# Patient Record
Sex: Female | Born: 1965
Health system: Southern US, Community
[De-identification: ages and names within clinical notes are randomized; demographics above are authoritative.]

## PROBLEM LIST (undated history)

## (undated) DIAGNOSIS — D649 Anemia, unspecified: Secondary | ICD-10-CM

## (undated) DIAGNOSIS — Z8489 Family history of other specified conditions: Secondary | ICD-10-CM

## (undated) DIAGNOSIS — K219 Gastro-esophageal reflux disease without esophagitis: Secondary | ICD-10-CM

## (undated) DIAGNOSIS — R87619 Unspecified abnormal cytological findings in specimens from cervix uteri: Secondary | ICD-10-CM

## (undated) DIAGNOSIS — Z8669 Personal history of other diseases of the nervous system and sense organs: Secondary | ICD-10-CM

## (undated) DIAGNOSIS — I10 Essential (primary) hypertension: Secondary | ICD-10-CM

## (undated) DIAGNOSIS — I2510 Atherosclerotic heart disease of native coronary artery without angina pectoris: Secondary | ICD-10-CM

## (undated) DIAGNOSIS — I213 ST elevation (STEMI) myocardial infarction of unspecified site: Secondary | ICD-10-CM

## (undated) DIAGNOSIS — Z860100 Personal history of colon polyps, unspecified: Secondary | ICD-10-CM

## (undated) DIAGNOSIS — E785 Hyperlipidemia, unspecified: Secondary | ICD-10-CM

## (undated) DIAGNOSIS — Z8619 Personal history of other infectious and parasitic diseases: Secondary | ICD-10-CM

## (undated) HISTORY — PX: TOE SURGERY: SHX1073

## (undated) HISTORY — DX: Personal history of colon polyps, unspecified: Z86.0100

## (undated) HISTORY — PX: SKIN LESION EXCISION: SHX2412

## (undated) HISTORY — DX: Unspecified abnormal cytological findings in specimens from cervix uteri: R87.619

## (undated) HISTORY — DX: Personal history of other infectious and parasitic diseases: Z86.19

## (undated) HISTORY — DX: Essential (primary) hypertension: I10

## (undated) HISTORY — DX: Personal history of other diseases of the nervous system and sense organs: Z86.69

## (undated) HISTORY — DX: Personal history of colonic polyps: Z86.010

## (undated) HISTORY — PX: BREAST BIOPSY: SHX20

## (undated) HISTORY — DX: Hyperlipidemia, unspecified: E78.5

---

## 1987-12-27 DIAGNOSIS — Z8619 Personal history of other infectious and parasitic diseases: Secondary | ICD-10-CM

## 1987-12-27 HISTORY — DX: Personal history of other infectious and parasitic diseases: Z86.19

## 1999-08-31 ENCOUNTER — Other Ambulatory Visit: Admission: RE | Admit: 1999-08-31 | Discharge: 1999-08-31 | Payer: Self-pay | Admitting: Obstetrics and Gynecology

## 2000-09-18 ENCOUNTER — Other Ambulatory Visit: Admission: RE | Admit: 2000-09-18 | Discharge: 2000-09-18 | Payer: Self-pay | Admitting: Obstetrics and Gynecology

## 2000-12-15 ENCOUNTER — Other Ambulatory Visit: Admission: RE | Admit: 2000-12-15 | Discharge: 2000-12-15 | Payer: Self-pay | Admitting: Obstetrics and Gynecology

## 2001-01-11 ENCOUNTER — Encounter (INDEPENDENT_AMBULATORY_CARE_PROVIDER_SITE_OTHER): Payer: Self-pay | Admitting: Specialist

## 2001-01-11 ENCOUNTER — Other Ambulatory Visit: Admission: RE | Admit: 2001-01-11 | Discharge: 2001-01-11 | Payer: Self-pay | Admitting: Obstetrics and Gynecology

## 2001-09-18 ENCOUNTER — Other Ambulatory Visit: Admission: RE | Admit: 2001-09-18 | Discharge: 2001-09-18 | Payer: Self-pay | Admitting: Obstetrics and Gynecology

## 2002-06-04 ENCOUNTER — Other Ambulatory Visit: Admission: RE | Admit: 2002-06-04 | Discharge: 2002-06-04 | Payer: Self-pay | Admitting: Obstetrics and Gynecology

## 2002-09-23 ENCOUNTER — Other Ambulatory Visit: Admission: RE | Admit: 2002-09-23 | Discharge: 2002-09-23 | Payer: Self-pay | Admitting: Obstetrics and Gynecology

## 2003-04-18 ENCOUNTER — Other Ambulatory Visit: Admission: RE | Admit: 2003-04-18 | Discharge: 2003-04-18 | Payer: Self-pay | Admitting: Obstetrics and Gynecology

## 2003-09-26 ENCOUNTER — Other Ambulatory Visit: Admission: RE | Admit: 2003-09-26 | Discharge: 2003-09-26 | Payer: Self-pay | Admitting: Obstetrics and Gynecology

## 2004-10-04 ENCOUNTER — Other Ambulatory Visit: Admission: RE | Admit: 2004-10-04 | Discharge: 2004-10-04 | Payer: Self-pay | Admitting: Obstetrics and Gynecology

## 2005-12-16 ENCOUNTER — Other Ambulatory Visit: Admission: RE | Admit: 2005-12-16 | Discharge: 2005-12-16 | Payer: Self-pay | Admitting: Obstetrics and Gynecology

## 2007-03-23 ENCOUNTER — Other Ambulatory Visit: Admission: RE | Admit: 2007-03-23 | Discharge: 2007-03-23 | Payer: Self-pay | Admitting: Obstetrics and Gynecology

## 2008-02-18 ENCOUNTER — Other Ambulatory Visit: Admission: RE | Admit: 2008-02-18 | Discharge: 2008-02-18 | Payer: Self-pay | Admitting: Obstetrics and Gynecology

## 2008-02-29 ENCOUNTER — Ambulatory Visit (HOSPITAL_COMMUNITY): Admission: RE | Admit: 2008-02-29 | Discharge: 2008-02-29 | Payer: Self-pay | Admitting: Obstetrics and Gynecology

## 2008-08-13 ENCOUNTER — Ambulatory Visit (HOSPITAL_COMMUNITY): Admission: RE | Admit: 2008-08-13 | Discharge: 2008-08-13 | Payer: Self-pay | Admitting: Obstetrics and Gynecology

## 2009-02-26 ENCOUNTER — Other Ambulatory Visit: Admission: RE | Admit: 2009-02-26 | Discharge: 2009-02-26 | Payer: Self-pay | Admitting: Obstetrics and Gynecology

## 2009-08-14 ENCOUNTER — Ambulatory Visit (HOSPITAL_COMMUNITY): Admission: RE | Admit: 2009-08-14 | Discharge: 2009-08-14 | Payer: Self-pay | Admitting: Obstetrics and Gynecology

## 2009-11-10 ENCOUNTER — Ambulatory Visit (HOSPITAL_COMMUNITY): Admission: RE | Admit: 2009-11-10 | Discharge: 2009-11-10 | Payer: Self-pay | Admitting: Gastroenterology

## 2009-11-10 ENCOUNTER — Encounter (INDEPENDENT_AMBULATORY_CARE_PROVIDER_SITE_OTHER): Payer: Self-pay | Admitting: Gastroenterology

## 2010-03-05 LAB — HM PAP SMEAR: HM Pap smear: NEGATIVE

## 2010-09-03 ENCOUNTER — Ambulatory Visit (HOSPITAL_COMMUNITY): Admission: RE | Admit: 2010-09-03 | Discharge: 2010-09-03 | Payer: Self-pay | Admitting: Obstetrics and Gynecology

## 2011-10-04 ENCOUNTER — Other Ambulatory Visit: Payer: Self-pay | Admitting: Obstetrics and Gynecology

## 2011-10-04 DIAGNOSIS — Z1231 Encounter for screening mammogram for malignant neoplasm of breast: Secondary | ICD-10-CM

## 2011-10-21 ENCOUNTER — Ambulatory Visit (HOSPITAL_COMMUNITY)
Admission: RE | Admit: 2011-10-21 | Discharge: 2011-10-21 | Disposition: A | Discharge status: Home / Self Care | Payer: 59 | Source: Ambulatory Visit | Attending: Obstetrics and Gynecology | Admitting: Obstetrics and Gynecology

## 2011-10-21 DIAGNOSIS — Z1231 Encounter for screening mammogram for malignant neoplasm of breast: Secondary | ICD-10-CM | POA: Insufficient documentation

## 2012-01-27 HISTORY — PX: OTHER SURGICAL HISTORY: SHX169

## 2012-01-27 HISTORY — PX: HYSTEROSCOPY: SHX211

## 2012-02-01 ENCOUNTER — Encounter (HOSPITAL_COMMUNITY): Payer: Self-pay | Admitting: *Deleted

## 2012-02-06 ENCOUNTER — Encounter (HOSPITAL_COMMUNITY): Payer: Self-pay

## 2012-02-20 NOTE — H&P (Signed)
Carmen Edwards is an 46 y.o. female.   Chief Complaint: Heavy menses HPI: 46 yo [name] with menorrhagia, uncontrolled on POP (on POP rather that COC due to HTN) PUS/SHSG shows a 9 x 5 x 6 cm uterus with 3 fibroids, 2, 2, and 1 cm, but there is a 3 cm submucous fibroid shown on SHSG.  Pt was placed on Megace 20 mg po qd after the USG to try to control the bleeding until resection could be scheduled.  Past Medical History  Diagnosis Date  . Hypertension     Past Surgical History  Procedure Date  . Toe surgery     toe nail removed    No family history on file. Social History:  reports that she has been passively smoking.  She does not have any smokeless tobacco history on file. She reports that she does not drink alcohol or use illicit drugs.  Allergies:  Allergies  Allergen Reactions  . Amoxicillin Nausea And Vomiting    No current facility-administered medications on file as of .   No current outpatient prescriptions on file as of .    No results found for this or any previous visit (from the past 48 hour(s)). No results found.  Review of Systems  All other systems reviewed and are negative.    There were no vitals taken for this visit. Physical Exam  Constitutional: She is oriented to person, place, and time. She appears well-developed and well-nourished.  HENT:  Head: Normocephalic and atraumatic.  Eyes: Conjunctivae and EOM are normal.  Neck: Normal range of motion. Neck supple. No thyromegaly present.  Cardiovascular: Normal rate, regular rhythm and normal heart sounds.   Respiratory: Effort normal and breath sounds normal.  GI: Soft. Bowel sounds are normal.  Genitourinary: Vagina normal and uterus normal.       Uterus retroverted, NT, adnexa neg  Musculoskeletal: Normal range of motion.  Neurological: She is alert and oriented to person, place, and time.  Skin: Skin is warm and dry.  Psychiatric: She has a normal mood and affect.      Assessment/Plan Menorrhagia. Hysteroscopic resection of submucous fibroid, D & C   Carmen Edwards P 02/20/2012, 2:34 PM

## 2012-02-21 ENCOUNTER — Encounter (HOSPITAL_COMMUNITY): Payer: Self-pay | Admitting: Anesthesiology

## 2012-02-21 ENCOUNTER — Ambulatory Visit (HOSPITAL_COMMUNITY)
Admission: RE | Admit: 2012-02-21 | Discharge: 2012-02-21 | Disposition: A | Discharge status: Home / Self Care | Payer: 59 | Source: Ambulatory Visit | Attending: Obstetrics and Gynecology | Admitting: Obstetrics and Gynecology

## 2012-02-21 ENCOUNTER — Ambulatory Visit (HOSPITAL_COMMUNITY): Payer: 59 | Admitting: Anesthesiology

## 2012-02-21 ENCOUNTER — Encounter (HOSPITAL_COMMUNITY)
Admission: RE | Disposition: A | Discharge status: Home / Self Care | Payer: Self-pay | Source: Ambulatory Visit | Attending: Obstetrics and Gynecology

## 2012-02-21 ENCOUNTER — Encounter (HOSPITAL_COMMUNITY): Payer: Self-pay | Admitting: *Deleted

## 2012-02-21 DIAGNOSIS — N920 Excessive and frequent menstruation with regular cycle: Secondary | ICD-10-CM | POA: Insufficient documentation

## 2012-02-21 DIAGNOSIS — D250 Submucous leiomyoma of uterus: Secondary | ICD-10-CM | POA: Insufficient documentation

## 2012-02-21 LAB — BASIC METABOLIC PANEL
BUN: 8 mg/dL (ref 6–23)
Calcium: 9.5 mg/dL (ref 8.4–10.5)
Creatinine, Ser: 0.68 mg/dL (ref 0.50–1.10)
GFR calc non Af Amer: >90 mL/min (ref >90)
Glucose, Bld: 98 mg/dL (ref 70–99)

## 2012-02-21 LAB — CBC
HCT: 41.5 % (ref 36.0–46.0)
MCHC: 32.3 g/dL (ref 30.0–36.0)
MCV: 88.1 fL (ref 78.0–100.0)
RDW: 13.8 % (ref 11.5–15.5)

## 2012-02-21 SURGERY — DILATATION & CURETTAGE/HYSTEROSCOPY WITH VERSAPOINT RESECTION
Anesthesia: General | Site: Uterus | Wound class: Clean Contaminated

## 2012-02-21 MED ORDER — MORPHINE SULFATE 4 MG/ML IJ SOLN: 1.0000 mg | INTRAMUSCULAR | Status: DC | PRN

## 2012-02-21 MED ORDER — GLYCOPYRROLATE 0.2 MG/ML IJ SOLN
INTRAMUSCULAR | Status: AC
  Filled 2012-02-21: qty 1

## 2012-02-21 MED ORDER — ONDANSETRON HCL 4 MG/2ML IJ SOLN: 4.0000 mg | Freq: Four times a day (QID) | INTRAMUSCULAR | Status: DC | PRN

## 2012-02-21 MED ORDER — PROPOFOL 10 MG/ML IV EMUL
INTRAVENOUS | Status: DC | PRN
  Administered 2012-02-21: 200 mg via INTRAVENOUS

## 2012-02-21 MED ORDER — SODIUM CHLORIDE 0.9 % IJ SOLN: 3.0000 mL | Freq: Two times a day (BID) | INTRAMUSCULAR | Status: DC

## 2012-02-21 MED ORDER — SODIUM CHLORIDE 0.9 % IR SOLN
Status: DC | PRN
  Administered 2012-02-21: 12000 mL

## 2012-02-21 MED ORDER — FENTANYL CITRATE 0.05 MG/ML IJ SOLN
INTRAMUSCULAR | Status: AC
  Filled 2012-02-21: qty 2

## 2012-02-21 MED ORDER — LIDOCAINE HCL (CARDIAC) 20 MG/ML IV SOLN
INTRAVENOUS | Status: AC
  Filled 2012-02-21: qty 5

## 2012-02-21 MED ORDER — DEXAMETHASONE SODIUM PHOSPHATE 4 MG/ML IJ SOLN
INTRAMUSCULAR | Status: DC | PRN
  Administered 2012-02-21: 6 mg via INTRAVENOUS

## 2012-02-21 MED ORDER — ONDANSETRON HCL 4 MG/2ML IJ SOLN
INTRAMUSCULAR | Status: AC
  Filled 2012-02-21: qty 2

## 2012-02-21 MED ORDER — ACETAMINOPHEN 325 MG PO TABS: 650.0000 mg | ORAL_TABLET | ORAL | Status: DC | PRN

## 2012-02-21 MED ORDER — LIDOCAINE HCL (CARDIAC) 20 MG/ML IV SOLN
INTRAVENOUS | Status: DC | PRN
  Administered 2012-02-21: 80 mg via INTRAVENOUS

## 2012-02-21 MED ORDER — DEXAMETHASONE SODIUM PHOSPHATE 10 MG/ML IJ SOLN
INTRAMUSCULAR | Status: AC
  Filled 2012-02-21: qty 1

## 2012-02-21 MED ORDER — FENTANYL CITRATE 0.05 MG/ML IJ SOLN: 25.0000 ug | INTRAMUSCULAR | Status: DC | PRN

## 2012-02-21 MED ORDER — FENTANYL CITRATE 0.05 MG/ML IJ SOLN
INTRAMUSCULAR | Status: DC | PRN
  Administered 2012-02-21: 100 ug via INTRAVENOUS
  Administered 2012-02-21 (×4): 25 ug via INTRAVENOUS

## 2012-02-21 MED ORDER — MIDAZOLAM HCL 5 MG/5ML IJ SOLN
INTRAMUSCULAR | Status: DC | PRN
  Administered 2012-02-21: 2 mg via INTRAVENOUS

## 2012-02-21 MED ORDER — GLYCOPYRROLATE 0.2 MG/ML IJ SOLN
INTRAMUSCULAR | Status: DC | PRN
  Administered 2012-02-21: 0.2 mg via INTRAVENOUS

## 2012-02-21 MED ORDER — PROPOFOL 10 MG/ML IV EMUL
INTRAVENOUS | Status: AC
  Filled 2012-02-21: qty 20

## 2012-02-21 MED ORDER — LIDOCAINE HCL 1 % IJ SOLN
INTRAMUSCULAR | Status: DC | PRN
  Administered 2012-02-21: 20 mL

## 2012-02-21 MED ORDER — LACTATED RINGERS IV SOLN
INTRAVENOUS | Status: DC
  Administered 2012-02-21 (×3): via INTRAVENOUS

## 2012-02-21 MED ORDER — ACETAMINOPHEN 650 MG RE SUPP: 650.0000 mg | RECTAL | Status: DC | PRN

## 2012-02-21 MED ORDER — ONDANSETRON HCL 4 MG/2ML IJ SOLN
INTRAMUSCULAR | Status: DC | PRN
  Administered 2012-02-21: 4 mg via INTRAVENOUS

## 2012-02-21 MED ORDER — PROMETHAZINE HCL 25 MG/ML IJ SOLN: 12.5000 mg | Freq: Four times a day (QID) | INTRAMUSCULAR | Status: DC | PRN

## 2012-02-21 MED ORDER — KETOROLAC TROMETHAMINE 30 MG/ML IJ SOLN
INTRAMUSCULAR | Status: DC | PRN
  Administered 2012-02-21: 30 mg via INTRAVENOUS

## 2012-02-21 MED ORDER — MIDAZOLAM HCL 2 MG/2ML IJ SOLN
INTRAMUSCULAR | Status: AC
  Filled 2012-02-21: qty 2

## 2012-02-21 MED ORDER — OXYCODONE HCL 5 MG PO TABS: 5.0000 mg | ORAL_TABLET | ORAL | Status: DC | PRN

## 2012-02-21 MED ORDER — KETOROLAC TROMETHAMINE 30 MG/ML IJ SOLN
INTRAMUSCULAR | Status: AC
  Filled 2012-02-21: qty 1

## 2012-02-21 SURGICAL SUPPLY — 21 items
CANISTER SUCTION 2500CC (MISCELLANEOUS) ×6 IMPLANT
CATH ROBINSON RED A/P 16FR (CATHETERS) ×2 IMPLANT
CONTAINER PREFILL 10% NBF 60ML (FORM) ×4 IMPLANT
CORD ACTIVE DISPOSABLE (ELECTRODE)
CORD ELECTRO ACTIVE DISP (ELECTRODE) IMPLANT
ELECT LOOP GYNE PRO 24FR (CUTTING LOOP)
ELECT REM PT RETURN 9FT ADLT (ELECTROSURGICAL) ×2
ELECT VAPORTRODE GRVD BAR (ELECTRODE) IMPLANT
ELECTRODE LOOP GYNE PRO 24FR (CUTTING LOOP) IMPLANT
ELECTRODE REM PT RTRN 9FT ADLT (ELECTROSURGICAL) ×1 IMPLANT
ELECTRODE ROLLER VERSAPOINT (ELECTRODE) ×1 IMPLANT
ELECTRODE RT ANGLE VERSAPOINT (CUTTING LOOP) IMPLANT
GLOVE BIOGEL PI IND STRL 7.0 (GLOVE) ×2 IMPLANT
GLOVE BIOGEL PI INDICATOR 7.0 (GLOVE) ×2
GLOVE ECLIPSE 6.5 STRL STRAW (GLOVE) ×2 IMPLANT
GOWN PREVENTION PLUS LG XLONG (DISPOSABLE) ×2 IMPLANT
GOWN STRL REIN XL XLG (GOWN DISPOSABLE) ×2 IMPLANT
PACK HYSTEROSCOPY LF (CUSTOM PROCEDURE TRAY) ×2 IMPLANT
SUT CHROMIC 2 0 SH (SUTURE) ×1 IMPLANT
TOWEL OR 17X24 6PK STRL BLUE (TOWEL DISPOSABLE) ×4 IMPLANT
WATER STERILE IRR 1000ML POUR (IV SOLUTION) ×2 IMPLANT

## 2012-02-21 NOTE — Transfer of Care (Signed)
Immediate Anesthesia Transfer of Care Note  Patient: Carmen Edwards  Procedure(s) Performed: Procedure(s) (LRB): DILATATION & CURETTAGE/HYSTEROSCOPY WITH VERSAPOINT RESECTION (N/A)  Patient Location: PACU  Anesthesia Type: MAC and General  Level of Consciousness: alert  and oriented  Airway & Oxygen Therapy: Patient Spontanous Breathing and Patient connected to nasal cannula oxygen  Post-op Assessment: Report given to PACU RN and Post -op Vital signs reviewed and stable  Post vital signs: Reviewed  Complications: No apparent anesthesia complications

## 2012-02-21 NOTE — Anesthesia Procedure Notes (Signed)
Procedure Name: LMA Insertion Date/Time: 02/21/2012 10:33 AM Performed by: Carlyle Lipa Pre-anesthesia Checklist: Patient identified, Patient being monitored, Suction available and Emergency Drugs available Patient Re-evaluated:Patient Re-evaluated prior to inductionOxygen Delivery Method: Circle system utilized and Simple face mask Preoxygenation: Pre-oxygenation with 100% oxygen Intubation Type: IV induction Ventilation: Mask ventilation without difficulty Number of attempts: 1 Placement Confirmation: positive ETCO2 and breath sounds checked- equal and bilateral Tube secured with: Tape Dental Injury: Teeth and Oropharynx as per pre-operative assessment

## 2012-02-21 NOTE — Anesthesia Postprocedure Evaluation (Signed)
  Anesthesia Post-op Note  Patient: Carmen Edwards  Procedure(s) Performed: Procedure(s) (LRB): DILATATION & CURETTAGE/HYSTEROSCOPY WITH VERSAPOINT RESECTION (N/A)  Patient is awake and responsive. Pain and nausea are reasonably well controlled. Vital signs are stable and clinically acceptable. Oxygen saturation is clinically acceptable. There are no apparent anesthetic complications at this time. Patient is ready for discharge.

## 2012-02-21 NOTE — Anesthesia Preprocedure Evaluation (Addendum)
Anesthesia Evaluation    Airway       Dental   Pulmonary          Cardiovascular hypertension (well controlled, no CAD sx), On Home Beta Blockers     Neuro/Psych    GI/Hepatic   Endo/Other  Morbid obesity  Renal/GU      Musculoskeletal   Abdominal   Peds  Hematology   Anesthesia Other Findings   Reproductive/Obstetrics                          Anesthesia Physical Anesthesia Plan  ASA: III  Anesthesia Plan: General   Post-op Pain Management:    Induction: Intravenous  Airway Management Planned: LMA  Additional Equipment:   Intra-op Plan:   Post-operative Plan:   Informed Consent: I have reviewed the patients History and Physical, chart, labs and discussed the procedure including the risks, benefits and alternatives for the proposed anesthesia with the patient or authorized representative who has indicated his/her understanding and acceptance.   Dental Advisory Given  Plan Discussed with: CRNA and Surgeon  Anesthesia Plan Comments: (  Discussed  general anesthesia, including possible nausea, instrumentation of airway, sore throat,pulmonary aspiration, etc. I asked if the were any outstanding questions, or  concerns before we proceeded. )        Anesthesia Quick Evaluation

## 2012-02-21 NOTE — Interval H&P Note (Signed)
History and Physical Interval Note:  02/21/2012 10:11 AM  Carmen Edwards  has presented today for surgery, with the diagnosis of menorrhagia and fibroid  The various methods of treatment have been discussed with the patient and family. After consideration of risks, benefits and other options for treatment, the patient has consented to  Procedure(s) (LRB): DILATATION & CURETTAGE/HYSTEROSCOPY WITH VERSAPOINT RESECTION (N/A) as a surgical intervention .  The patients' history has been reviewed, patient examined, no change in status, stable for surgery.  I have reviewed the patients' chart and labs.  Questions were answered to the patient's satisfaction.     Lark Runk P

## 2012-02-21 NOTE — Op Note (Signed)
Preoperative diagnosis: Menorrhagia, submucous fibroid Postoperative diagnosis: Same, path pending Procedure: Hysteroscopic resection of submucous myoma, D&C Surgeon: Dr. Aram Beecham Romine Anesthesia: Gen. by LMA Estimated blood loss: 50 cc Estimated saline deficit: 650 cc Complications: None Procedure: The patient was taken to the operating room and after induction of adequate general anesthesia was placed in the dorsolithotomy position and prepped and draped in usual fashion.  The bladder was drained with a red rubber catheter.  A posterior weighted and anterior sims retractor were placed, and the cervix was grasped on its anterior lip with a single-tooth tenaculum.  Paracervical block was instituted by injecting 10 cc of 1% Xylocaine at each of 3 and 9:00.  The uterus sounded to 8 cm.  The cervix was dilated to #31 Shawnie Pons.  The VersaPoint hysteroscope with a desiccating attachment was introduced into the cavity.  There was a large submucous myoma practically filling the cavity.  It had measured approximately 3 cm on sonohysterogram.  The VersaPoint was used to desiccate approximately half of the fibroid.  At this point it was felt it would be preferable to treat remove the remainder of the fibroid with a regular cutting loop.  The hysteroscope was removed and a cauterizing and cutting loop was placed in the VersaPoint hysteroscope was reintroduced into the cavity.  A single loop cautery was used to remove the remainder of this large fibroid.  Photographic documentation was taken of the cavity after resection.  The scope was then withdrawn, and a gentle sharp curettage was done.  The specimen was sent separately to pathology.  The instruments were removed from the vagina.  There had been a 10 on the posterior lip of the cervix where I had applied a tenaculum, and this was sutured for hemostasis with a 2-0 chromic suture.  Hemostasis was obtained, and the instruments were all removed and the vagina.  The  procedure was terminated.  Sponge needle and instrument counts were correct x3.  The patient was taken to the recovery room in satisfactory condition.

## 2012-08-21 ENCOUNTER — Encounter (HOSPITAL_COMMUNITY): Payer: Self-pay

## 2012-08-21 ENCOUNTER — Emergency Department (HOSPITAL_COMMUNITY)
Admission: EM | Admit: 2012-08-21 | Discharge: 2012-08-21 | Disposition: A | Discharge status: Home / Self Care | Payer: 59 | Attending: Emergency Medicine | Admitting: Emergency Medicine

## 2012-08-21 ENCOUNTER — Emergency Department (HOSPITAL_COMMUNITY): Payer: 59

## 2012-08-21 DIAGNOSIS — E7800 Pure hypercholesterolemia, unspecified: Secondary | ICD-10-CM | POA: Insufficient documentation

## 2012-08-21 DIAGNOSIS — R079 Chest pain, unspecified: Secondary | ICD-10-CM | POA: Insufficient documentation

## 2012-08-21 DIAGNOSIS — F17200 Nicotine dependence, unspecified, uncomplicated: Secondary | ICD-10-CM | POA: Insufficient documentation

## 2012-08-21 DIAGNOSIS — I10 Essential (primary) hypertension: Secondary | ICD-10-CM | POA: Insufficient documentation

## 2012-08-21 LAB — COMPREHENSIVE METABOLIC PANEL
AST: 24 U/L (ref 0–37)
Albumin: 3.9 g/dL (ref 3.5–5.2)
Alkaline Phosphatase: 89 U/L (ref 39–117)
BUN: 8 mg/dL (ref 6–23)
CO2: 25 mEq/L (ref 19–32)
Sodium: 138 mEq/L (ref 135–145)
Total Bilirubin: 0.6 mg/dL (ref 0.3–1.2)
Total Protein: 7.2 g/dL (ref 6.0–8.3)

## 2012-08-21 LAB — CBC WITH DIFFERENTIAL/PLATELET
Eosinophils Absolute: 0.1 10*3/uL (ref 0.0–0.7)
Eosinophils Relative: 1 % (ref 0–5)
HCT: 43.1 % (ref 36.0–46.0)
Lymphocytes Relative: 18 % (ref 12–46)
Lymphs Abs: 2.1 10*3/uL (ref 0.7–4.0)
MCH: 31.1 pg (ref 26.0–34.0)
MCV: 90 fL (ref 78.0–100.0)
Monocytes Absolute: 1 10*3/uL (ref 0.1–1.0)
RBC: 4.79 MIL/uL (ref 3.87–5.11)
RDW: 13.5 % (ref 11.5–15.5)
WBC: 12.1 10*3/uL — ABNORMAL HIGH (ref 4.0–10.5)

## 2012-08-21 MED ORDER — KETOROLAC TROMETHAMINE 60 MG/2ML IM SOLN
60.0000 mg | Freq: Once | INTRAMUSCULAR | Status: AC
  Administered 2012-08-21: 60 mg via INTRAMUSCULAR
  Filled 2012-08-21: qty 2

## 2012-08-21 MED ORDER — GI COCKTAIL ~~LOC~~
30.0000 mL | Freq: Once | ORAL | Status: AC
  Administered 2012-08-21: 30 mL via ORAL
  Filled 2012-08-21: qty 30

## 2012-08-21 MED ORDER — TRAMADOL HCL 50 MG PO TABS: 50.0000 mg | ORAL_TABLET | Freq: Four times a day (QID) | ORAL | Status: AC | PRN

## 2012-08-21 NOTE — ED Notes (Signed)
Pt presents to department for evaluation of midsternal non radiating chest pain. Onset last night @ 8:00pm at home. States 3/10 dull constant pain at the time. Becomes worse when lying down. Also states cold sweats and difficulty sleeping. Denies SOB. Respirations unlabored. Lung sounds clear and equal bilaterally. She is conscious alert and oriented x4.

## 2012-08-21 NOTE — ED Notes (Signed)
Pt discharged home. Encouraged to follow up with PCP if symptoms persist. Had no further questions.

## 2012-08-21 NOTE — ED Provider Notes (Addendum)
History     CSN: [id number]  Arrival date & time 08/21/12  0801   First MD Initiated Contact with Patient 08/21/12 [id number]      Chief Complaint  Patient presents with  . Chest Pain    (Consider location/radiation/quality/duration/timing/severity/associated sxs/prior treatment) Patient is a 46 y.o. female presenting with chest pain. The history is provided by the patient.  Chest Pain Pertinent negatives for primary symptoms include no fever, no shortness of breath, no cough, no palpitations, no nausea and no vomiting.  Pertinent negatives for associated symptoms include no diaphoresis.    46 year old, female, with a history of hypertension, and hypercholesterolemia, presents to emergency department complaining of aching substernal chest pain.  Since yesterday.  Her symptoms began while she was sitting down.  Her symptoms have been constant since yesterday.  The pain does not radiate.  It is not associated with shortness of breath, nausea, vomiting, or sweating.  She denies cough, fevers, chills.  She denies leg pain or swelling.  She states when she walks.  She gets winded.  However, she denies chest pain with ambulation.  She denies change in the pain.  If she eats food.  She does feel as if the pain increases when she lies down and improves when she sits up. She does not smoke cigarettes.  Her father has had a bypass, and several heart attacks, when he was in his 35s.  Past Medical History  Diagnosis Date  . Hypertension   . High cholesterol     Past Surgical History  Procedure Date  . Toe surgery     toe nail removed    No family history on file.  History  Substance Use Topics  . Smoking status: Passive Smoker  . Smokeless tobacco: Not on file  . Alcohol Use: No    OB History    Grav Para Term Preterm Abortions TAB SAB Ect Mult Living                  Review of Systems  Constitutional: Negative for fever, chills and diaphoresis.  HENT: Negative for neck pain.     Respiratory: Negative for cough, chest tightness and shortness of breath.   Cardiovascular: Positive for chest pain. Negative for palpitations and leg swelling.  Gastrointestinal: Negative for nausea and vomiting.  Neurological: Negative for headaches.  Psychiatric/Behavioral: Negative for confusion.  All other systems reviewed and are negative.    Allergies  Amoxicillin  Home Medications   Current Outpatient Rx  Name Route Sig Dispense Refill  . ATENOLOL 50 MG PO TABS Oral Take 50 mg by mouth daily.    . ATORVASTATIN CALCIUM 10 MG PO TABS Oral Take 5 mg by mouth daily.     . ADULT MULTIVITAMIN W/MINERALS CH Oral Take 1 tablet by mouth daily.    . NORETHINDRONE 0.35 MG PO TABS Oral Take 1 tablet by mouth daily.      BP 118/74  Pulse 62  Temp 98.1 F (36.7 C) (Oral)  Resp 18  SpO2 97%  LMP 08/04/2012  Physical Exam  Nursing note and vitals reviewed. Constitutional: She is oriented to person, place, and time. She appears well-developed and well-nourished. No distress.  HENT:  Head: Normocephalic and atraumatic.  Eyes: Conjunctivae are normal.  Neck: Normal range of motion. Neck supple.       No bruits  Cardiovascular: Normal rate and regular rhythm.   No murmur heard. Pulmonary/Chest: Effort normal and breath sounds normal. She has no rales.  Abdominal: Soft. Bowel sounds are normal. There is no tenderness. There is no guarding.  Musculoskeletal: Normal range of motion. She exhibits no edema.  Neurological: She is alert and oriented to person, place, and time.  Skin: Skin is warm and dry.  Psychiatric: She has a normal mood and affect. Thought content normal.    ED Course  Procedures (including critical care time) 46 year old, female, with hypertension, and hyper cholesterolemia, presents with an aching left-sided chest pain, which has been constant since yesterday.  She has no other symptoms associated with the pain does increase when she lies down and improves when  she sits up.  Her physical examination is unremarkable.  We will perform a chest x-ray, and laboratory testing, and EKG, for evaluation.  Labs Reviewed  CBC WITH DIFFERENTIAL - Abnormal; Notable for the following:    WBC 12.1 (*)     Platelets 421 (*)     Neutro Abs 8.8 (*)     All other components within normal limits  COMPREHENSIVE METABOLIC PANEL  POCT I-STAT TROPONIN I   Dg Chest 2 View  08/21/2012  *RADIOLOGY REPORT*  Clinical Data: Shortness of breath, chest pain  CHEST - 2 VIEW  Comparison: None.  Findings: Cardiomediastinal silhouette is within normal limits. The lungs are clear. No pleural effusion.  No pneumothorax.  No acute osseous abnormality.  IMPRESSION: Normal chest.   Original Report Authenticated By: Harrel Lemon, M.D.      No diagnosis found.   Date: 08/21/2012  Rate: 77  Rhythm: normal sinus rhythm  QRS Axis: normal  Intervals: normal  ST/T Wave abnormalities: normal  Conduction Disutrbances: none  Narrative Interpretation: unremarkable  10:50 AM Feels better. Cp resolved.  She says it was dissipating even before she got the meds.   MDM  Chest pain        Cheri Guppy, MD 08/21/12 1610  Cheri Guppy, MD 08/21/12 1051

## 2012-08-21 NOTE — ED Notes (Signed)
Pt here with chest pain, onset last pm at 2000, sts dull and constant, sub sternal, no releiving or worsening factors. Pt denies n/v, sob or weakness, sts did have cold sweats. Family has heart hx.

## 2012-11-29 ENCOUNTER — Other Ambulatory Visit: Payer: Self-pay | Admitting: Obstetrics and Gynecology

## 2012-11-29 DIAGNOSIS — Z1231 Encounter for screening mammogram for malignant neoplasm of breast: Secondary | ICD-10-CM

## 2012-12-20 ENCOUNTER — Ambulatory Visit (HOSPITAL_COMMUNITY)
Admission: RE | Admit: 2012-12-20 | Discharge: 2012-12-20 | Disposition: A | Discharge status: Home / Self Care | Payer: 59 | Source: Ambulatory Visit | Attending: Obstetrics and Gynecology | Admitting: Obstetrics and Gynecology

## 2012-12-20 DIAGNOSIS — Z1231 Encounter for screening mammogram for malignant neoplasm of breast: Secondary | ICD-10-CM | POA: Insufficient documentation

## 2012-12-26 LAB — HM COLONOSCOPY

## 2013-04-01 ENCOUNTER — Ambulatory Visit: Payer: Self-pay | Admitting: Obstetrics and Gynecology

## 2013-04-01 ENCOUNTER — Encounter: Payer: Self-pay | Admitting: Obstetrics and Gynecology

## 2013-04-01 ENCOUNTER — Ambulatory Visit (INDEPENDENT_AMBULATORY_CARE_PROVIDER_SITE_OTHER): Payer: 59 | Admitting: Obstetrics and Gynecology

## 2013-04-01 VITALS — BP 112/60 | Ht 64.75 in | Wt 216.0 lb

## 2013-04-01 DIAGNOSIS — Z0000 Encounter for general adult medical examination without abnormal findings: Secondary | ICD-10-CM

## 2013-04-01 DIAGNOSIS — Z01419 Encounter for gynecological examination (general) (routine) without abnormal findings: Secondary | ICD-10-CM

## 2013-04-01 LAB — POCT URINALYSIS DIPSTICK
Bilirubin, UA: NEGATIVE
Glucose, UA: NEGATIVE
Leukocytes, UA: NEGATIVE
Nitrite, UA: NEGATIVE
Urobilinogen, UA: NEGATIVE

## 2013-04-01 MED ORDER — NORETHINDRONE 0.35 MG PO TABS: 1.0000 | ORAL_TABLET | Freq: Every day | ORAL | Status: DC

## 2013-04-01 NOTE — Patient Instructions (Signed)

## 2013-04-01 NOTE — Progress Notes (Signed)
47 y.o.  Married  Caucasian female   G2P1011 here for annual exam.   Menses light to none.  Still on POP's.  No cramping or heavy bleeding. No headaches.    Patient's last menstrual period was 03/15/2013.          Sexually active: yes  The current method of family planning is OCP (estrogen/progesterone).    Exercising: no Last mammogram:  12/20/12 normal Last pap smear: 03/09/10 neg History of abnormal pap: Yes CIN 2 in 1988 Smoking:yes light smoker, maybe 1/2 pack a year Alcohol:no Last colonoscopy:12/26/2008 removed polyps, recheck in 5years Last Bone Density:  none Last tetanus shot: less than 10years Last cholesterol check: 2013 elevated taking lipitor 5mg  daily  Hgb:    pcp            Urine :neg    Health Maintenance  Topic Date Due  . Pap Smear  04/24/1986  . Tetanus/tdap  04/25/1987  . Influenza Vaccine  08/26/2013    Family History  Problem Relation Age of Onset  . Cancer - Colon Father   . Hypertension Father   . Hyperlipidemia Father   . Heart disease Father   . Hyperlipidemia Mother   . Cancer - Colon Maternal Grandmother   . Heart disease Paternal Grandfather     There is no problem list on file for this patient.   Past Medical History  Diagnosis Date  . Hypertension   . High cholesterol   . Migraine   . Hx of chlamydia infection 1989    Past Surgical History  Procedure Laterality Date  . Toe surgery      toe nail removed  . Hysteroscopy  01/2012    Dr. Tresa Res  . Fibroid resection  01/2012    Dr. Tresa Res    Allergies: Amoxicillin  Current Outpatient Prescriptions  Medication Sig Dispense Refill  . atenolol (TENORMIN) 50 MG tablet Take 50 mg by mouth daily.      Marland Kitchen atorvastatin (LIPITOR) 10 MG tablet Take 5 mg by mouth daily.       . Multiple Vitamin (MULTIVITAMIN WITH MINERALS) TABS Take 1 tablet by mouth once a week.       . norethindrone (JOLIVETTE) 0.35 MG tablet Take 1 tablet by mouth daily.      . Iron TABS Take 1 capsule by mouth daily.        No current facility-administered medications for this visit.    ROS: Pertinent items are noted in HPI.  Exam:    BP 112/60  Ht 5' 4.75" (1.645 m)  Wt 216 lb (97.977 kg)  BMI 36.21 kg/m2  LMP 03/15/2013  Up 11 pounds from last year  Wt Readings from Last 3 Encounters:  04/01/13 216 lb (97.977 kg)  02/21/12 205 lb (92.987 kg)  02/21/12 205 lb (92.987 kg)     Ht Readings from Last 3 Encounters:  04/01/13 5' 4.75" (1.645 m)  02/21/12 5\' 5"  (1.651 m)  02/21/12 5\' 5"  (1.651 m)    General appearance: alert, cooperative and appears stated age Head: Normocephalic, without obvious abnormality, atraumatic Neck: no adenopathy, supple, symmetrical, trachea midline and thyroid not enlarged, symmetric, no tenderness/mass/nodules Lungs: clear to auscultation bilaterally Breasts: Inspection negative, No nipple retraction or dimpling, No nipple discharge or bleeding, No axillary or supraclavicular adenopathy, Normal to palpation without dominant masses Heart: regular rate and rhythm Abdomen: soft, non-tender; bowel sounds normal; no masses,  no organomegaly Extremities: extremities normal, atraumatic, no cyanosis or edema Skin: Skin color,  texture, turgor normal. No rashes or lesions Lymph nodes: Cervical, supraclavicular, and axillary nodes normal. No abnormal inguinal nodes palpated Neurologic: Grossly normal   Pelvic: External genitalia:  no lesions              Urethra:  normal appearing urethra with no masses, tenderness or lesions              Bartholins and Skenes: normal                 Vagina: normal appearing vagina with normal color and discharge, no lesions              Cervix: normal appearance              Pap taken: yes        Bimanual Exam:  Uterus:  uterus is normal size, shape, consistency and nontender                                      Adnexa: normal adnexa in size, nontender and no masses                                      Rectovaginal: Confirms                                       Anus:  normal sphincter tone, no lesions  A: normal gyn exam, POP's d/t HTN     S/p resection submucous myoma 3 cm March 2013     Migraines     H/O CIN 2 in 1988     H/O chlamydia 1989     Thyroid USG in 2009 done d/t clinically enlarged, but USG wnl     P: mammogram pap smear counseled on breast self exam, mammography screening, use and side effects of OCP's, adequate intake of calcium and vitamin D, diet and exercise return annually or prn     An After Visit Summary was printed and given to the patient.

## 2013-07-23 ENCOUNTER — Other Ambulatory Visit (HOSPITAL_COMMUNITY): Payer: Self-pay | Admitting: Gastroenterology

## 2013-07-23 DIAGNOSIS — R1310 Dysphagia, unspecified: Secondary | ICD-10-CM

## 2013-07-25 ENCOUNTER — Other Ambulatory Visit (HOSPITAL_COMMUNITY): Payer: 59

## 2013-07-26 ENCOUNTER — Ambulatory Visit (HOSPITAL_COMMUNITY)
Admission: RE | Admit: 2013-07-26 | Discharge: 2013-07-26 | Disposition: A | Discharge status: Home / Self Care | Payer: 59 | Source: Ambulatory Visit | Attending: Gastroenterology | Admitting: Gastroenterology

## 2013-07-26 DIAGNOSIS — R1310 Dysphagia, unspecified: Secondary | ICD-10-CM

## 2013-07-26 DIAGNOSIS — K219 Gastro-esophageal reflux disease without esophagitis: Secondary | ICD-10-CM | POA: Insufficient documentation

## 2013-07-26 DIAGNOSIS — K449 Diaphragmatic hernia without obstruction or gangrene: Secondary | ICD-10-CM | POA: Insufficient documentation

## 2014-03-18 ENCOUNTER — Other Ambulatory Visit: Payer: Self-pay | Admitting: Obstetrics and Gynecology

## 2014-03-18 NOTE — Telephone Encounter (Signed)
Last AEX 04/01/2013 Last refill 04/01/2013 #90/3 refills Next appt 04/14/2014  Will refill once until appt 04/14/2014.

## 2014-04-14 ENCOUNTER — Ambulatory Visit: Payer: 59 | Admitting: Obstetrics and Gynecology

## 2014-04-18 ENCOUNTER — Telehealth: Payer: Self-pay | Admitting: Obstetrics and Gynecology

## 2014-04-18 NOTE — Telephone Encounter (Signed)
Confirming patients appt

## 2014-04-21 ENCOUNTER — Telehealth: Payer: Self-pay | Admitting: Obstetrics and Gynecology

## 2014-04-21 ENCOUNTER — Other Ambulatory Visit: Payer: Self-pay | Admitting: Obstetrics and Gynecology

## 2014-04-21 DIAGNOSIS — Z1231 Encounter for screening mammogram for malignant neoplasm of breast: Secondary | ICD-10-CM

## 2014-04-21 MED ORDER — NORETHINDRONE 0.35 MG PO TABS: 1.0000 | ORAL_TABLET | Freq: Every day | ORAL | Status: DC

## 2014-04-21 NOTE — Telephone Encounter (Addendum)
Last AEX 04/01/2013 Last refill 02/2014 #28/0 refills  -Rx sent for 3 months. - LM for pt re: Rx sent to pharmacy. Will need to come to her appt for any refills.

## 2014-04-21 NOTE — Telephone Encounter (Signed)
Patient has aex 04/25/14 at 3:00. She is worried that she will not be able to get her rx before they close at 5:30. She will run out on Saturday. Wants to know if we will go ahead an refill the rx for 90 days because it cost her more when getting it 30 days at a time. [facility] and they are not open on weekends.  HEATHER 0.35 MG tablet  TAKE 1 TABLET BY MOUTH DAILY., Normal, Last Dose: Not Recorded  Refills: 0 ordered [facility]: [facility] [place].

## 2014-04-25 ENCOUNTER — Encounter: Payer: Self-pay | Admitting: Obstetrics and Gynecology

## 2014-04-25 ENCOUNTER — Ambulatory Visit (INDEPENDENT_AMBULATORY_CARE_PROVIDER_SITE_OTHER): Payer: 59 | Admitting: Obstetrics and Gynecology

## 2014-04-25 VITALS — BP 118/64 | HR 66 | Ht 65.0 in | Wt 223.2 lb

## 2014-04-25 DIAGNOSIS — Z01419 Encounter for gynecological examination (general) (routine) without abnormal findings: Secondary | ICD-10-CM

## 2014-04-25 DIAGNOSIS — Z0000 Encounter for general adult medical examination without abnormal findings: Secondary | ICD-10-CM

## 2014-04-25 LAB — POCT URINALYSIS DIPSTICK
BILIRUBIN UA: NEGATIVE
Glucose, UA: NEGATIVE
Ketones, UA: NEGATIVE
LEUKOCYTES UA: NEGATIVE
NITRITE UA: NEGATIVE
PH UA: 5
Protein, UA: NEGATIVE
Urobilinogen, UA: NEGATIVE

## 2014-04-25 MED ORDER — NORETHINDRONE 0.35 MG PO TABS: 1.0000 | ORAL_TABLET | Freq: Every day | ORAL | Status: DC

## 2014-04-25 NOTE — Patient Instructions (Signed)

## 2014-04-25 NOTE — Progress Notes (Signed)
Patient ID: Carmen Edwards, female   DOB: 11-16-1966, 48 y.o.   MRN: [id number] GYNECOLOGY VISIT  PCP:  Orpah Cobb, MD Carmen Edwards at [facility]  Referring provider:   HPI: 48 y.o.   Married  Caucasian  female   G2P1011 with Patient's last menstrual period was 04/24/2014.   here for   Annual exam.   Asking for pap .   Would like to stop birth control pills. Declines childbearing.  No problems with periods.   Weight watchers too tiresome.   Gaining weight.   Hgb:  PCP Urine: 2+ blood ( see LMP)   GYNECOLOGIC HISTORY: Patient's last menstrual period was 04/24/2014. Sexually active:  yes Partner preference: female Contraception: OCP's--Jolvette  Menopausal hormone therapy: n/a DES exposure:  no  Blood transfusions:   no Sexually transmitted diseases:  no  GYN procedures and prior surgeries:  Hysteroscopy w/fibroid resection 2013-Dr. Romine. Last mammogram:   11/2012 wnl:[facility].  Scheduled for May 06, 2014.      Last pap and high risk HPV testing:   04-01-13 ascus:neg HR HPV. History of abnormal pap smear:  Hx of CIN II 1988:patient unsure if had cryotherapy to cervix or conization?  LGSIL in 2003 on colposcopic biopsy.    OB History   Grav Para Term Preterm Abortions TAB SAB Ect Mult Living   2 1 1  1 1    1        LIFESTYLE: Exercise:   no            Tobacco:   Smokes 1/2 pack per year Alcohol:    no Drug use:  no  OTHER HEALTH MAINTENANCE: Tetanus/TDap:    Up to date with [facility] employee heatlth Gardisil:                n/a Influenza:               09/2013 Zostavax:                n/a  Bone density:          n/a Colonoscopy:          10/2009 revealed tubular adenomatous polyps.  Dr. Herbie Baltimore Buccini with [facility] recommends next colonoscopy in 5 years(10/2014).  Cholesterol check:  Borderline with PCP  Family History  Problem Relation Age of Onset  . Cancer - Colon Father   . Hypertension Father   . Hyperlipidemia Father   . Heart disease Father    . Hyperlipidemia Mother   . Cancer - Colon Maternal Grandmother   . Heart disease Paternal Grandfather   . Hypertension Brother   . Heart disease Brother 33    heart attack  . Cancer - Colon Maternal Aunt   . Cancer - Colon Maternal Uncle     There are no active problems to display for this patient.  Past Medical History  Diagnosis Date  . Hypertension   . High cholesterol   . Migraine   . Hx of chlamydia infection 1989  . Reflux     Past Surgical History  Procedure Laterality Date  . Toe surgery      toe nail removed  . Hysteroscopy  01/2012    Dr. Joan Flores  . Fibroid resection  01/2012    Dr. Joan Flores  . Skin lesion excision      --actually frozen by dermatology--benign    ALLERGIES: Amoxicillin  Current Outpatient Prescriptions  Medication Sig Dispense Refill  . atenolol (TENORMIN) 50 MG tablet  Take 50 mg by mouth daily.      Marland Kitchen atorvastatin (LIPITOR) 10 MG tablet Take 5 mg by mouth daily.       . norethindrone (HEATHER) 0.35 MG tablet Take 1 tablet (0.35 mg total) by mouth daily.  3 Package  0  . omeprazole (PRILOSEC) 20 MG capsule Take 20 mg by mouth daily.       No current facility-administered medications for this visit.     ROS:  Pertinent items are noted in HPI.  SOCIAL HISTORY:  Used to work for [employer]. Marketing rep now.  PHYSICAL EXAMINATION:    BP 118/64  Pulse 66  Ht 5\' 5"  (1.651 m)  Wt 223 lb 3.2 oz (101.243 kg)  BMI 37.14 kg/m2  LMP 04/24/2014   Wt Readings from Last 3 Encounters:  04/25/14 223 lb 3.2 oz (101.243 kg)  04/01/13 216 lb (97.977 kg)  02/21/12 205 lb (92.987 kg)     Ht Readings from Last 3 Encounters:  04/25/14 5\' 5"  (1.651 m)  04/01/13 5' 4.75" (1.645 m)  02/21/12 5\' 5"  (1.651 m)    General appearance: alert, cooperative and appears stated age Head: Normocephalic, without obvious abnormality, atraumatic Neck: no adenopathy, supple, symmetrical, trachea midline and thyroid not enlarged, symmetric, no  tenderness/mass/nodules Lungs: clear to auscultation bilaterally Breasts: Inspection negative, No nipple retraction or dimpling, No nipple discharge or bleeding, No axillary or supraclavicular adenopathy, Normal to palpation without dominant masses Heart: regular rate and rhythm Abdomen: soft, non-tender; no masses,  no organomegaly Extremities: extremities normal, atraumatic, no cyanosis or edema Skin: Skin color, texture, turgor normal. No rashes or lesions Lymph nodes: Cervical, supraclavicular, and axillary nodes normal. No abnormal inguinal nodes palpated Neurologic: Grossly normal  Pelvic: External genitalia:  no lesions              Urethra:  normal appearing urethra with no masses, tenderness or lesions              Bartholins and Skenes: normal                 Vagina: normal appearing vagina with normal color and discharge, no lesions              Cervix: normal appearance              Pap and high risk HPV testing done: yes.            Bimanual Exam:  Uterus:  uterus is normal size, shape, consistency and nontender                                      Adnexa: normal adnexa in size, nontender and no masses                                      Rectovaginal: Confirms                                      Anus:  normal sphincter tone, no lesions  ASSESSMENT  Normal gynecologic exam. Remote history of CIN II. History of ASCUS, negative HR HPV.  Periodic smoker.   PLAN  Mammogram recommended yearly.  Pap smear and high risk HPV testing performed.  Counseled on self  breast exam, Calcium and vitamin D intake, weight loss through diet and exercise. Refill on progesterone only OCP for one year.  Information to patient about Mirena IUD.  Return annually or prn   An After Visit Summary was printed and given to the patient.

## 2014-05-01 LAB — IPS PAP TEST WITH HPV

## 2014-05-05 ENCOUNTER — Telehealth: Payer: Self-pay | Admitting: Obstetrics and Gynecology

## 2014-05-05 DIAGNOSIS — IMO0002 Reserved for concepts with insufficient information to code with codable children: Secondary | ICD-10-CM

## 2014-05-05 NOTE — Telephone Encounter (Signed)
Left message for patient to call back. Need to go over benefits for colpo.

## 2014-05-05 NOTE — Telephone Encounter (Signed)
Message copied by Laqueta Due on Mon May 05, 2014  3:37 PM ------      Message from: Lowella Fairy      Created: Mon May 05, 2014 10:44 AM       Patient notified of abnormal pap and needs colposcopy.  Advised will have Sabrina precert procedure and she will call back with benefits and make appointment for colposcopy.  Note routed to [place]. ------

## 2014-05-06 ENCOUNTER — Ambulatory Visit (HOSPITAL_COMMUNITY)
Admission: RE | Admit: 2014-05-06 | Discharge: 2014-05-06 | Disposition: A | Discharge status: Home / Self Care | Payer: 59 | Source: Ambulatory Visit | Attending: Obstetrics and Gynecology | Admitting: Obstetrics and Gynecology

## 2014-05-06 DIAGNOSIS — Z1231 Encounter for screening mammogram for malignant neoplasm of breast: Secondary | ICD-10-CM | POA: Insufficient documentation

## 2014-05-06 NOTE — Telephone Encounter (Signed)
Patient returning Sabrina's call. °

## 2014-05-06 NOTE — Telephone Encounter (Signed)
Spoke with patient. Advised of $40 copay for colpo. Scheduled colpo. Mailed the In-Office procedure form that includes appointment date and time, patient copay, and cancellation policy.

## 2014-06-06 ENCOUNTER — Ambulatory Visit (INDEPENDENT_AMBULATORY_CARE_PROVIDER_SITE_OTHER): Payer: 59 | Admitting: Obstetrics and Gynecology

## 2014-06-06 ENCOUNTER — Encounter: Payer: Self-pay | Admitting: Obstetrics and Gynecology

## 2014-06-06 VITALS — BP 120/82 | HR 80 | Ht 65.0 in | Wt 235.2 lb

## 2014-06-06 DIAGNOSIS — R6889 Other general symptoms and signs: Secondary | ICD-10-CM

## 2014-06-06 DIAGNOSIS — IMO0002 Reserved for concepts with insufficient information to code with codable children: Secondary | ICD-10-CM

## 2014-06-06 DIAGNOSIS — R87610 Atypical squamous cells of undetermined significance on cytologic smear of cervix (ASC-US): Secondary | ICD-10-CM | POA: Insufficient documentation

## 2014-06-06 DIAGNOSIS — R87810 Cervical high risk human papillomavirus (HPV) DNA test positive: Secondary | ICD-10-CM

## 2014-06-06 NOTE — Patient Instructions (Signed)
Colposcopy Care After Colposcopy is a procedure in which a special tool is used to magnify the surface of the cervix. A tissue sample (biopsy) may also be taken. This sample will be looked at for cervical cancer or other problems. After the test:  You may have some cramping.  Lie down for a few minutes if you feel lightheaded.   You may have some bleeding which should stop in a few days. HOME CARE  Do not have sex or use tampons for 2 to 3 days or as told.  Only take medicine as told by your doctor.  Continue to take your birth control pills as usual. Finding out the results of your test Ask when your test results will be ready. Make sure you get your test results. GET HELP RIGHT AWAY IF:  You are bleeding a lot or are passing blood clots.  You develop a fever of 102 F (38.9 C) or higher.  You have abnormal vaginal discharge.  You have cramps that do not go away with medicine.  You feel lightheaded, dizzy, or pass out (faint). MAKE SURE YOU:   Understand these instructions.  Will watch your condition.  Will get help right away if you are not doing well or get worse. Document Released: 05/30/2008 Document Revised: 03/05/2012 Document Reviewed: 07/11/2013 ExitCare Patient Information 2014 [employer].  

## 2014-06-06 NOTE — Progress Notes (Signed)
Subjective:     Patient ID: Carmen Edwards, female   DOB: 04-11-1966, 48 y.o.   MRN: [id number]  HPI LMP 05/14/14 Contraception Jolivette.(progesterone only OCP).  Patient here for colposcopy. Pap on 04/25/14 showed ASCUS, endometrial cells, and positive HR HPV.  Bloody smear noted.  Patient's LMP was 04/24/14.  Prior pap and high risk HPV testing: 04-01-13 ascus:neg HR HPV.  History of abnormal pap smear: Hx of CIN II 1988:patient unsure if had cryotherapy to cervix or conization? LGSIL in 2003 on colposcopic biopsy.    Review of Systems     Objective:   Physical Exam  Genitourinary:           Colposcopy of the cervix, vagina, and vulva. Consent for procedure.  Speculum placed. Acetic acid to vagina and cervix.  Satisfactory colposcopy.  Small area of subtle punctations versus squamous metaplasia seen at 6 o'clock. ECC and biopsy of the cervix at 6 o'clock sent to pathology separately.  Monsel's applied to cervix.  No complications and minimal EBL.   Assessment:     ASCUS and positive HR HPV.    Plan:     Instructions/precautions given.  Contact patient with biopsy results. Anticipate pap and HPV testing in one year.     After visit summary to patient.

## 2014-06-10 LAB — IPS OTHER TISSUE BIOPSY

## 2014-10-20 ENCOUNTER — Telehealth: Payer: Self-pay | Admitting: Obstetrics and Gynecology

## 2014-10-20 NOTE — Telephone Encounter (Signed)
Dr cx/rs pt appt lmtcb to rs

## 2014-10-27 ENCOUNTER — Encounter: Payer: Self-pay | Admitting: Obstetrics and Gynecology

## 2014-11-13 ENCOUNTER — Encounter (HOSPITAL_COMMUNITY): Payer: Self-pay | Admitting: *Deleted

## 2014-11-28 ENCOUNTER — Encounter (HOSPITAL_COMMUNITY): Payer: Self-pay

## 2014-11-28 ENCOUNTER — Encounter (HOSPITAL_COMMUNITY)
Admission: RE | Disposition: A | Discharge status: Home / Self Care | Payer: 59 | Source: Ambulatory Visit | Attending: Gastroenterology

## 2014-11-28 ENCOUNTER — Ambulatory Visit (HOSPITAL_COMMUNITY): Payer: 59 | Admitting: Anesthesiology

## 2014-11-28 ENCOUNTER — Ambulatory Visit (HOSPITAL_COMMUNITY)
Admission: RE | Admit: 2014-11-28 | Discharge: 2014-11-28 | Disposition: A | Discharge status: Home / Self Care | Payer: 59 | Source: Ambulatory Visit | Attending: Gastroenterology | Admitting: Gastroenterology

## 2014-11-28 DIAGNOSIS — G43909 Migraine, unspecified, not intractable, without status migrainosus: Secondary | ICD-10-CM | POA: Insufficient documentation

## 2014-11-28 DIAGNOSIS — Z85038 Personal history of other malignant neoplasm of large intestine: Secondary | ICD-10-CM | POA: Diagnosis not present

## 2014-11-28 DIAGNOSIS — I10 Essential (primary) hypertension: Secondary | ICD-10-CM | POA: Insufficient documentation

## 2014-11-28 DIAGNOSIS — K635 Polyp of colon: Secondary | ICD-10-CM | POA: Diagnosis not present

## 2014-11-28 DIAGNOSIS — Z800 Family history of malignant neoplasm of digestive organs: Secondary | ICD-10-CM | POA: Diagnosis not present

## 2014-11-28 DIAGNOSIS — K219 Gastro-esophageal reflux disease without esophagitis: Secondary | ICD-10-CM | POA: Diagnosis not present

## 2014-11-28 DIAGNOSIS — Z1211 Encounter for screening for malignant neoplasm of colon: Secondary | ICD-10-CM | POA: Insufficient documentation

## 2014-11-28 DIAGNOSIS — F17210 Nicotine dependence, cigarettes, uncomplicated: Secondary | ICD-10-CM | POA: Insufficient documentation

## 2014-11-28 DIAGNOSIS — E780 Pure hypercholesterolemia: Secondary | ICD-10-CM | POA: Diagnosis not present

## 2014-11-28 HISTORY — DX: Family history of other specified conditions: Z84.89

## 2014-11-28 HISTORY — DX: Gastro-esophageal reflux disease without esophagitis: K21.9

## 2014-11-28 HISTORY — PX: COLONOSCOPY WITH PROPOFOL: SHX5780

## 2014-11-28 SURGERY — COLONOSCOPY WITH PROPOFOL
Anesthesia: Monitor Anesthesia Care

## 2014-11-28 MED ORDER — LIDOCAINE HCL (CARDIAC) 20 MG/ML IV SOLN
INTRAVENOUS | Status: AC
  Filled 2014-11-28: qty 5

## 2014-11-28 MED ORDER — LACTATED RINGERS IV SOLN: INTRAVENOUS | Status: DC

## 2014-11-28 MED ORDER — PROPOFOL 10 MG/ML IV BOLUS
INTRAVENOUS | Status: AC
  Filled 2014-11-28: qty 20

## 2014-11-28 SURGICAL SUPPLY — 22 items

## 2014-11-28 NOTE — Anesthesia Preprocedure Evaluation (Addendum)
Anesthesia Evaluation  Patient identified by MRN, date of birth, ID band Patient awake    Reviewed: Allergy & Precautions, H&P , NPO status , Patient's Chart, lab work & pertinent test results, reviewed documented beta blocker date and time   Airway Mallampati: II  TM Distance: >3 FB Neck ROM: full    Dental no notable dental hx.    Pulmonary neg pulmonary ROS, Current Smoker,  breath sounds clear to auscultation  Pulmonary exam normal       Cardiovascular Exercise Tolerance: Good hypertension, On Home Beta Blockers and Pt. on home beta blockers Rhythm:regular Rate:Normal     Neuro/Psych negative neurological ROS  negative psych ROS   GI/Hepatic negative GI ROS, Neg liver ROS, GERD-  Medicated and Controlled,  Endo/Other  negative endocrine ROSMorbid obesity  Renal/GU negative Renal ROS  negative genitourinary   Musculoskeletal   Abdominal (+) + obese,   Peds  Hematology negative hematology ROS (+)   Anesthesia Other Findings   Reproductive/Obstetrics negative OB ROS                            Anesthesia Physical Anesthesia Plan  ASA: III  Anesthesia Plan: MAC   Post-op Pain Management:    Induction:   Airway Management Planned:   Additional Equipment:   Intra-op Plan:   Post-operative Plan:   Informed Consent: I have reviewed the patients History and Physical, chart, labs and discussed the procedure including the risks, benefits and alternatives for the proposed anesthesia with the patient or authorized representative who has indicated his/her understanding and acceptance.   Dental Advisory Given  Plan Discussed with: CRNA and Surgeon  Anesthesia Plan Comments:        Anesthesia Quick Evaluation

## 2014-11-28 NOTE — Op Note (Signed)
[place]  COLONOSCOPY PROCEDURE NOTE ([place] not working at present)  Date:  November 28, 2014  Procedure: Colonoscopy with biopsy  Physician:  Cleotis Nipper, M.D.  Indication:  Strong family history of colon cancer (maternal grandmother, aunt and uncle; father); history of adenomatous polyp November 2010  Procedure:  Outpatient, PEG prep  Medication:  MAC  Scope:  Pentax adult colonoscope  Quality of prep:  Excellent  Extent reached:   Terminal ileum  Difficulty of exam:  Easy  Maneuvers required:  None  Findings:     Solitary diminutive polyp in ascending colon, completely excised by cold biopsy. Otherwise normal exam:  No masses, colitis, AVM's or diverticulosis. Retroflexion normal.  Patient tolerated well, no apparent complications.  IMPRESSION:    1. Solitary diminutive polyp 2. Strong family history of colon cancer.  PLAN:  1.  Await pathology results 2.  Genetic counselling has been recommended to the patient (for either her or her mother); she will give this some thought. 3.  Repeat colonoscopy in 5 years, or sooner if indicated by genetic counselling.  Cleotis Nipper, M.D. [phone]  Cc:  London Pepper, MD

## 2014-11-28 NOTE — H&P (Signed)
Carmen Edwards is an 48 y.o. female.   Chief Complaint: history of colon polyp, fam hx colon cancer  HPI: No complaints.  Colonoscopy 5 yrs ago showed a solitary diminutive adenomatous polyp  Past Medical History  Diagnosis Date  . Hypertension   . High cholesterol   . Hx of chlamydia infection 1989  . GERD (gastroesophageal reflux disease)   . Migraine     occasional menustral cycle related  . Family history of adverse reaction to anesthesia     mother nausea    Past Surgical History  Procedure Laterality Date  . Toe surgery      toe nail removed  . Hysteroscopy  01/2012    Dr. Joan Flores  . Fibroid resection  01/2012    Dr. Joan Flores  . Skin lesion excision      --actually frozen by dermatology--benign    Family History  Problem Relation Age of Onset  . Cancer - Colon Father   . Hypertension Father   . Hyperlipidemia Father   . Heart disease Father   . Hyperlipidemia Mother   . Cancer - Colon Maternal Grandmother   . Heart disease Paternal Grandfather   . Hypertension Brother   . Heart disease Brother 78    heart attack  . Cancer - Colon Maternal Aunt   . Cancer - Colon Maternal Uncle    Social History:  reports that she has been smoking Cigarettes.  She has been smoking about 0.00 packs per day. She has never used smokeless tobacco. She reports that she drinks alcohol. She reports that she does not use illicit drugs.  Allergies:  Allergies  Allergen Reactions  . Amoxicillin Nausea And Vomiting    (high doses)    Medications Prior to Admission  Medication Sig Dispense Refill  . atenolol (TENORMIN) 50 MG tablet Take 50 mg by mouth daily with lunch.     Marland Kitchen atorvastatin (LIPITOR) 10 MG tablet Take 5 mg by mouth at bedtime.     Marland Kitchen ibuprofen (ADVIL,MOTRIN) 200 MG tablet Take 400 mg by mouth every 6 (six) hours as needed for headache or moderate pain.    Marland Kitchen norethindrone (HEATHER) 0.35 MG tablet Take 1 tablet (0.35 mg total) by mouth daily. (Patient taking differently:  Take 1 tablet by mouth every evening. ) 3 Package 3  . omeprazole (PRILOSEC) 20 MG capsule Take 20 mg by mouth daily with lunch.       No results found for this or any previous visit (from the past 48 hour(s)). No results found.  ROS No lower GI sx  Blood pressure 148/83, pulse 62, temperature 98.1 F (36.7 C), temperature source Oral, resp. rate 13, height 5\' 6"  (1.676 m), weight 102.059 kg (225 lb), last menstrual period 11/24/2014, SpO2 99 %. Physical Exam NAD Chest clear    Heart nl  Abd slt adipose, no guarding, Mass, tenderness.  Neuro: nl MS and no overt focality  Assessment/Plan Stable, appropriate for screening colonoscopy.  Will use propofol sedn as before.  Izabella Marcantel V 11/28/2014, 9:16 AM

## 2014-11-28 NOTE — Addendum Note (Signed)
Addendum  created 11/28/14 1232 by Lollie Sails, CRNA   Modules edited: Anesthesia Events, Anesthesia Flowsheet, Anesthesia Responsible Staff

## 2014-11-28 NOTE — Discharge Instructions (Signed)

## 2014-11-29 ENCOUNTER — Encounter (HOSPITAL_COMMUNITY): Payer: Self-pay | Admitting: Gastroenterology

## 2014-12-08 MED FILL — Propofol IV Emul 10 MG/ML: INTRAVENOUS | Qty: 40 | Status: AC

## 2014-12-10 MED FILL — Lidocaine HCl IV Inj 20 MG/ML: INTRAVENOUS | Qty: 5 | Status: AC

## 2015-04-30 ENCOUNTER — Ambulatory Visit: Payer: 59 | Admitting: Obstetrics and Gynecology

## 2015-05-01 ENCOUNTER — Encounter: Payer: Self-pay | Admitting: Obstetrics and Gynecology

## 2015-05-01 ENCOUNTER — Ambulatory Visit (INDEPENDENT_AMBULATORY_CARE_PROVIDER_SITE_OTHER): Payer: 59 | Admitting: Obstetrics and Gynecology

## 2015-05-01 VITALS — BP 114/70 | HR 70 | Resp 16 | Ht 65.0 in | Wt 228.6 lb

## 2015-05-01 DIAGNOSIS — Z0000 Encounter for general adult medical examination without abnormal findings: Secondary | ICD-10-CM

## 2015-05-01 DIAGNOSIS — Z01419 Encounter for gynecological examination (general) (routine) without abnormal findings: Secondary | ICD-10-CM | POA: Diagnosis not present

## 2015-05-01 LAB — POCT URINALYSIS DIPSTICK
Bilirubin, UA: NEGATIVE
Blood, UA: NEGATIVE
Glucose, UA: NEGATIVE
KETONES UA: NEGATIVE
Leukocytes, UA: NEGATIVE
Nitrite, UA: NEGATIVE
Protein, UA: NEGATIVE
UROBILINOGEN UA: NEGATIVE
pH, UA: 5

## 2015-05-01 MED ORDER — NORETHINDRONE 0.35 MG PO TABS: 1.0000 | ORAL_TABLET | Freq: Every day | ORAL | Status: DC

## 2015-05-01 NOTE — Patient Instructions (Signed)

## 2015-05-01 NOTE — Progress Notes (Signed)
Patient ID: Carmen Edwards, female   DOB: 1966-05-08, 49 y.o.   MRN: [id number] 49 y.o. G68P1011 Married Caucasian female here for annual exam.    No much of a cycle on her OCPs.  Cycle is light and lasts a couple of days.  No pain. No missed or late pills.   Urinary incontinence.  Physical therapy helped in the past, but patient stopped doing exercises.  No bowel function problems.   New job for the last year.   Has heel pain.  Will need to see her PCP.   PCP:   London Pepper, MD  Patient's last menstrual period was 04/17/2015 (approximate).          Sexually active: Yes.   female partner The current method of family planning is oral progesterone-only contraceptive. - Micronor   Exercising: No.  none. Smoker:  Yes, smokes approximately 10 cigarettes/year.  Health Maintenance: Pap:  04-25-14 Ascus:Pos HR HPV History of abnormal Pap:  Yes, 06-06-14 colposcopy--LSIL; Hx of possible cryotherapy or conization of cervix 1998. MMG:  05-07-14 fibroglandular density/nl:[facility] Colonoscopy:  11/2014  adenomatous polyp with Dr. Cristina Gong.  Next due 12 2020 BMD:   n/a  Result  n/a TDaP:  Unsure.  She will check with [facility] as her prior vaccine administrator.  Screening Labs:  Hb today: PCP, Urine today: Neg   reports that she has been smoking Cigarettes.  She has been smoking about 0.00 packs per day. She has never used smokeless tobacco. She reports that she does not drink alcohol or use illicit drugs.  Past Medical History  Diagnosis Date  . Hypertension   . High cholesterol   . Hx of chlamydia infection 1989  . GERD (gastroesophageal reflux disease)   . Migraine     occasional menustral cycle related  . Family history of adverse reaction to anesthesia     mother nausea    Past Surgical History  Procedure Laterality Date  . Toe surgery      toe nail removed  . Hysteroscopy  01/2012    Dr. Joan Flores  . Fibroid resection  01/2012    Dr. Joan Flores  . Skin lesion excision     --actually frozen by dermatology--benign  . Colonoscopy with propofol N/A 11/28/2014    Procedure: COLONOSCOPY WITH PROPOFOL;  Surgeon: Cleotis Nipper, MD;  Location: [facility] ENDOSCOPY;  Service: Endoscopy;  Laterality: N/A;    Current Outpatient Prescriptions  Medication Sig Dispense Refill  . atenolol (TENORMIN) 50 MG tablet Take 50 mg by mouth daily with lunch.     Marland Kitchen atorvastatin (LIPITOR) 10 MG tablet Take 5 mg by mouth at bedtime.     Marland Kitchen ibuprofen (ADVIL,MOTRIN) 200 MG tablet Take 400 mg by mouth every 6 (six) hours as needed for headache or moderate pain.    Marland Kitchen norethindrone (HEATHER) 0.35 MG tablet Take 1 tablet (0.35 mg total) by mouth daily. (Patient taking differently: Take 1 tablet by mouth every evening. ) 3 Package 3  . omeprazole (PRILOSEC) 20 MG capsule Take 20 mg by mouth daily with lunch.      No current facility-administered medications for this visit.    Family History  Problem Relation Age of Onset  . Cancer - Colon Father   . Hypertension Father   . Hyperlipidemia Father   . Heart disease Father   . Hyperlipidemia Mother   . Cancer - Colon Maternal Grandmother   . Heart disease Paternal Grandfather   . Hypertension Brother   .  Heart disease Brother 49    heart attack  . Cancer - Colon Maternal Aunt   . Cancer - Colon Maternal Uncle     ROS:  Pertinent items are noted in HPI.  Otherwise, a comprehensive ROS was negative.  Exam:   BP 114/70 mmHg  Pulse 70  Resp 16  Ht 5\' 5"  (1.651 m)  Wt 228 lb 9.6 oz (103.692 kg)  BMI 38.04 kg/m2  LMP 04/17/2015 (Approximate)    General appearance: alert, cooperative and appears stated age Head: Normocephalic, without obvious abnormality, atraumatic Neck: no adenopathy, supple, symmetrical, trachea midline and thyroid normal to inspection and palpation Lungs: clear to auscultation bilaterally Breasts: normal appearance, no masses or tenderness, Inspection negative, No nipple retraction or dimpling, No nipple discharge or  bleeding, No axillary or supraclavicular adenopathy Heart: regular rate and rhythm Abdomen: soft, non-tender; bowel sounds normal; no masses,  no organomegaly Extremities: extremities normal, atraumatic, no cyanosis or edema Skin: Skin color, texture, turgor normal. No rashes or lesions Lymph nodes: Cervical, supraclavicular, and axillary nodes normal. No abnormal inguinal nodes palpated Neurologic: Grossly normal  Pelvic: External genitalia:  no lesions              Urethra:  normal appearing urethra with no masses, tenderness or lesions              Bartholins and Skenes: normal                 Vagina: normal appearing vagina with normal color and discharge, no lesions              Cervix: no lesions              Pap taken: Yes.   Bimanual Exam:  Uterus:  normal size, contour, position, consistency, mobility, non-tender              Adnexa: normal adnexa and no mass, fullness, tenderness              Rectovaginal: Yes.  .  Confirms.              Anus:  normal sphincter tone, no lesions  Chaperone was present for exam.  Assessment:   Well woman visit with normal exam. LGSIL of cervix.  Smoker over age 29.  FH colon cancer.   Plan: Yearly mammogram recommended after age 15.  Recommended self breast exam.  Pap and HR HPV as above. Discussed Calcium, Vitamin D, regular exercise program including cardiovascular and weight bearing exercise. Labs performed.  No. Refills given on medications.  Yes.  .  See orders.  Micronor for one year.  Discussed smoking cessation.  Follow up annually and prn.     After visit summary provided.

## 2015-05-06 LAB — IPS PAP TEST WITH HPV

## 2015-05-12 ENCOUNTER — Telehealth: Payer: Self-pay | Admitting: Emergency Medicine

## 2015-05-12 DIAGNOSIS — IMO0002 Reserved for concepts with insufficient information to code with codable children: Secondary | ICD-10-CM

## 2015-05-12 NOTE — Telephone Encounter (Signed)
Patient notified of message from Dr. Quincy Simmonds.  She is agreeable to scheduling colposcopy.    Colposcopy pre-procedure instructions given. Discussed menses and need to not have any bleeding on day of appointment, advised to call to reschedule if starts cycle.  Make sure to eat a meal and hydrate before appointment.  Advised 800 mg of Motrin with food one hour prior to appointment. Motrin/Advil or Ibuprofen. Take 800 mg (Can purchase over the counter, you will need four 200 mg pills).  Patient verbalized understanding of preprocedure instructions and cancellation policy and will call to reschedule if will be on menses or has any concerns regarding pregnancy.  Patient is advised she will be contacted with insurance coverage information.           [place] for insurance pre certification.    LMP 05/08/15, currently on progesterone only pills.

## 2015-05-12 NOTE — Telephone Encounter (Signed)
-----   Message from Nunzio Cobbs, MD sent at 05/07/2015 10:14 PM EDT ----- Please inform patient of pap showing ASCUS and positive HR HPV.  Time for colposcopy again.  Please let patient know and send to precert.   Thank you.  Cc - Marisa Sprinkles

## 2015-05-12 NOTE — Telephone Encounter (Signed)
Pre-cert complete

## 2015-05-22 ENCOUNTER — Encounter: Payer: Self-pay | Admitting: Obstetrics and Gynecology

## 2015-05-22 ENCOUNTER — Ambulatory Visit (INDEPENDENT_AMBULATORY_CARE_PROVIDER_SITE_OTHER): Payer: 59 | Admitting: Obstetrics and Gynecology

## 2015-05-22 DIAGNOSIS — R896 Abnormal cytological findings in specimens from other organs, systems and tissues: Secondary | ICD-10-CM | POA: Diagnosis not present

## 2015-05-22 DIAGNOSIS — IMO0002 Reserved for concepts with insufficient information to code with codable children: Secondary | ICD-10-CM

## 2015-05-22 NOTE — Progress Notes (Signed)
Subjective:     Patient ID: Carmen Edwards, female   DOB: 25-Apr-1966, 49 y.o.   MRN: [id number]  HPI  Patient is here today for colposcopy for pap showing ASCUS and positive HR HPV on 05/01/15.   Prior pap May 2015 - ASCUS and postitive HR HPV.  Colposcopy June 2015 - LGSIL. Hx of possible cryotherapy or conization in 1998.   LMP 05/15/15.  Contraception - Micronor.  Review of Systems    See HPI. Objective:   Physical Exam  Genitourinary:        Colposcopy Consent for procedure.  Speculum placed in vagina.  3% acetic acid placed.  Colposcopy satisfactory.  Biopsy of acetowhite change at 8:00.  Very small area involved. Tissue to pathology.  ECC performed.  Tissue to pathology.  Monsel's placed.  Minimal EBL.  No complications.    Assessment:     ASCUS and positive HR HPV. Hx of LGSIL on colpo biopsy.     Plan:     Colposcopy instructions/precautions given.  Discussion of HPV, cervical dysplasia, and treatment for cervical dysplasia with LEEP procedure when moderate - high grade dysplasia or persistent LGSIL is present.  Questions answered.  Follow up biopsies.  Will have at least cotesting in 12 months.   After visit summary to patient.

## 2015-05-22 NOTE — Patient Instructions (Signed)

## 2015-05-27 LAB — IPS OTHER TISSUE BIOPSY

## 2015-06-16 ENCOUNTER — Other Ambulatory Visit: Payer: Self-pay | Admitting: Obstetrics and Gynecology

## 2015-06-16 DIAGNOSIS — Z1231 Encounter for screening mammogram for malignant neoplasm of breast: Secondary | ICD-10-CM

## 2015-06-18 ENCOUNTER — Other Ambulatory Visit: Payer: Self-pay | Admitting: Obstetrics and Gynecology

## 2015-06-18 ENCOUNTER — Ambulatory Visit (HOSPITAL_COMMUNITY)
Admission: RE | Admit: 2015-06-18 | Discharge: 2015-06-18 | Disposition: A | Discharge status: Home / Self Care | Payer: 59 | Source: Ambulatory Visit | Attending: Obstetrics and Gynecology | Admitting: Obstetrics and Gynecology

## 2015-06-18 DIAGNOSIS — Z1231 Encounter for screening mammogram for malignant neoplasm of breast: Secondary | ICD-10-CM | POA: Diagnosis present

## 2015-06-23 ENCOUNTER — Other Ambulatory Visit: Payer: Self-pay | Admitting: Obstetrics and Gynecology

## 2015-06-23 DIAGNOSIS — R928 Other abnormal and inconclusive findings on diagnostic imaging of breast: Secondary | ICD-10-CM

## 2015-06-25 ENCOUNTER — Ambulatory Visit
Admission: RE | Admit: 2015-06-25 | Discharge: 2015-06-25 | Disposition: A | Discharge status: Home / Self Care | Payer: 59 | Source: Ambulatory Visit | Attending: Obstetrics and Gynecology | Admitting: Obstetrics and Gynecology

## 2015-06-25 DIAGNOSIS — R928 Other abnormal and inconclusive findings on diagnostic imaging of breast: Secondary | ICD-10-CM

## 2015-07-09 ENCOUNTER — Other Ambulatory Visit (HOSPITAL_COMMUNITY): Payer: Self-pay | Admitting: Family Medicine

## 2015-07-09 DIAGNOSIS — E049 Nontoxic goiter, unspecified: Secondary | ICD-10-CM

## 2015-07-10 ENCOUNTER — Other Ambulatory Visit (HOSPITAL_COMMUNITY): Payer: Self-pay | Admitting: Family Medicine

## 2015-07-10 DIAGNOSIS — E049 Nontoxic goiter, unspecified: Secondary | ICD-10-CM

## 2015-07-17 ENCOUNTER — Ambulatory Visit (HOSPITAL_COMMUNITY)
Admission: RE | Admit: 2015-07-17 | Discharge: 2015-07-17 | Disposition: A | Discharge status: Home / Self Care | Payer: 59 | Source: Ambulatory Visit | Attending: Family Medicine | Admitting: Family Medicine

## 2015-07-17 DIAGNOSIS — E049 Nontoxic goiter, unspecified: Secondary | ICD-10-CM | POA: Insufficient documentation

## 2016-01-19 ENCOUNTER — Telehealth: Payer: Self-pay | Admitting: Obstetrics and Gynecology

## 2016-01-19 NOTE — Telephone Encounter (Signed)
Medication refill request: Micronor Last AEX:  05/01/2015 Dr. Quincy Simmonds Next AEX: 05/06/2016 Dr. Quincy Simmonds Last MMG (if hormonal medication request): 06/25/2015 [facility] Category 3 Probably benign Refill authorized: 05/01/2015 #3 package 3 Refills  Refill to [facility] in [place] instead of [facility].  Today: #3 packs 1 Refill ? Please advise

## 2016-01-19 NOTE — Telephone Encounter (Signed)
Please let patient know that she is overdue for her mammogram follow up.  She is in mammogram recall for December 2016.

## 2016-01-19 NOTE — Telephone Encounter (Signed)
Patient calling about refill. I discussed with her that she is overdue for her Mammogram.  She said she had the paper to get it scheduled but hasn't scheduled anything yet.  She notes that she has 2 weeks left of Micronor.  She understands the instruction and will call back when she gets scheduled.

## 2016-01-20 ENCOUNTER — Other Ambulatory Visit: Payer: Self-pay | Admitting: Obstetrics and Gynecology

## 2016-01-20 ENCOUNTER — Other Ambulatory Visit: Payer: Self-pay

## 2016-01-20 DIAGNOSIS — N6489 Other specified disorders of breast: Secondary | ICD-10-CM

## 2016-01-20 MED ORDER — NORETHINDRONE 0.35 MG PO TABS: 1.0000 | ORAL_TABLET | Freq: Every day | ORAL | Status: DC

## 2016-01-20 NOTE — Telephone Encounter (Signed)
Patient is returning a call to [place].

## 2016-01-20 NOTE — Telephone Encounter (Signed)
OK for one month refill of Micronor to pharmacy of choice. Please send in.

## 2016-01-20 NOTE — Telephone Encounter (Signed)
Returned patients calland left a message on VM confirming she has a MMG appt scheduled.  I also stated I would send a message to Dr. Quincy Simmonds informing her that the patient is scheduled.  She may call back if she has any questions.  MMG Scheduled for  01/29/2016 Next AEX: 05/06/2016  Can we Fill #1 pack 0 Refills for Micronor until after her MMG?  Please advise?

## 2016-01-27 HISTORY — PX: BREAST SURGERY: SHX581

## 2016-01-29 ENCOUNTER — Ambulatory Visit
Admission: RE | Admit: 2016-01-29 | Discharge: 2016-01-29 | Disposition: A | Discharge status: Home / Self Care | Payer: Managed Care, Other (non HMO) | Source: Ambulatory Visit | Attending: Obstetrics and Gynecology | Admitting: Obstetrics and Gynecology

## 2016-01-29 ENCOUNTER — Other Ambulatory Visit: Payer: Self-pay | Admitting: Obstetrics and Gynecology

## 2016-01-29 DIAGNOSIS — N6489 Other specified disorders of breast: Secondary | ICD-10-CM

## 2016-02-03 ENCOUNTER — Ambulatory Visit
Admission: RE | Admit: 2016-02-03 | Discharge: 2016-02-03 | Disposition: A | Discharge status: Home / Self Care | Payer: Managed Care, Other (non HMO) | Source: Ambulatory Visit | Attending: Obstetrics and Gynecology | Admitting: Obstetrics and Gynecology

## 2016-02-03 DIAGNOSIS — N6489 Other specified disorders of breast: Secondary | ICD-10-CM

## 2016-02-16 ENCOUNTER — Other Ambulatory Visit: Payer: Self-pay | Admitting: Obstetrics and Gynecology

## 2016-02-16 NOTE — Telephone Encounter (Signed)
Patient has had her mammogram done and states the results should of been sent to Dr. Quincy Simmonds. She wants to discuss the results from the biopsy as well.

## 2016-02-16 NOTE — Telephone Encounter (Signed)
Medication refill request: OCP Last AEX:  05-01-15  Next AEX: 05-06-16 Last MMG (if hormonal medication request): 02-03-16 RT breast BX -WNL  return for diagnostic MMG in 6 mos Refill authorized: please advise

## 2016-02-16 NOTE — Telephone Encounter (Signed)
Spoke with patient. She is aware of biopsy results of Carmen Edwards in breast. She is out of mammogram hold and recall for 6 months.  She denies complaints.  Advised that refill of Micronor was placed to last her until next annual exam with Carmen Edwards in May.  Patient advised to continue self breast exams monthly and call back with any concerns.  Routing to provider for final review. Patient agreeable to disposition. Will close encounter.

## 2016-05-06 ENCOUNTER — Ambulatory Visit (INDEPENDENT_AMBULATORY_CARE_PROVIDER_SITE_OTHER): Payer: Managed Care, Other (non HMO) | Admitting: Obstetrics and Gynecology

## 2016-05-06 ENCOUNTER — Encounter: Payer: Self-pay | Admitting: Obstetrics and Gynecology

## 2016-05-06 VITALS — BP 130/84 | HR 70 | Resp 14 | Ht 64.5 in | Wt 226.4 lb

## 2016-05-06 DIAGNOSIS — Z01419 Encounter for gynecological examination (general) (routine) without abnormal findings: Secondary | ICD-10-CM | POA: Diagnosis not present

## 2016-05-06 MED ORDER — NORETHINDRONE 0.35 MG PO TABS: 1.0000 | ORAL_TABLET | Freq: Every day | ORAL | Status: DC

## 2016-05-06 MED ORDER — NYSTATIN-TRIAMCINOLONE 100000-0.1 UNIT/GM-% EX CREA: 1.0000 "application " | TOPICAL_CREAM | Freq: Two times a day (BID) | CUTANEOUS | Status: DC

## 2016-05-06 MED ORDER — FLUCONAZOLE 150 MG PO TABS: 150.0000 mg | ORAL_TABLET | Freq: Once | ORAL | Status: DC

## 2016-05-06 NOTE — Patient Instructions (Signed)
Health Maintenance, Female Adopting a healthy lifestyle and getting preventive care can go a long way to promote health and wellness. Talk with your health care provider about what schedule of regular examinations is right for you. This is a good chance for you to check in with your provider about disease prevention and staying healthy. In between checkups, there are plenty of things you can do on your own. Experts have done a lot of research about which lifestyle changes and preventive measures are most likely to keep you healthy. Ask your health care provider for more information. WEIGHT AND DIET  Eat a healthy diet  Be sure to include plenty of vegetables, fruits, low-fat dairy products, and lean protein.  Do not eat a lot of foods high in solid fats, added sugars, or salt.  Get regular exercise. This is one of the most important things you can do for your health.  Most adults should exercise for at least 150 minutes each week. The exercise should increase your heart rate and make you sweat (moderate-intensity exercise).  Most adults should also do strengthening exercises at least twice a week. This is in addition to the moderate-intensity exercise.  Maintain a healthy weight  Body mass index (BMI) is a measurement that can be used to identify possible weight problems. It estimates body fat based on height and weight. Your health care provider can help determine your BMI and help you achieve or maintain a healthy weight.  For females 20 years of age and older:   A BMI below 18.5 is considered underweight.  A BMI of 18.5 to 24.9 is normal.  A BMI of 25 to 29.9 is considered overweight.  A BMI of 30 and above is considered obese.  Watch levels of cholesterol and blood lipids  You should start having your blood tested for lipids and cholesterol at 48 years of age, then have this test every 5 years.  You may need to have your cholesterol levels checked more often if:  Your lipid  or cholesterol levels are high.  You are older than 48 years of age.  You are at high risk for heart disease.  CANCER SCREENING   Lung Cancer  Lung cancer screening is recommended for adults 55-80 years old who are at high risk for lung cancer because of a history of smoking.  A yearly low-dose CT scan of the lungs is recommended for people who:  Currently smoke.  Have quit within the past 15 years.  Have at least a 30-pack-year history of smoking. A pack year is smoking an average of one pack of cigarettes a day for 1 year.  Yearly screening should continue until it has been 15 years since you quit.  Yearly screening should stop if you develop a health problem that would prevent you from having lung cancer treatment.  Breast Cancer  Practice breast self-awareness. This means understanding how your breasts normally appear and feel.  It also means doing regular breast self-exams. Let your health care provider know about any changes, no matter how small.  If you are in your 20s or 30s, you should have a clinical breast exam (CBE) by a health care provider every 1-3 years as part of a regular health exam.  If you are 40 or older, have a CBE every year. Also consider having a breast X-ray (mammogram) every year.  If you have a family history of breast cancer, talk to your health care provider about genetic screening.  If you   are at high risk for breast cancer, talk to your health care provider about having an MRI and a mammogram every year.  Breast cancer gene (BRCA) assessment is recommended for women who have family members with BRCA-related cancers. BRCA-related cancers include:  Breast.  Ovarian.  Tubal.  Peritoneal cancers.  Results of the assessment will determine the need for genetic counseling and BRCA1 and BRCA2 testing. Cervical Cancer Your health care provider may recommend that you be screened regularly for cancer of the pelvic organs (ovaries, uterus, and  vagina). This screening involves a pelvic examination, including checking for microscopic changes to the surface of your cervix (Pap test). You may be encouraged to have this screening done every 3 years, beginning at age 21.  For women ages 30-65, health care providers may recommend pelvic exams and Pap testing every 3 years, or they may recommend the Pap and pelvic exam, combined with testing for human papilloma virus (HPV), every 5 years. Some types of HPV increase your risk of cervical cancer. Testing for HPV may also be done on women of any age with unclear Pap test results.  Other health care providers may not recommend any screening for nonpregnant women who are considered low risk for pelvic cancer and who do not have symptoms. Ask your health care provider if a screening pelvic exam is right for you.  If you have had past treatment for cervical cancer or a condition that could lead to cancer, you need Pap tests and screening for cancer for at least 20 years after your treatment. If Pap tests have been discontinued, your risk factors (such as having a new sexual partner) need to be reassessed to determine if screening should resume. Some women have medical problems that increase the chance of getting cervical cancer. In these cases, your health care provider may recommend more frequent screening and Pap tests. Colorectal Cancer  This type of cancer can be detected and often prevented.  Routine colorectal cancer screening usually begins at 48 years of age and continues through 48 years of age.  Your health care provider may recommend screening at an earlier age if you have risk factors for colon cancer.  Your health care provider may also recommend using home test kits to check for hidden blood in the stool.  A small camera at the end of a tube can be used to examine your colon directly (sigmoidoscopy or colonoscopy). This is done to check for the earliest forms of colorectal  cancer.  Routine screening usually begins at age 50.  Direct examination of the colon should be repeated every 5-10 years through 48 years of age. However, you may need to be screened more often if early forms of precancerous polyps or small growths are found. Skin Cancer  Check your skin from head to toe regularly.  Tell your health care provider about any new moles or changes in moles, especially if there is a change in a mole's shape or color.  Also tell your health care provider if you have a mole that is larger than the size of a pencil eraser.  Always use sunscreen. Apply sunscreen liberally and repeatedly throughout the day.  Protect yourself by wearing long sleeves, pants, a wide-brimmed hat, and sunglasses whenever you are outside. HEART DISEASE, DIABETES, AND HIGH BLOOD PRESSURE   High blood pressure causes heart disease and increases the risk of stroke. High blood pressure is more likely to develop in:  People who have blood pressure in the high end   of the normal range (130-139/85-89 mm Hg).  People who are overweight or obese.  People who are African American.  If you are 38-23 years of age, have your blood pressure checked every 3-5 years. If you are 61 years of age or older, have your blood pressure checked every year. You should have your blood pressure measured twice--once when you are at a hospital or clinic, and once when you are not at a hospital or clinic. Record the average of the two measurements. To check your blood pressure when you are not at a hospital or clinic, you can use:  An automated blood pressure machine at a pharmacy.  A home blood pressure monitor.  If you are between 45 years and 39 years old, ask your health care provider if you should take aspirin to prevent strokes.  Have regular diabetes screenings. This involves taking a blood sample to check your fasting blood sugar level.  If you are at a normal weight and have a low risk for diabetes,  have this test once every three years after 48 years of age.  If you are overweight and have a high risk for diabetes, consider being tested at a younger age or more often. PREVENTING INFECTION  Hepatitis B  If you have a higher risk for hepatitis B, you should be screened for this virus. You are considered at high risk for hepatitis B if:  You were born in a country where hepatitis B is common. Ask your health care provider which countries are considered high risk.  Your parents were born in a high-risk country, and you have not been immunized against hepatitis B (hepatitis B vaccine).  You have HIV or AIDS.  You use needles to inject street drugs.  You live with someone who has hepatitis B.  You have had sex with someone who has hepatitis B.  You get hemodialysis treatment.  You take certain medicines for conditions, including cancer, organ transplantation, and autoimmune conditions. Hepatitis C  Blood testing is recommended for:  Everyone born from [birth year] through 1963.  Anyone with known risk factors for hepatitis C. Sexually transmitted infections (STIs)  You should be screened for sexually transmitted infections (STIs) including gonorrhea and chlamydia if:  You are sexually active and are younger than 48 years of age.  You are older than 48 years of age and your health care provider tells you that you are at risk for this type of infection.  Your sexual activity has changed since you were last screened and you are at an increased risk for chlamydia or gonorrhea. Ask your health care provider if you are at risk.  If you do not have HIV, but are at risk, it may be recommended that you take a prescription medicine daily to prevent HIV infection. This is called pre-exposure prophylaxis (PrEP). You are considered at risk if:  You are sexually active and do not regularly use condoms or know the HIV status of your partner(s).  You take drugs by injection.  You are sexually  active with a partner who has HIV. Talk with your health care provider about whether you are at high risk of being infected with HIV. If you choose to begin PrEP, you should first be tested for HIV. You should then be tested every 3 months for as long as you are taking PrEP.  PREGNANCY   If you are premenopausal and you may become pregnant, ask your health care provider about preconception counseling.  If you may  become pregnant, take 400 to 800 micrograms (mcg) of folic acid every day.  If you want to prevent pregnancy, talk to your health care provider about birth control (contraception). OSTEOPOROSIS AND MENOPAUSE   Osteoporosis is a disease in which the bones lose minerals and strength with aging. This can result in serious bone fractures. Your risk for osteoporosis can be identified using a bone density scan.  If you are 2 years of age or older, or if you are at risk for osteoporosis and fractures, ask your health care provider if you should be screened.  Ask your health care provider whether you should take a calcium or vitamin D supplement to lower your risk for osteoporosis.  Menopause may have certain physical symptoms and risks.  Hormone replacement therapy may reduce some of these symptoms and risks. Talk to your health care provider about whether hormone replacement therapy is right for you.  HOME CARE INSTRUCTIONS   Schedule regular health, dental, and eye exams.  Stay current with your immunizations.   Do not use any tobacco products including cigarettes, chewing tobacco, or electronic cigarettes.  If you are pregnant, do not drink alcohol.  If you are breastfeeding, limit how much and how often you drink alcohol.  Limit alcohol intake to no more than 1 drink per day for nonpregnant women. One drink equals 12 ounces of beer, 5 ounces of wine, or 1 ounces of hard liquor.  Do not use street drugs.  Do not share needles.  Ask your health care provider for help if  you need support or information about quitting drugs.  Tell your health care provider if you often feel depressed.  Tell your health care provider if you have ever been abused or do not feel safe at home.   This information is not intended to replace advice given to you by your health care provider. Make sure you discuss any questions you have with your health care provider.   Document Released: 06/27/2011 Document Revised: 01/02/2015 Document Reviewed: 11/13/2013 Elsevier Interactive Patient Education 2016 [employer].  Exercising to [employer] Exercising can help you to lose weight. In order to lose weight through exercise, you need to do vigorous-intensity exercise. You can tell that you are exercising with vigorous intensity if you are breathing very hard and fast and cannot hold a conversation while exercising. Moderate-intensity exercise helps to maintain your current weight. You can tell that you are exercising at a moderate level if you have a higher heart rate and faster breathing, but you are still able to hold a conversation. HOW OFTEN SHOULD I EXERCISE? Choose an activity that you enjoy and set realistic goals. Your health care provider can help you to make an activity plan that works for you. Exercise regularly as directed by your health care provider. This may include:  Doing resistance training twice each week, such as:  Push-ups.  Sit-ups.  Lifting weights.  Using resistance bands.  Doing a given intensity of exercise for a given amount of time. Choose from these options:  150 minutes of moderate-intensity exercise every week.  75 minutes of vigorous-intensity exercise every week.  A mix of moderate-intensity and vigorous-intensity exercise every week. Children, pregnant women, people who are out of shape, people who are overweight, and older adults may need to consult a health care provider for individual recommendations. If you have any sort of medical  condition, be sure to consult your health care provider before starting a new exercise program. WHAT ARE SOME  ACTIVITIES THAT CAN HELP ME TO LOSE WEIGHT?   Walking at a rate of at least 4.5 miles an hour.  Jogging or running at a rate of 5 miles per hour.  Biking at a rate of at least 10 miles per hour.  Lap swimming.  Roller-skating or in-line skating.  Cross-country skiing.  Vigorous competitive sports, such as football, basketball, and soccer.  Jumping rope.  Aerobic dancing. HOW CAN I BE MORE ACTIVE IN MY DAY-TO-DAY ACTIVITIES?  Use the stairs instead of the elevator.  Take a walk during your lunch break.  If you drive, park your car farther away from work or school.  If you take public transportation, get off one stop early and walk the rest of the way.  Make all of your phone calls while standing up and walking around.  Get up, stretch, and walk around every 30 minutes throughout the day. WHAT GUIDELINES SHOULD I FOLLOW WHILE EXERCISING?  Do not exercise so much that you hurt yourself, feel dizzy, or get very short of breath.  Consult your health care provider prior to starting a new exercise program.  Wear comfortable clothes and shoes with good support.  Drink plenty of water while you exercise to prevent dehydration or heat stroke. Body water is lost during exercise and must be replaced.  Work out until you breathe faster and your heart beats faster.   This information is not intended to replace advice given to you by your health care provider. Make sure you discuss any questions you have with your health care provider.   Document Released: 01/14/2011 Document Revised: 01/02/2015 Document Reviewed: 05/15/2014 Elsevier Interactive Patient Education 2016 [employer].  Calorie Counting for [employer] Calories are energy you get from the things you eat and drink. Your body uses this energy to keep you going throughout the day. The number of calories you eat  affects your weight. When you eat more calories than your body needs, your body stores the extra calories as fat. When you eat fewer calories than your body needs, your body burns fat to get the energy it needs. Calorie counting means keeping track of how many calories you eat and drink each day. If you make sure to eat fewer calories than your body needs, you should lose weight. In order for calorie counting to work, you will need to eat the number of calories that are right for you in a day to lose a healthy amount of weight per week. A healthy amount of weight to lose per week is usually 1-2 lb (0.5-0.9 kg). A dietitian can determine how many calories you need in a day and give you suggestions on how to reach your calorie goal.  WHAT IS MY MY PLAN? My goal is to have __________ calories per day.  If I have this many calories per day, I should lose around __________ pounds per week. WHAT DO I NEED TO KNOW ABOUT CALORIE COUNTING? In order to meet your daily calorie goal, you will need to:  Find out how many calories are in each food you would like to eat. Try to do this before you eat.  Decide how much of the food you can eat.  Write down what you ate and how many calories it had. Doing this is called keeping a food log. WHERE DO I FIND CALORIE INFORMATION? The number of calories in a food can be found on a Nutrition Facts label. Note that all the information on a label is  based on a specific serving of the food. If a food does not have a Nutrition Facts label, try to look up the calories online or ask your dietitian for help. HOW DO I DECIDE HOW MUCH TO EAT? To decide how much of the food you can eat, you will need to consider both the number of calories in one serving and the size of one serving. This information can be found on the Nutrition Facts label. If a food does not have a Nutrition Facts label, look up the information online or ask your dietitian for help. Remember that calories are  listed per serving. If you choose to have more than one serving of a food, you will have to multiply the calories per serving by the amount of servings you plan to eat. For example, the label on a package of bread might say that a serving size is 1 slice and that there are 90 calories in a serving. If you eat 1 slice, you will have eaten 90 calories. If you eat 2 slices, you will have eaten 180 calories. HOW DO I KEEP A FOOD LOG? After each meal, record the following information in your food log:  What you ate.  How much of it you ate.  How many calories it had.  Then, add up your calories. Keep your food log near you, such as in a small notebook in your pocket. Another option is to use a mobile app or website. Some programs will calculate calories for you and show you how many calories you have left each time you add an item to the log. WHAT ARE SOME CALORIE COUNTING TIPS?  Use your calories on foods and drinks that will fill you up and not leave you hungry. Some examples of this include foods like nuts and nut butters, vegetables, lean proteins, and high-fiber foods (more than 5 g fiber per serving).  Eat nutritious foods and avoid empty calories. Empty calories are calories you get from foods or beverages that do not have many nutrients, such as candy and soda. It is better to have a nutritious high-calorie food (such as an avocado) than a food with few nutrients (such as a bag of chips).  Know how many calories are in the foods you eat most often. This way, you do not have to look up how many calories they have each time you eat them.  Look out for foods that may seem like low-calorie foods but are really high-calorie foods, such as baked goods, soda, and fat-free candy.  Pay attention to calories in drinks. Drinks such as sodas, specialty coffee drinks, alcohol, and juices have a lot of calories yet do not fill you up. Choose low-calorie drinks like water and diet drinks.  Focus your  calorie counting efforts on higher calorie items. Logging the calories in a garden salad that contains only vegetables is less important than calculating the calories in a milk shake.  Find a way of tracking calories that works for you. Get creative. Most people who are successful find ways to keep track of how much they eat in a day, even if they do not count every calorie. WHAT ARE SOME PORTION CONTROL TIPS?  Know how many calories are in a serving. This will help you know how many servings of a certain food you can have.  Use a measuring cup to measure serving sizes. This is helpful when you start out. With time, you will be able to estimate serving sizes for  some foods.  Take some time to put servings of different foods on your favorite plates, bowls, and cups so you know what a serving looks like.  Try not to eat straight from a bag or box. Doing this can lead to overeating. Put the amount you would like to eat in a cup or on a plate to make sure you are eating the right portion.  Use smaller plates, glasses, and bowls to prevent overeating. This is a quick and easy way to practice portion control. If your plate is smaller, less food can fit on it.  Try not to multitask while eating, such as watching TV or using your computer. If it is time to eat, sit down at a table and enjoy your food. Doing this will help you to start recognizing when you are full. It will also make you more aware of what and how much you are eating. HOW CAN I CALORIE COUNT WHEN EATING OUT?  Ask for smaller portion sizes or child-sized portions.  Consider sharing an entree and sides instead of getting your own entree.  If you get your own entree, eat only half. Ask for a box at the beginning of your meal and put the rest of your entree in it so you are not tempted to eat it.  Look for the calories on the menu. If calories are listed, choose the lower calorie options.  Choose dishes that include vegetables, fruits,  whole grains, low-fat dairy products, and lean protein. Focusing on smart food choices from each of the 5 food groups can help you stay on track at restaurants.  Choose items that are boiled, broiled, grilled, or steamed.  Choose water, milk, unsweetened iced tea, or other drinks without added sugars. If you want an alcoholic beverage, choose a lower calorie option. For example, a regular margarita can have up to 700 calories and a glass of wine has around 150.  Stay away from items that are buttered, battered, fried, or served with cream sauce. Items labeled "crispy" are usually fried, unless stated otherwise.  Ask for dressings, sauces, and syrups on the side. These are usually very high in calories, so do not eat much of them.  Watch out for salads. Many people think salads are a healthy option, but this is often not the case. Many salads come with bacon, fried chicken, lots of cheese, fried chips, and dressing. All of these items have a lot of calories. If you want a salad, choose a garden salad and ask for grilled meats or steak. Ask for the dressing on the side, or ask for olive oil and vinegar or lemon to use as dressing.  Estimate how many servings of a food you are given. For example, a serving of cooked rice is  cup or about the size of half a tennis ball or one cupcake wrapper. Knowing serving sizes will help you be aware of how much food you are eating at restaurants. The list below tells you how big or small some common portion sizes are based on everyday objects.  1 oz--4 stacked dice.  3 oz--1 deck of cards.  1 tsp--1 dice.  1 Tbsp-- a Ping-Pong ball.  2 Tbsp--1 Ping-Pong ball.   cup--1 tennis ball or 1 cupcake wrapper.  1 cup--1 baseball.   This information is not intended to replace advice given to you by your health care provider. Make sure you discuss any questions you have with your health care provider.   Document Released:  12/12/2005 Document Revised: 01/02/2015  Document Reviewed: 10/17/2013 Elsevier Interactive Patient Education [employer].

## 2016-05-06 NOTE — Progress Notes (Signed)
Patient ID: Carmen Edwards, female   DOB: [birth year], 50 y.o.   MRN: [id number] 50 y.o. G85P1011 Married Caucasian female here for annual exam.    On Micronor.  Menses are controlled on OCPs.  Headaches are also well controlled.  Status post right breast bx for PASH.   Wants to loose weight.   States blood sugar levels are normal.  Struggling with stress incontinence.  Using a new pad.  Did PT at [facility].  PCP:   London Pepper, MD.    Patient's last menstrual period was 05/01/2016 (exact date).     Period Cycle (Days): 30 Period Duration (Days): 1-2 days Period Pattern: Regular Menstrual Flow: Light Menstrual Control: Thin pad Dysmenorrhea: None (occ. some cramping)     Sexually active: Yes.  female  The current method of family planning is OCP (Progesterone only)--Micronor.    Exercising: No.   Smoker:  Yes, smokes 10 cigarettes/year  Health Maintenance: Pap:  05-01-15 ASCUS:Pos HR HPV History of abnormal Pap:  Yes,05-01-15 ASCUS:Pos HR HPV; colpo 05-22-15 LGSIL--stable;no txment--rpt pap HPV 85yr. 06-06-14 colposcopy--LSIL; Hx of possible cryotherapy or conization of cervix 1998. MMG:  06-22-15 Density B/poss.asmmetry Rt.br;Diag.Bil.and [place] No sonographic or mammographic evidence of malignancy in the right breast.Probable right breast upper outer quadrant hamartoma. 01-29-16 Rt.br.Bx  PSEUDOANGIOMATOUS STROMAL HYPERPLASIA. Repeat Rt.Diag.6 months:[facility] Colonoscopy:  11/2014 adenomatous polyp with Dr. Cristina Gong. Next due 12 2020 BMD:   n/a  Result  n/a TDaP:  With [place] Gardasil:   N/A   Screening Labs:  Hb today: PCP, Urine today: unable   reports that she has been smoking Cigarettes.  She has been smoking about 0.00 packs per day. She has never used smokeless tobacco. She reports that she does not drink alcohol or use illicit drugs.  Past Medical History  Diagnosis Date  . Hypertension   . High cholesterol   . Hx of chlamydia infection 1989  . GERD  (gastroesophageal reflux disease)   . Migraine     occasional menustral cycle related  . Family history of adverse reaction to anesthesia     mother nausea  . Abnormal Pap smear of cervix     Past Surgical History  Procedure Laterality Date  . Toe surgery      toe nail removed  . Hysteroscopy  01/2012    Dr. Joan Flores  . Fibroid resection  01/2012    Dr. Joan Flores  . Skin lesion excision      --actually frozen by dermatology--benign  . Colonoscopy with propofol N/A 11/28/2014    Procedure: COLONOSCOPY WITH PROPOFOL;  Surgeon: Cleotis Nipper, MD;  Location: [facility];  Service: Endoscopy;  Laterality: N/A;  . Breast surgery  01/2016    Rt.breast bx--?[place]    Current Outpatient Prescriptions  Medication Sig Dispense Refill  . atenolol (TENORMIN) 50 MG tablet Take 50 mg by mouth daily with lunch.     Marland Kitchen atorvastatin (LIPITOR) 10 MG tablet Take 5 mg by mouth at bedtime.     . cholecalciferol (VITAMIN D) 1000 units tablet Take 1,000 Units by mouth daily.    . Cyanocobalamin (VITAMIN B-12) 2500 MCG SUBL Place 1 tablet under the tongue daily.    Marland Kitchen ibuprofen (ADVIL,MOTRIN) 200 MG tablet Take 400 mg by mouth every 6 (six) hours as needed for headache or moderate pain.    . Multiple Vitamin (MULTIVITAMIN) capsule Take 1 capsule by mouth daily.    . norethindrone (MICRONOR,CAMILA,ERRIN) 0.35 MG tablet TAKE ONE TABLET BY  MOUTH ONCE DAILY 28 tablet 2  . omeprazole (PRILOSEC) 20 MG capsule Take 20 mg by mouth daily with lunch.      No current facility-administered medications for this visit.    Family History  Problem Relation Age of Onset  . Cancer - Colon Father   . Hypertension Father   . Hyperlipidemia Father   . Heart disease Father   . Hyperlipidemia Mother   . Cancer - Colon Maternal Grandmother   . Heart disease Paternal Grandfather   . Hypertension Brother   . Heart disease Brother 73    heart attack  . Cancer - Colon Maternal Aunt   . Cancer - Colon Maternal Uncle     ROS:   Pertinent items are noted in HPI.  Otherwise, a comprehensive ROS was negative.  Exam:   BP 130/84 mmHg  Pulse 70  Resp 14  Ht 5' 4.5" (1.638 m)  Wt 226 lb 6.4 oz (102.694 kg)  BMI 38.28 kg/m2  LMP 05/01/2016 (Exact Date)    General appearance: alert, cooperative and appears stated age Head: Normocephalic, without obvious abnormality, atraumatic Neck: no adenopathy, supple, symmetrical, trachea midline and thyroid normal to inspection and palpation Lungs: clear to auscultation bilaterally Breasts: normal appearance, no masses or tenderness, Inspection negative, No nipple retraction or dimpling, No nipple discharge or bleeding, No axillary or supraclavicular adenopathy Heart: regular rate and rhythm Abdomen: incisions:  No.    , soft, non-tender; no masses, no organomegaly Extremities: extremities normal, atraumatic, no cyanosis or edema Skin: Skin color, texture, turgor normal. No rashes or lesions Lymph nodes: Cervical, supraclavicular, and axillary nodes normal. No abnormal inguinal nodes palpated Neurologic: Grossly normal  Pelvic: External genitalia:   Vulvar erythema from mons down the labia bilaterally. Erythema in intertriginous areas as well.               Urethra:  normal appearing urethra with no masses, tenderness or lesions              Bartholins and Skenes: normal                 Vagina: normal appearing vagina with normal color and discharge, no lesions              Cervix: no lesions.  Very mild menstrual flow.               Pap taken: Yes.   Bimanual Exam:  Uterus:  normal size, contour, position, consistency, mobility, non-tender              Adnexa: normal adnexa and no mass, fullness, tenderness              Rectal exam: Yes.  .  Confirms.              Anus:  normal sphincter tone, no lesions  Chaperone was present for exam.  Assessment:   Well woman visit with normal exam. LGSIL.  Smoker. FH of colon cancer.  Personal hx of polyps.  Vulvitis.  Pad use. I  suspect a component of yeast as well.  PASH of right breast.  Genuine stress incontinence.  Plan: Yearly mammogram recommended after age 89.  Due to have dx mammogram in August 2017.  Discussed PASH.  Recommended self breast exam.  Pap and HR HPV as above. Recommendations for Calcium, Vitamin D, regular exercise program including cardiovascular, weight bearing exercise. Labs performed.  No..   Prescription medication(s) given.  Yes.  .  See  orders.  Micronor 3 packs and 3 refills.  Diflucan 150 mg po x 1.  #2, RF none.  Mycolog II cream. Discussed smoking cessation.  Discussed switching to a new pad for incontinence.  Weight loss, PT, and potential surgery discussed for incontinence.   Information on calorie counting and exercise for weight loss.  Follow up annually and prn.       After visit summary provided.

## 2016-05-10 LAB — IPS PAP TEST WITH HPV

## 2016-05-12 ENCOUNTER — Telehealth: Payer: Self-pay | Admitting: *Deleted

## 2016-05-12 NOTE — Telephone Encounter (Signed)
-----   Message from Nunzio Cobbs, MD sent at 05/10/2016  5:05 PM EDT ----- Please inform patient of the good news that her pap is normal and her HR HPV status is negative.  She will be due for a pap and HPV testing in one year due to her hx LGSIL in 2016.  Please enter recall - 08.  Cc- Marisa Sprinkles

## 2016-05-12 NOTE — Telephone Encounter (Signed)
Call to patient, left message to call back, ask for triage nurse.

## 2016-05-13 NOTE — Telephone Encounter (Signed)
Spoke with patient. Advised of results and message as seen below from [place]. She is agreeable and verbalizes understanding. 08 recall placed. Next aex scheduled for 05/19/2017.  Routing to provider for final review. Patient agreeable to disposition. Will close encounter.

## 2016-05-13 NOTE — Telephone Encounter (Signed)
Patient returning call.

## 2016-07-13 ENCOUNTER — Other Ambulatory Visit: Payer: Self-pay | Admitting: Obstetrics and Gynecology

## 2016-07-13 DIAGNOSIS — R928 Other abnormal and inconclusive findings on diagnostic imaging of breast: Secondary | ICD-10-CM

## 2016-07-21 ENCOUNTER — Ambulatory Visit
Admission: RE | Admit: 2016-07-21 | Discharge: 2016-07-21 | Disposition: A | Discharge status: Home / Self Care | Payer: Managed Care, Other (non HMO) | Source: Ambulatory Visit | Attending: Obstetrics and Gynecology | Admitting: Obstetrics and Gynecology

## 2016-07-21 DIAGNOSIS — R928 Other abnormal and inconclusive findings on diagnostic imaging of breast: Secondary | ICD-10-CM

## 2016-07-26 ENCOUNTER — Encounter: Payer: Self-pay | Admitting: Cardiology

## 2016-09-09 ENCOUNTER — Ambulatory Visit: Payer: Managed Care, Other (non HMO) | Admitting: Cardiology

## 2016-09-23 ENCOUNTER — Encounter: Payer: Self-pay | Admitting: Cardiology

## 2016-09-23 ENCOUNTER — Ambulatory Visit (INDEPENDENT_AMBULATORY_CARE_PROVIDER_SITE_OTHER): Payer: Managed Care, Other (non HMO) | Admitting: Cardiology

## 2016-09-23 VITALS — BP 140/82 | HR 79 | Ht 64.0 in | Wt 221.0 lb

## 2016-09-23 DIAGNOSIS — Z23 Encounter for immunization: Secondary | ICD-10-CM

## 2016-09-23 DIAGNOSIS — R072 Precordial pain: Secondary | ICD-10-CM | POA: Diagnosis not present

## 2016-09-23 DIAGNOSIS — I10 Essential (primary) hypertension: Secondary | ICD-10-CM

## 2016-09-23 DIAGNOSIS — R0609 Other forms of dyspnea: Secondary | ICD-10-CM | POA: Diagnosis not present

## 2016-09-23 DIAGNOSIS — Z720 Tobacco use: Secondary | ICD-10-CM

## 2016-09-23 NOTE — Progress Notes (Signed)
Cardiology Office Note  Date: 09/23/2016   ID: TYNISHIA AVARA, DOB 1966/07/29, MRN [id number]  PCP: London Pepper, MD  Consulting Cardiologist: Rozann Lesches, MD   Chief Complaint  Patient presents with  . Dyspnea on exertion    History of Present Illness: Carmen Edwards is a 50 y.o. female referred for cardiology consultation by Dr. Orland Mustard. I reviewed her history. She states that she was experiencing dyspnea on exertion and precordial discomfort approximately 2 months ago. This was occurring when she would walk outdoors to her mailbox, particularly if going uphill. More recently she has been trying to lose weight and walk with her husband for exercise, has felt better without as much shortness of breath. She does have hypertension and hyperlipidemia, also family history of premature CAD. Standard ECGs over time have been reassuring, she has not undergone any further objective ischemic testing.  I reviewed her medications which are outlined below. She is on atenolol and Lipitor, reports no medication intolerances.  Past Medical History:  Diagnosis Date  . Abnormal Pap smear of cervix   . Essential hypertension   . GERD (gastroesophageal reflux disease)   . History of colonic polyps   . History of migraine    Occasional, menustral cycle related  . Hx of chlamydia infection 1989  . Hyperlipidemia     Past Surgical History:  Procedure Laterality Date  . BREAST SURGERY  01/2016   Rt.breast bx--?[place]  . COLONOSCOPY WITH PROPOFOL N/A 11/28/2014   Procedure: COLONOSCOPY WITH PROPOFOL;  Surgeon: Cleotis Nipper, MD;  Location: [facility];  Service: Endoscopy;  Laterality: N/A;  . Fibroid resection  01/2012   Dr. Joan Flores  . HYSTEROSCOPY  01/2012   Dr. Joan Flores  . SKIN LESION EXCISION     --actually frozen by dermatology--benign  . TOE SURGERY     Toe nail removed    Current Outpatient Prescriptions  Medication Sig Dispense Refill  . atenolol (TENORMIN) 50 MG tablet  Take 50 mg by mouth daily with lunch.     Marland Kitchen atorvastatin (LIPITOR) 10 MG tablet Take 5 mg by mouth at bedtime.     . cholecalciferol (VITAMIN D) 1000 units tablet Take 2,000 Units by mouth daily.     . Cyanocobalamin (VITAMIN B-12) 2500 MCG SUBL Place 1 tablet under the tongue daily.    Marland Kitchen ibuprofen (ADVIL,MOTRIN) 200 MG tablet Take 400 mg by mouth every 6 (six) hours as needed for headache or moderate pain.    . Multiple Vitamin (MULTIVITAMIN) capsule Take 1 capsule by mouth daily.    . norethindrone (MICRONOR,CAMILA,ERRIN) 0.35 MG tablet Take 1 tablet (0.35 mg total) by mouth daily. 3 Package 3  . omeprazole (PRILOSEC) 20 MG capsule Take 20 mg by mouth daily with lunch.     . fluconazole (DIFLUCAN) 150 MG tablet Take 1 tablet (150 mg total) by mouth once. Take one tablet.  Repeat in 72 hours if symptoms are not completely resolved. 2 tablet 0   No current facility-administered medications for this visit.    Allergies:  Amoxicillin   Social History: The patient  reports that she has been smoking Cigarettes.  She has been smoking about 0.00 packs per day. She has never used smokeless tobacco. She reports that she does not drink alcohol or use drugs.   Family History: The patient's family history includes Cancer - Colon in her father, maternal aunt, maternal grandmother, and maternal uncle; Heart disease in her father and paternal grandfather; Heart disease (age of  onset: 34) in her brother; Hyperlipidemia in her father and mother; Hypertension in her brother and father.   ROS:  Please see the history of present illness. Otherwise, complete review of systems is positive for none.  All other systems are reviewed and negative.   Physical Exam: VS:  BP 140/82   Pulse 79   Ht 5\' 4"  (1.626 m)   Wt 221 lb (100.2 kg)   SpO2 99%   BMI 37.93 kg/m , BMI Body mass index is 37.93 kg/m.  Wt Readings from Last 3 Encounters:  09/23/16 221 lb (100.2 kg)  05/06/16 226 lb 6.4 oz (102.7 kg)  05/22/15 229  lb (103.9 kg)    General: Overweight woman, appears comfortable at rest. HEENT: Conjunctiva and lids normal, oropharynx clear. Neck: Supple, no elevated JVP or carotid bruits, no thyromegaly. Lungs: Clear to auscultation, nonlabored breathing at rest. Cardiac: Regular rate and rhythm, no S3 or significant systolic murmur, no pericardial rub. Abdomen: Soft, nontender, bowel sounds present, no guarding or rebound. Extremities: No pitting edema, distal pulses 2+. Skin: Warm and dry. Musculoskeletal: No kyphosis. Neuropsychiatric: Alert and oriented x3, affect grossly appropriate.  ECG: I personally reviewed the tracing from 08/21/2012 which showed normal sinus rhythm.  Recent Labwork:  July 2017: Hemoglobin 13.9, platelets 416, BUN 7, creatinine 0.7, potassium 5.0, AST 19, ALT 21, TSH 2.27, cholesterol 164, triglycerides 106, LDL 100  Assessment and Plan:  1. Dyspnea on exertion and precordial discomfort in a 50 year old woman with hypertension, hyperlipidemia, and family history of premature CAD. She also has history of intermittent tobacco use. She has not undergone any objective ischemic evaluations, and we will proceed with an exercise Myoview for further assessment (hold atenolol day of the procedure).  2. Essential hypertension, on atenolol.  3. Hyperlipidemia, on Lipitor. LDL 100 and July.  4. History of intermittent tobacco use.  Current medicines were reviewed with the patient today.   Orders Placed This Encounter  Procedures  . NM Myocar Multi W/Spect W/Wall Motion / EF  . Flu Vaccine QUAD 36+ mos IM    Disposition: Call with test results.  Signed, Satira Sark, MD, [facility] 09/23/2016 11:39 AM    [facility]. [address], [place],  [address] Phone: [phone]; Fax: [phone]

## 2016-09-23 NOTE — Patient Instructions (Signed)
Medication Instructions:  Your physician recommends that you continue on your current medications as directed. Please refer to the Current Medication list given to you today.   Labwork: none  Testing/Procedures: Your physician has requested that you have en exercise stress myoview. For further information please visit [email or web address]. Please follow instruction sheet, as given.    Follow-Up: Your physician recommends that you schedule a follow-up appointment in: to be determined based on test    Any Other Special Instructions Will Be Listed Below (If Applicable).  You have been given your flu shot today    If you need a refill on your cardiac medications before your next appointment, please call your pharmacy.

## 2016-09-30 ENCOUNTER — Encounter (HOSPITAL_COMMUNITY)
Admission: RE | Admit: 2016-09-30 | Discharge: 2016-09-30 | Disposition: A | Discharge status: Home / Self Care | Payer: Managed Care, Other (non HMO) | Source: Ambulatory Visit | Attending: Cardiology | Admitting: Cardiology

## 2016-09-30 ENCOUNTER — Encounter (HOSPITAL_COMMUNITY): Payer: Self-pay

## 2016-09-30 ENCOUNTER — Inpatient Hospital Stay (HOSPITAL_COMMUNITY): Admission: RE | Admit: 2016-09-30 | Payer: Managed Care, Other (non HMO) | Source: Ambulatory Visit

## 2016-09-30 DIAGNOSIS — R072 Precordial pain: Secondary | ICD-10-CM | POA: Diagnosis present

## 2016-09-30 LAB — NM MYOCAR MULTI W/SPECT W/WALL MOTION / EF
CHL CUP NUCLEAR SDS: 11
CHL CUP NUCLEAR SRS: 7
CHL CUP NUCLEAR SSS: 18
CHL CUP RESTING HR STRESS: 64 {beats}/min
CHL RATE OF PERCEIVED EXERTION: 14
CSEPED: 7 min
CSEPEDS: 28 s
CSEPEW: 10.4 METS
CSEPPHR: 157 {beats}/min
LV dias vol: 79 mL (ref 46–106)
LVSYSVOL: 32 mL
MPHR: 172 {beats}/min
NUC STRESS TID: 1.07
Percent HR: 91 %
RATE: 0.39

## 2016-09-30 MED ORDER — TECHNETIUM TC 99M TETROFOSMIN IV KIT
30.0000 | PACK | Freq: Once | INTRAVENOUS | Status: AC | PRN
  Administered 2016-09-30: 30.2 via INTRAVENOUS

## 2016-09-30 MED ORDER — REGADENOSON 0.4 MG/5ML IV SOLN
INTRAVENOUS | Status: AC
  Filled 2016-09-30: qty 5

## 2016-09-30 MED ORDER — SODIUM CHLORIDE 0.9% FLUSH
INTRAVENOUS | Status: AC
  Administered 2016-09-30: 10 mL via INTRAVENOUS
  Filled 2016-09-30: qty 10

## 2016-09-30 MED ORDER — TECHNETIUM TC 99M TETROFOSMIN IV KIT
10.0000 | PACK | Freq: Once | INTRAVENOUS | Status: AC | PRN
  Administered 2016-09-30: 10.8 via INTRAVENOUS

## 2017-05-19 ENCOUNTER — Encounter: Payer: Self-pay | Admitting: Obstetrics and Gynecology

## 2017-05-19 ENCOUNTER — Other Ambulatory Visit (HOSPITAL_COMMUNITY)
Admission: RE | Admit: 2017-05-19 | Discharge: 2017-05-19 | Disposition: A | Discharge status: Home / Self Care | Payer: Managed Care, Other (non HMO) | Source: Ambulatory Visit | Attending: Obstetrics and Gynecology | Admitting: Obstetrics and Gynecology

## 2017-05-19 ENCOUNTER — Ambulatory Visit (INDEPENDENT_AMBULATORY_CARE_PROVIDER_SITE_OTHER): Payer: Managed Care, Other (non HMO) | Admitting: Obstetrics and Gynecology

## 2017-05-19 VITALS — BP 114/60 | HR 88 | Resp 16 | Ht 64.75 in | Wt 225.0 lb

## 2017-05-19 DIAGNOSIS — Z1211 Encounter for screening for malignant neoplasm of colon: Secondary | ICD-10-CM | POA: Diagnosis not present

## 2017-05-19 DIAGNOSIS — Z124 Encounter for screening for malignant neoplasm of cervix: Secondary | ICD-10-CM

## 2017-05-19 DIAGNOSIS — Z01419 Encounter for gynecological examination (general) (routine) without abnormal findings: Secondary | ICD-10-CM | POA: Diagnosis not present

## 2017-05-19 MED ORDER — NORETHINDRONE 0.35 MG PO TABS: 1.0000 | ORAL_TABLET | Freq: Every day | ORAL | 3 refills | Status: DC

## 2017-05-19 NOTE — Patient Instructions (Signed)

## 2017-05-19 NOTE — Progress Notes (Signed)
51 y.o. G71P1011 Married Caucasian female here for annual exam.    On Camilla.  Wants to continue. HA and nausea with cycles.  Light menstruation. Some cramping treated with Ibuprofen.  Had sone PT at [facility] for stress incontinence.  This helped.  Wearing a light pad due to light leakage. Not interested in surgery.  Labs with PCP.  PCP: Dr. London Pepper - [name] at [facility].    Patient's last menstrual period was 05/04/2017.           Sexually active: Yes.    The current method of family planning is OCP - Micronor (estrogen/progesterone).    Exercising: No.  The patient does not participate in regular exercise at present.  Works on her farm. Smoker:  Yes -- ocassionally   Health Maintenance: Pap:  05/06/16 Pap and HR HPV negative  05-01-15 ASCUS:Pos HR HPV History of abnormal Pap:  Yes,05-01-15 ASCUS:Pos HR HPV; colpo 05-22-15 LGSIL--stable;no txment--rpt pap HPV 64yr. 06-06-14 colposcopy--LSIL; Hx of possible cryotherapy or conization of cervix 1998. MMG:  07/21/16 BIRADS 2 Benign/density b.  Hx right breast biopsy with PASH.  [facility] Colonoscopy:  11/2014 adenomatous polyp with Dr. Cristina Gong. Next due 12 2020 BMD:   n/a  Result  n/a TDaP:  UTD with [facility] Gardasil:   N/a HIV: years ago per patient Screening Labs:  Hb today: PCP takes care of labs   reports that she has been smoking Cigarettes.  She has been smoking about 0.00 packs per day. She has never used smokeless tobacco. She reports that she does not drink alcohol or use drugs.  Past Medical History:  Diagnosis Date  . Abnormal Pap smear of cervix   . Essential hypertension   . GERD (gastroesophageal reflux disease)   . History of colonic polyps   . History of migraine    Occasional, menustral cycle related  . Hx of chlamydia infection 1989  . Hyperlipidemia     Past Surgical History:  Procedure Laterality Date  . BREAST SURGERY  01/2016   Rt.breast bx--?[place]  . COLONOSCOPY WITH  PROPOFOL N/A 11/28/2014   Procedure: COLONOSCOPY WITH PROPOFOL;  Surgeon: Cleotis Nipper, MD;  Location: [facility];  Service: Endoscopy;  Laterality: N/A;  . Fibroid resection  01/2012   Dr. Joan Flores  . HYSTEROSCOPY  01/2012   Dr. Joan Flores  . SKIN LESION EXCISION     --actually frozen by dermatology--benign  . TOE SURGERY     Toe nail removed    Current Outpatient Prescriptions  Medication Sig Dispense Refill  . atenolol (TENORMIN) 50 MG tablet Take 50 mg by mouth daily with lunch.     Marland Kitchen atorvastatin (LIPITOR) 10 MG tablet Take 5 mg by mouth at bedtime.     . cholecalciferol (VITAMIN D) 1000 units tablet Take 2,000 Units by mouth daily.     . Cyanocobalamin (VITAMIN B-12) 2500 MCG SUBL Take 1 tablet by mouth daily.     Marland Kitchen ibuprofen (ADVIL,MOTRIN) 200 MG tablet Take 400 mg by mouth every 6 (six) hours as needed for headache or moderate pain.    . Multiple Vitamin (MULTIVITAMIN) capsule Take 1 capsule by mouth daily.    . norethindrone (MICRONOR,CAMILA,ERRIN) 0.35 MG tablet Take 1 tablet (0.35 mg total) by mouth daily. 3 Package 3  . omeprazole (PRILOSEC) 20 MG capsule Take 20 mg by mouth daily with lunch.      No current facility-administered medications for this visit.     Family History  Problem Relation  Age of Onset  . Cancer - Colon Father   . Hypertension Father   . Hyperlipidemia Father   . Heart disease Father   . Hyperlipidemia Mother   . Hypertension Brother   . Heart disease Brother 72       Heart attack  . Cancer - Colon Maternal Grandmother   . Heart disease Paternal Grandfather   . Cancer - Colon Maternal Aunt   . Cancer - Colon Maternal Uncle     ROS:  Pertinent items are noted in HPI.  Otherwise, a comprehensive ROS was negative.  Exam:   BP 114/60 (BP Location: Right Arm, Patient Position: Sitting, Cuff Size: Large)   Pulse 88   Resp 16   Ht 5' 4.75" (1.645 m)   Wt 225 lb (102.1 kg)   LMP 05/04/2017   BMI 37.73 kg/m     General appearance: alert,  cooperative and appears stated age Head: Normocephalic, without obvious abnormality, atraumatic Neck: no adenopathy, supple, symmetrical, trachea midline and thyroid normal to inspection and palpation Lungs: clear to auscultation bilaterally Breasts: normal appearance, no masses or tenderness, No nipple retraction or dimpling, No nipple discharge or bleeding, No axillary or supraclavicular adenopathy Heart: regular rate and rhythm Abdomen: soft, non-tender; no masses, no organomegaly Extremities: extremities normal, atraumatic, no cyanosis or edema Skin: Skin color, texture, turgor normal. No rashes or lesions Lymph nodes: Cervical, supraclavicular, and axillary nodes normal. No abnormal inguinal nodes palpated Neurologic: Grossly normal  Pelvic: External genitalia:  no lesions              Urethra:  normal appearing urethra with no masses, tenderness or lesions              Bartholins and Skenes: normal                 Vagina: normal appearing vagina with normal color and discharge, no lesions              Cervix: no lesions              Pap taken: Yes.   Bimanual Exam:  Uterus:  normal size, contour, position, consistency, mobility, non-tender              Adnexa: no mass, fullness, tenderness              Rectal exam: Yes.  .  Confirms.              Anus:  normal sphincter tone, no lesions  Chaperone was present for exam.  Assessment:   Well woman visit with normal exam. LGSIL.  Smoker. FH of colon cancer.  Personal hx of polyps.  PASH of right breast.  Genuine stress incontinence.  Plan: Mammogram screening discussed. Recommended self breast awareness. Pap and HR HPV as above. Guidelines for Calcium, Vitamin D, regular exercise program including cardiovascular and weight bearing exercise. I discussed weight loss with her. Refill of Micronor for one year. IFOB. I discussed genetic testing due to her hx of colon cancer.  She declines. Follow up annually and prn.        After visit summary provided.

## 2017-05-24 LAB — CYTOLOGY - PAP
Diagnosis: NEGATIVE
HPV: NOT DETECTED

## 2017-05-25 ENCOUNTER — Telehealth: Payer: Self-pay | Admitting: Obstetrics and Gynecology

## 2017-05-25 NOTE — Telephone Encounter (Signed)
Patient returning your call.

## 2017-05-25 NOTE — Telephone Encounter (Signed)
Left message to call Kaitlyn at [phone]. 

## 2017-05-25 NOTE — Telephone Encounter (Signed)
Return call to Kaitlyn. °

## 2017-05-25 NOTE — Telephone Encounter (Signed)
Please contact patient in follow up to her IFOB given to her at her recent annual exam. I do not have a result in EPIC yet, and it came up in my reminder box. Did she complete this yet? I told her that she may receive a reminder phone call from [place].

## 2017-05-25 NOTE — Telephone Encounter (Signed)
Spoke with patient. Patient states that she will complete IFOB testing this weekend and return testing to the office.  Routing to provider for final review. Patient agreeable to disposition. Will close encounter.

## 2017-06-12 ENCOUNTER — Telehealth: Payer: Self-pay | Admitting: Obstetrics and Gynecology

## 2017-06-12 LAB — FECAL OCCULT BLOOD, IMMUNOCHEMICAL: IMMUNOLOGICAL FECAL OCCULT BLOOD TEST: NEGATIVE

## 2017-06-12 NOTE — Telephone Encounter (Signed)
IFOB received, not resulted.   Terence Lux, can you result IFOB?

## 2017-06-12 NOTE — Telephone Encounter (Signed)
Negative IFOB result sent to triage as a result note. Triage will report to patient.

## 2017-06-12 NOTE — Telephone Encounter (Signed)
Please contact patient to have her complete her IFOB if this was not already done.  She came up in my reminder box in EPIC that this was still not completed.

## 2017-06-12 NOTE — Telephone Encounter (Signed)
Ifob has been resulted and results are in EPIC.  Thanks.

## 2017-07-13 ENCOUNTER — Other Ambulatory Visit: Payer: Self-pay | Admitting: Obstetrics and Gynecology

## 2017-07-13 DIAGNOSIS — Z1231 Encounter for screening mammogram for malignant neoplasm of breast: Secondary | ICD-10-CM

## 2017-07-31 ENCOUNTER — Ambulatory Visit
Admission: RE | Admit: 2017-07-31 | Discharge: 2017-07-31 | Disposition: A | Discharge status: Home / Self Care | Payer: Managed Care, Other (non HMO) | Source: Ambulatory Visit | Attending: Obstetrics and Gynecology | Admitting: Obstetrics and Gynecology

## 2017-07-31 DIAGNOSIS — Z1231 Encounter for screening mammogram for malignant neoplasm of breast: Secondary | ICD-10-CM

## 2018-05-25 ENCOUNTER — Ambulatory Visit: Payer: Managed Care, Other (non HMO) | Admitting: Obstetrics and Gynecology

## 2018-06-05 ENCOUNTER — Ambulatory Visit (INDEPENDENT_AMBULATORY_CARE_PROVIDER_SITE_OTHER): Payer: 59 | Admitting: Obstetrics and Gynecology

## 2018-06-05 ENCOUNTER — Other Ambulatory Visit: Payer: Self-pay

## 2018-06-05 ENCOUNTER — Encounter: Payer: Self-pay | Admitting: Obstetrics and Gynecology

## 2018-06-05 ENCOUNTER — Other Ambulatory Visit (HOSPITAL_COMMUNITY)
Admission: RE | Admit: 2018-06-05 | Discharge: 2018-06-05 | Disposition: A | Discharge status: Home / Self Care | Payer: 59 | Source: Ambulatory Visit | Attending: Obstetrics and Gynecology | Admitting: Obstetrics and Gynecology

## 2018-06-05 VITALS — BP 132/78 | HR 80 | Resp 16 | Ht 65.25 in | Wt 232.0 lb

## 2018-06-05 DIAGNOSIS — Z01419 Encounter for gynecological examination (general) (routine) without abnormal findings: Secondary | ICD-10-CM | POA: Diagnosis not present

## 2018-06-05 DIAGNOSIS — Z01411 Encounter for gynecological examination (general) (routine) with abnormal findings: Secondary | ICD-10-CM | POA: Diagnosis not present

## 2018-06-05 DIAGNOSIS — N760 Acute vaginitis: Secondary | ICD-10-CM

## 2018-06-05 DIAGNOSIS — Z1151 Encounter for screening for human papillomavirus (HPV): Secondary | ICD-10-CM | POA: Diagnosis not present

## 2018-06-05 MED ORDER — NORETHINDRONE 0.35 MG PO TABS: 1.0000 | ORAL_TABLET | Freq: Every day | ORAL | 3 refills | Status: DC

## 2018-06-05 MED ORDER — NYSTATIN-TRIAMCINOLONE 100000-0.1 UNIT/GM-% EX OINT: 1.0000 "application " | TOPICAL_OINTMENT | Freq: Two times a day (BID) | CUTANEOUS | 0 refills | Status: DC

## 2018-06-05 NOTE — Patient Instructions (Signed)

## 2018-06-05 NOTE — Progress Notes (Signed)
52 y.o. G34P1011 Married Caucasian female here for annual exam.    Wants to talk about menopause.  Menses almost nonexistent these days per patient.  Thinks the bleeding is one or two days, light pink to light brown.  Some dryness and discomfort with sex.  Occasional itching that comes and goes.  (Also had abx recently but her symptoms has resolved.) Hot flashes are infrequent and not unbearable.  They are not daily or nightly.   Still some leakage of urine with cough.  Uses a panty.  This is under control per patient.  Kegel's help.  Labs with PCP.   PCP: Dr. London Pepper    Patient's last menstrual period was 05/21/2018 (within days).           Sexually active: Yes.    The current method of family planning is Micronor.    Exercising: No.  The patient does not participate in regular exercise at present. Smoker:  Yes, occasionally   Health Maintenance: Pap: 05/19/17 Pap and HR HPV negative          05/06/16 Pap and HR HPV negative          05-01-15 ASCUS:Pos HR HPV History of abnormal Pap:  Yes,05-01-15 ASCUS:Pos HR HPV; colpo 05-22-15 LGSIL--stable;no txment--rpt pap HPV 61yr. 06-06-14 colposcopy--LSIL; Hx of possible cryotherapy or conization of cervix 1998. MMG:  07/31/17 BIRADS 1 negative/density b Colonoscopy:  11/2014 adenomatous polyp with Dr. Cristina Gong. Next due 12 2020 BMD:   n/a  Result  n/a TDaP: unsure.  Will do with PCP.  Gardasil:   n/a HIV: negative years ago Hep C: never Screening Labs: PCP   reports that she has been smoking cigarettes.  She has been smoking about 0.00 packs per day. She has never used smokeless tobacco. She reports that she does not drink alcohol or use drugs.  Past Medical History:  Diagnosis Date  . Abnormal Pap smear of cervix   . Essential hypertension   . GERD (gastroesophageal reflux disease)   . History of colonic polyps   . History of migraine    Occasional, menustral cycle related  . Hx of chlamydia infection 1989  . Hyperlipidemia      Past Surgical History:  Procedure Laterality Date  . BREAST BIOPSY Right   . BREAST SURGERY  01/2016   Rt.breast bx--?[place]  . COLONOSCOPY WITH PROPOFOL N/A 11/28/2014   Procedure: COLONOSCOPY WITH PROPOFOL;  Surgeon: Cleotis Nipper, MD;  Location: [facility];  Service: Endoscopy;  Laterality: N/A;  . Fibroid resection  01/2012   Dr. Joan Flores  . HYSTEROSCOPY  01/2012   Dr. Joan Flores  . SKIN LESION EXCISION     --actually frozen by dermatology--benign  . TOE SURGERY     Toe nail removed    Current Outpatient Medications  Medication Sig Dispense Refill  . atenolol (TENORMIN) 50 MG tablet Take 50 mg by mouth daily with lunch.     Marland Kitchen atorvastatin (LIPITOR) 10 MG tablet Take 5 mg by mouth at bedtime.     . cholecalciferol (VITAMIN D) 1000 units tablet Take 2,000 Units by mouth daily.     . Cyanocobalamin (VITAMIN B-12) 2500 MCG SUBL Take 1 tablet by mouth daily.     Marland Kitchen ibuprofen (ADVIL,MOTRIN) 200 MG tablet Take 400 mg by mouth every 6 (six) hours as needed for headache or moderate pain.    . Multiple Vitamin (MULTIVITAMIN) capsule Take 1 capsule by mouth daily.    . norethindrone (MICRONOR,CAMILA,ERRIN) 0.35 MG tablet Take  1 tablet (0.35 mg total) by mouth daily. 3 Package 3  . omeprazole (PRILOSEC) 20 MG capsule Take 20 mg by mouth daily with lunch.     Marland Kitchen LORazepam (ATIVAN) 1 MG tablet   0  . TRANSDERM-SCOP, 1.5 MG, 1 MG/3DAYS   0   No current facility-administered medications for this visit.     Family History  Problem Relation Age of Onset  . Cancer - Colon Father   . Hypertension Father   . Hyperlipidemia Father   . Heart disease Father   . Hyperlipidemia Mother   . Hypertension Brother   . Heart disease Brother 66       Heart attack  . Cancer - Colon Maternal Grandmother   . Heart disease Paternal Grandfather   . Cancer - Colon Maternal Aunt   . Cancer - Colon Maternal Uncle     Review of Systems  Constitutional: Negative.   HENT: Negative.   Eyes: Negative.    Respiratory: Negative.   Cardiovascular: Negative.   Gastrointestinal: Negative.   Endocrine: Negative.   Genitourinary: Negative.   Musculoskeletal: Negative.   Skin: Negative.   Allergic/Immunologic: Negative.   Neurological: Negative.   Hematological: Negative.   Psychiatric/Behavioral: Negative.     Exam:   BP 132/78 (BP Location: Right Arm, Patient Position: Sitting, Cuff Size: Large)   Pulse 80   Resp 16   Ht 5' 5.25" (1.657 m)   Wt 232 lb (105.2 kg)   LMP 05/21/2018 (Within Days)   BMI 38.31 kg/m     General appearance: alert, cooperative and appears stated age Head: Normocephalic, without obvious abnormality, atraumatic Neck: no adenopathy, supple, symmetrical, trachea midline and thyroid normal to inspection and palpation Lungs: clear to auscultation bilaterally Breasts: normal appearance, no masses or tenderness, No nipple retraction or dimpling, No nipple discharge or bleeding, No axillary or supraclavicular adenopathy Heart: regular rate and rhythm Abdomen: soft, non-tender; no masses, no organomegaly Extremities: extremities normal, atraumatic, no cyanosis or edema Skin: Skin color, texture, turgor normal. No rashes or lesions Lymph nodes: Cervical, supraclavicular, and axillary nodes normal. No abnormal inguinal nodes palpated Neurologic: Grossly normal  Pelvic: External genitalia:   Erythema of vulva.              Urethra:  normal appearing urethra with no masses, tenderness or lesions              Bartholins and Skenes: normal                 Vagina: normal appearing vagina with normal color and small white clumpy discharge, no lesions              Cervix: no lesions              Pap taken: Yes.   Bimanual Exam:  Uterus:  normal size, contour, position, consistency, mobility, non-tender              Adnexa: no mass, fullness, tenderness              Rectal exam: Yes.  .  Confirms.              Anus:  normal sphincter tone, no lesions  Chaperone was  present for exam.  Assessment:   Well woman visit with normal exam. Perimenopausal. LGSIL.  Smoker.  Occasional.  FH of colon cancer. Personal hx of polyps.  PASH of right breast.  Genuine stress incontinence. Vulvovaginitis.   Plan: Mammogram screening. Recommended self  breast awareness. Pap and HR HPV as above. Guidelines for Calcium, Vitamin D, regular exercise program including cardiovascular and weight bearing exercise. Brochure on menopause.  Comprehensive discussion today as well.  Declines Rx for menopausal symptoms.  Refill of Micronor for one year.  Mycolog II.  Switch pads.  Discussed smoking cessation.  Discussed benefits of weight loss.  Questions invited and answered.  Follow up annually and prn.    After visit summary provided.

## 2018-06-07 LAB — CYTOLOGY - PAP
BACTERIAL VAGINITIS: NEGATIVE
CANDIDA VAGINITIS: POSITIVE — AB
Diagnosis: NEGATIVE
HPV: NOT DETECTED
TRICH (WINDOWPATH): NEGATIVE

## 2018-06-07 MED ORDER — FLUCONAZOLE 150 MG PO TABS: 150.0000 mg | ORAL_TABLET | Freq: Once | ORAL | 0 refills | Status: AC

## 2018-06-07 NOTE — Addendum Note (Signed)
Addended by: Yisroel Ramming, Dietrich Pates E on: 06/07/2018 07:14 PM   Modules accepted: Orders

## 2018-08-24 ENCOUNTER — Ambulatory Visit: Payer: 59 | Admitting: Podiatry

## 2018-08-24 ENCOUNTER — Encounter: Payer: Self-pay | Admitting: Podiatry

## 2018-08-24 DIAGNOSIS — L600 Ingrowing nail: Secondary | ICD-10-CM

## 2018-08-24 NOTE — Patient Instructions (Signed)

## 2018-08-24 NOTE — Progress Notes (Signed)
Subjective:   Patient ID: Carmen Edwards, female   DOB: 52 y.o.   MRN: [id number]   HPI Patient states she is had a chronic ingrown toenail deformity of her left big toe and it hurts almost all the time and she tries to soak it trim it without relief his symptoms and its been present for a long time.  Patient does not smoke and likes to be active   Review of Systems  All other systems reviewed and are negative.       Objective:  Physical Exam  Neurovascular status intact muscle strength adequate range of motion within normal limits with incurvated left hallux lateral border that is very painful when pressed and making shoe gear difficult.  Patient is noted to have good digital perfusion and is well oriented x3     Assessment:  Ingrown toenail deformity left hallux lateral border with pain     Plan:  H&P condition reviewed and recommended removal of the border.  Patient wants procedure and I explained procedure with risk and I explained the procedure that will be done and she signed consent form.  Today I infiltrated the left hallux 60 mg like Marcaine mixture remove the lateral border exposed matrix and applied phenol 3 applications 30 seconds followed by alcohol lavage sterile dressing and I gave instructions to contact us or call if any issues should occur

## 2018-08-24 NOTE — Progress Notes (Signed)
   Subjective:    Patient ID: Carmen Edwards, female    DOB: 10-03-1966, 52 y.o.   MRN: [id number]  HPI    Review of Systems  All other systems reviewed and are negative.      Objective:   Physical Exam        Assessment & Plan:

## 2018-09-07 ENCOUNTER — Encounter: Payer: Self-pay | Admitting: Podiatry

## 2018-09-07 ENCOUNTER — Ambulatory Visit: Payer: Self-pay | Admitting: Podiatry

## 2018-09-07 DIAGNOSIS — L600 Ingrowing nail: Secondary | ICD-10-CM

## 2018-09-08 NOTE — Progress Notes (Signed)
Subjective:   Patient ID: Carmen Edwards, female   DOB: 52 y.o.   MRN: [id number]   HPI Patient states overall doing well but had a little bit of redness at the base and was just concerned   ROS      Objective:  Physical Exam  Neurovascular status intact with patient's left hallux showing a localized amount of crusted tissue but it is local with no proximal edema erythema no obvious drainage or odor     Assessment:  Localized irritation but does not appear to be active     Plan:  Instructed on continued soaks and if any further redness drainage or pain were to occur she is to contact us immediately and if any systemic signs of infection were to occur she is to go straight to the emergency room

## 2018-09-19 ENCOUNTER — Other Ambulatory Visit: Payer: Self-pay | Admitting: Obstetrics and Gynecology

## 2018-09-19 DIAGNOSIS — Z1231 Encounter for screening mammogram for malignant neoplasm of breast: Secondary | ICD-10-CM

## 2018-10-22 ENCOUNTER — Ambulatory Visit
Admission: RE | Admit: 2018-10-22 | Discharge: 2018-10-22 | Disposition: A | Discharge status: Home / Self Care | Payer: 59 | Source: Ambulatory Visit | Attending: Obstetrics and Gynecology | Admitting: Obstetrics and Gynecology

## 2018-10-22 DIAGNOSIS — Z1231 Encounter for screening mammogram for malignant neoplasm of breast: Secondary | ICD-10-CM

## 2019-05-12 ENCOUNTER — Other Ambulatory Visit: Payer: Self-pay | Admitting: Obstetrics and Gynecology

## 2019-05-13 NOTE — Telephone Encounter (Signed)
Medication refill request: micronor Last AEX:  06-05-18 Next AEX: 06-14-2019 Last MMG (if hormonal medication request): 10-22-18 category a density birads 1:neg Refill authorized: please approve if appropriate

## 2019-05-27 ENCOUNTER — Telehealth: Payer: Self-pay | Admitting: Obstetrics and Gynecology

## 2019-05-27 NOTE — Telephone Encounter (Signed)
Left message on voicemail to call and reschedule cancelled appointment. °

## 2019-06-12 ENCOUNTER — Other Ambulatory Visit: Payer: Self-pay | Admitting: Obstetrics and Gynecology

## 2019-06-13 ENCOUNTER — Other Ambulatory Visit: Payer: Self-pay | Admitting: Obstetrics and Gynecology

## 2019-06-13 NOTE — Telephone Encounter (Signed)
Medication refill request: OCP Last AEX:  06/05/18 BS Next AEX: 08/09/19 Last MMG (if hormonal medication request): 10/22/18 BIRADS 1 negative/density a Refill authorized: Please refill if appropriate. Order pended #84 w/0 refills if authorized to get patient to AEX

## 2019-06-13 NOTE — Telephone Encounter (Signed)
Patient in need of birth control refill. [place] [address] in [place].

## 2019-06-14 ENCOUNTER — Ambulatory Visit: Payer: 59 | Admitting: Obstetrics and Gynecology

## 2019-06-14 NOTE — Telephone Encounter (Signed)
Patient called asking for the status of her refill request. I told her that the request is still pending the MD's approval. She stated that will be out of her medication on Sunday and asked if this could be called in today?

## 2019-06-14 NOTE — Telephone Encounter (Signed)
Forwarding to Dr. Sabra Heck per Seth Bake.

## 2019-06-15 ENCOUNTER — Other Ambulatory Visit: Payer: Self-pay | Admitting: Obstetrics and Gynecology

## 2019-06-15 MED ORDER — NORETHINDRONE 0.35 MG PO TABS: 1.0000 | ORAL_TABLET | Freq: Every day | ORAL | 0 refills | Status: DC

## 2019-06-17 NOTE — Telephone Encounter (Signed)
Patient is calling regarding refill prescription. Patient stated that she has missed one dose.

## 2019-06-17 NOTE — Telephone Encounter (Signed)
Prescription was filled on 06/15/19 by me.

## 2019-06-17 NOTE — Telephone Encounter (Signed)
Call placed to [facility], confirmed Rx for Micronor received on 06/15/19.   Call returned to patient. Patient states she was notified of refill by Sophia, patient has picked up Rx.  Patient thankful for return call.   Encounter closed.

## 2019-06-17 NOTE — Telephone Encounter (Signed)
Med refill request: Micronor 0.35mg  tab Last AEX: 06/05/18 Next AEX: 08/09/19 Last MMG (if hormonal med): 10/22/18 at [facility], neg Refill authorized: Please Advise? Per review of AEX dated 06/05/18, patient is perimenopausal.   Dr. Quincy Simmonds -please advise on refill.

## 2019-07-18 ENCOUNTER — Telehealth: Payer: Self-pay | Admitting: Obstetrics and Gynecology

## 2019-07-18 NOTE — Telephone Encounter (Signed)
Left message on voicemail to call and reschedule cancelled appointment. °

## 2019-07-29 ENCOUNTER — Other Ambulatory Visit: Payer: Self-pay

## 2019-07-29 ENCOUNTER — Other Ambulatory Visit: Payer: 59

## 2019-07-29 DIAGNOSIS — Z20822 Contact with and (suspected) exposure to covid-19: Secondary | ICD-10-CM

## 2019-07-30 LAB — NOVEL CORONAVIRUS, NAA: SARS-CoV-2, NAA: NOT DETECTED

## 2019-08-09 ENCOUNTER — Ambulatory Visit: Payer: 59 | Admitting: Obstetrics and Gynecology

## 2019-09-01 ENCOUNTER — Other Ambulatory Visit: Payer: Self-pay | Admitting: Obstetrics and Gynecology

## 2019-09-03 NOTE — Telephone Encounter (Signed)
Medication refill request: norethindrone Last AEX:  06-05-18 BS  Next AEX: 09-25-2019 Last MMG (if hormonal medication request): 10-22-18 density A/BIRADS 1 negative  Refill authorized: Today, please advise.   Medication pended for #28, 0RF. Please refill if appropriate.

## 2019-09-19 ENCOUNTER — Other Ambulatory Visit: Payer: Self-pay | Admitting: Family Medicine

## 2019-09-19 ENCOUNTER — Ambulatory Visit
Admission: RE | Admit: 2019-09-19 | Discharge: 2019-09-19 | Disposition: A | Discharge status: Home / Self Care | Payer: 59 | Source: Ambulatory Visit | Attending: Family Medicine | Admitting: Family Medicine

## 2019-09-19 ENCOUNTER — Other Ambulatory Visit: Payer: Self-pay

## 2019-09-19 DIAGNOSIS — M546 Pain in thoracic spine: Secondary | ICD-10-CM

## 2019-09-24 NOTE — Progress Notes (Deleted)
53 y.o. G20P1011 Married Caucasian female here for annual exam.    PCP:     No LMP recorded.           Sexually active: {yes no:314532}  The current method of family planning is {contraception:315051}.    Exercising: {yes no:314532}  {types:19826} Smoker:  {YES NO:22349}  Health Maintenance: Pap: 06-05-18 Neg:Neg HR HPV, 05/19/17 Neg:Neg HR HPV, 05-06-16 Neg:Neg HR HPV History of abnormal Pap:  Yes, 05-01-15 ASCUS:Pos HR HPV; colpo 05-22-15 LGSIL--stable;no txment--rpt pap HPV 65yr. 06-06-14 colposcopy--LSIL; Hx of possible cryotherapy or conization of cervix 1998. MMG: 10-22-18 3D/Neg/density A/BiRads1 Colonoscopy:  11/2014 adenomatous polyp with Dr. Cristina Gong. Next due 12 2020 BMD:   ***  Result  ***  TDaP:  ***?with PCP Gardasil:   n/a HIV: Neg years ago Hep C: never Screening Labs:  Hb today: ***, Urine today: ***   reports that she has been smoking cigarettes. She has been smoking about 0.00 packs per day. She has never used smokeless tobacco. She reports that she does not drink alcohol or use drugs.  Past Medical History:  Diagnosis Date  . Abnormal Pap smear of cervix   . Essential hypertension   . GERD (gastroesophageal reflux disease)   . History of colonic polyps   . History of migraine    Occasional, menustral cycle related  . Hx of chlamydia infection 1989  . Hyperlipidemia     Past Surgical History:  Procedure Laterality Date  . BREAST BIOPSY Right   . BREAST SURGERY  01/2016   Rt.breast bx--?[place]  . COLONOSCOPY WITH PROPOFOL N/A 11/28/2014   Procedure: COLONOSCOPY WITH PROPOFOL;  Surgeon: Cleotis Nipper, MD;  Location: [facility];  Service: Endoscopy;  Laterality: N/A;  . Fibroid resection  01/2012   Dr. Joan Flores  . HYSTEROSCOPY  01/2012   Dr. Joan Flores  . SKIN LESION EXCISION     --actually frozen by dermatology--benign  . TOE SURGERY     Toe nail removed    Current Outpatient Medications  Medication Sig Dispense Refill  . atenolol (TENORMIN) 50 MG tablet  Take 50 mg by mouth daily with lunch.     Marland Kitchen atorvastatin (LIPITOR) 10 MG tablet Take 5 mg by mouth at bedtime.     . cholecalciferol (VITAMIN D) 1000 units tablet Take 2,000 Units by mouth daily.     . Cholecalciferol (VITAMIN D3 PO) Take 4,000 Units by mouth daily.    . Cyanocobalamin (VITAMIN B-12) 2500 MCG SUBL Take 1 tablet by mouth daily.     Marland Kitchen ibuprofen (ADVIL,MOTRIN) 200 MG tablet Take 400 mg by mouth every 6 (six) hours as needed for headache or moderate pain.    Marland Kitchen LORazepam (ATIVAN) 1 MG tablet   0  . Multiple Vitamin (MULTIVITAMIN) capsule Take 1 capsule by mouth daily.    . norethindrone (MICRONOR) 0.35 MG tablet Take 1 tablet by mouth once daily 28 tablet 0  . nystatin-triamcinolone ointment (MYCOLOG) Apply 1 application topically 2 (two) times daily. Use for one week. 30 g 0  . omeprazole (PRILOSEC) 20 MG capsule Take 20 mg by mouth daily with lunch.     . TRANSDERM-SCOP, 1.5 MG, 1 MG/3DAYS   0   No current facility-administered medications for this visit.     Family History  Problem Relation Age of Onset  . Cancer - Colon Father   . Hypertension Father   . Hyperlipidemia Father   . Heart disease Father   . Hyperlipidemia Mother   . Hypertension  Brother   . Heart disease Brother 3       Heart attack  . Cancer - Colon Maternal Grandmother   . Heart disease Paternal Grandfather   . Cancer - Colon Maternal Aunt   . Cancer - Colon Maternal Uncle     Review of Systems  Exam:   There were no vitals taken for this visit.    General appearance: alert, cooperative and appears stated age Head: normocephalic, without obvious abnormality, atraumatic Neck: no adenopathy, supple, symmetrical, trachea midline and thyroid normal to inspection and palpation Lungs: clear to auscultation bilaterally Breasts: normal appearance, no masses or tenderness, No nipple retraction or dimpling, No nipple discharge or bleeding, No axillary adenopathy Heart: regular rate and rhythm Abdomen:  soft, non-tender; no masses, no organomegaly Extremities: extremities normal, atraumatic, no cyanosis or edema Skin: skin color, texture, turgor normal. No rashes or lesions Lymph nodes: cervical, supraclavicular, and axillary nodes normal. Neurologic: grossly normal  Pelvic: External genitalia:  no lesions              No abnormal inguinal nodes palpated.              Urethra:  normal appearing urethra with no masses, tenderness or lesions              Bartholins and Skenes: normal                 Vagina: normal appearing vagina with normal color and discharge, no lesions              Cervix: no lesions              Pap taken: {yes no:314532} Bimanual Exam:  Uterus:  normal size, contour, position, consistency, mobility, non-tender              Adnexa: no mass, fullness, tenderness              Rectal exam: {yes no:314532}.  Confirms.              Anus:  normal sphincter tone, no lesions  Chaperone was present for exam.  Assessment:   Well woman visit with normal exam.   Plan: Mammogram screening discussed. Self breast awareness reviewed. Pap and HR HPV as above. Guidelines for Calcium, Vitamin D, regular exercise program including cardiovascular and weight bearing exercise.   Follow up annually and prn.   Additional counseling given.  {yes [id number]. _______ minutes face to face time of which over 50% was spent in counseling.    After visit summary provided.

## 2019-09-25 ENCOUNTER — Ambulatory Visit: Payer: 59 | Admitting: Obstetrics and Gynecology

## 2019-09-30 ENCOUNTER — Other Ambulatory Visit: Payer: Self-pay | Admitting: Obstetrics and Gynecology

## 2019-09-30 NOTE — Telephone Encounter (Signed)
Medication refill request: Micronor Last AEX:  06/05/2018 BS Next AEX: 10/14/2019 Last MMG (if hormonal medication request): 10/22/2018 BIRADS 1 Negative Density A Refill authorized: Pending #28 with no refills if appropriate. Please advise.

## 2019-10-08 ENCOUNTER — Inpatient Hospital Stay (HOSPITAL_COMMUNITY)
Admission: EM | Disposition: A | Discharge status: Home / Self Care | Payer: Self-pay | Source: Home / Self Care | Attending: Cardiovascular Disease

## 2019-10-08 ENCOUNTER — Ambulatory Visit (HOSPITAL_COMMUNITY): Admit: 2019-10-08 | Payer: 59 | Admitting: Cardiovascular Disease

## 2019-10-08 ENCOUNTER — Inpatient Hospital Stay (HOSPITAL_COMMUNITY)
Admission: EM | Admit: 2019-10-08 | Discharge: 2019-10-10 | DRG: 247 | Disposition: A | Discharge status: Home / Self Care | Payer: 59 | Attending: Cardiovascular Disease | Admitting: Cardiovascular Disease

## 2019-10-08 ENCOUNTER — Encounter (HOSPITAL_COMMUNITY): Payer: Self-pay | Admitting: Cardiovascular Disease

## 2019-10-08 ENCOUNTER — Inpatient Hospital Stay (HOSPITAL_COMMUNITY): Payer: 59

## 2019-10-08 DIAGNOSIS — Z955 Presence of coronary angioplasty implant and graft: Secondary | ICD-10-CM

## 2019-10-08 DIAGNOSIS — F17210 Nicotine dependence, cigarettes, uncomplicated: Secondary | ICD-10-CM | POA: Diagnosis present

## 2019-10-08 DIAGNOSIS — I2102 ST elevation (STEMI) myocardial infarction involving left anterior descending coronary artery: Secondary | ICD-10-CM | POA: Diagnosis present

## 2019-10-08 DIAGNOSIS — E785 Hyperlipidemia, unspecified: Secondary | ICD-10-CM | POA: Diagnosis present

## 2019-10-08 DIAGNOSIS — E782 Mixed hyperlipidemia: Secondary | ICD-10-CM | POA: Diagnosis not present

## 2019-10-08 DIAGNOSIS — I10 Essential (primary) hypertension: Secondary | ICD-10-CM

## 2019-10-08 DIAGNOSIS — Z880 Allergy status to penicillin: Secondary | ICD-10-CM | POA: Diagnosis not present

## 2019-10-08 DIAGNOSIS — Z8249 Family history of ischemic heart disease and other diseases of the circulatory system: Secondary | ICD-10-CM | POA: Diagnosis not present

## 2019-10-08 DIAGNOSIS — I2109 ST elevation (STEMI) myocardial infarction involving other coronary artery of anterior wall: Secondary | ICD-10-CM

## 2019-10-08 DIAGNOSIS — I2510 Atherosclerotic heart disease of native coronary artery without angina pectoris: Secondary | ICD-10-CM | POA: Diagnosis present

## 2019-10-08 DIAGNOSIS — Z20828 Contact with and (suspected) exposure to other viral communicable diseases: Secondary | ICD-10-CM | POA: Diagnosis present

## 2019-10-08 DIAGNOSIS — K219 Gastro-esophageal reflux disease without esophagitis: Secondary | ICD-10-CM | POA: Diagnosis present

## 2019-10-08 HISTORY — PX: LEFT HEART CATH AND CORONARY ANGIOGRAPHY: CATH118249

## 2019-10-08 HISTORY — PX: CORONARY/GRAFT ACUTE MI REVASCULARIZATION: CATH118305

## 2019-10-08 HISTORY — DX: ST elevation (STEMI) myocardial infarction of unspecified site: I21.3

## 2019-10-08 LAB — PROTIME-INR
INR: 2 — ABNORMAL HIGH (ref 0.8–1.2)
Prothrombin Time: 22.4 seconds — ABNORMAL HIGH (ref 11.4–15.2)

## 2019-10-08 LAB — LIPID PANEL
Cholesterol: 134 mg/dL (ref 0–200)
HDL: 37 mg/dL — ABNORMAL LOW (ref >40)
LDL Cholesterol: 79 mg/dL (ref 0–99)
Total CHOL/HDL Ratio: 3.6 RATIO
Triglycerides: 90 mg/dL (ref <150)
VLDL: 18 mg/dL (ref 0–40)

## 2019-10-08 LAB — CBC
HCT: 42.1 % (ref 36.0–46.0)
Hemoglobin: 14.3 g/dL (ref 12.0–15.0)
MCH: 28.8 pg (ref 26.0–34.0)
MCHC: 34 g/dL (ref 30.0–36.0)
MCV: 84.9 fL (ref 80.0–100.0)
Platelets: 829 10*3/uL — ABNORMAL HIGH (ref 150–400)
RBC: 4.96 MIL/uL (ref 3.87–5.11)
RDW: 14.9 % (ref 11.5–15.5)
WBC: 21 10*3/uL — ABNORMAL HIGH (ref 4.0–10.5)
nRBC: 0 % (ref 0.0–0.2)

## 2019-10-08 LAB — BASIC METABOLIC PANEL
Anion gap: 10 (ref 5–15)
BUN: 8 mg/dL (ref 6–20)
CO2: 24 mmol/L (ref 22–32)
Calcium: 8.9 mg/dL (ref 8.9–10.3)
Chloride: 103 mmol/L (ref 98–111)
Creatinine, Ser: 0.72 mg/dL (ref 0.44–1.00)
GFR calc Af Amer: >60 mL/min (ref >60)
GFR calc non Af Amer: >60 mL/min (ref >60)
Glucose, Bld: 98 mg/dL (ref 70–99)
Potassium: 3.6 mmol/L (ref 3.5–5.1)
Sodium: 137 mmol/L (ref 135–145)

## 2019-10-08 LAB — COMPREHENSIVE METABOLIC PANEL
ALT: 23 U/L (ref 0–44)
AST: 24 U/L (ref 15–41)
Albumin: 3.3 g/dL — ABNORMAL LOW (ref 3.5–5.0)
Alkaline Phosphatase: 64 U/L (ref 38–126)
Anion gap: 17 — ABNORMAL HIGH (ref 5–15)
BUN: 9 mg/dL (ref 6–20)
CO2: 14 mmol/L — ABNORMAL LOW (ref 22–32)
Calcium: 8 mg/dL — ABNORMAL LOW (ref 8.9–10.3)
Chloride: 90 mmol/L — ABNORMAL LOW (ref 98–111)
Creatinine, Ser: 0.8 mg/dL (ref 0.44–1.00)
GFR calc Af Amer: >60 mL/min (ref >60)
GFR calc non Af Amer: >60 mL/min (ref >60)
Glucose, Bld: 169 mg/dL — ABNORMAL HIGH (ref 70–99)
Potassium: 2.9 mmol/L — ABNORMAL LOW (ref 3.5–5.1)
Sodium: 121 mmol/L — ABNORMAL LOW (ref 135–145)
Total Bilirubin: 0.7 mg/dL (ref 0.3–1.2)
Total Protein: 5.8 g/dL — ABNORMAL LOW (ref 6.5–8.1)

## 2019-10-08 LAB — POCT I-STAT, CHEM 8
BUN: 12 mg/dL (ref 6–20)
Calcium, Ion: 1.09 mmol/L — ABNORMAL LOW (ref 1.15–1.40)
Chloride: 90 mmol/L — ABNORMAL LOW (ref 98–111)
Creatinine, Ser: 0.5 mg/dL (ref 0.44–1.00)
Glucose, Bld: 153 mg/dL — ABNORMAL HIGH (ref 70–99)
HCT: 43 % (ref 36.0–46.0)
Hemoglobin: 14.6 g/dL (ref 12.0–15.0)
Potassium: 3 mmol/L — ABNORMAL LOW (ref 3.5–5.1)
Sodium: 124 mmol/L — ABNORMAL LOW (ref 135–145)
TCO2: 16 mmol/L — ABNORMAL LOW (ref 22–32)

## 2019-10-08 LAB — SARS CORONAVIRUS 2 BY RT PCR (HOSPITAL ORDER, PERFORMED IN ~~LOC~~ HOSPITAL LAB): SARS Coronavirus 2: NEGATIVE

## 2019-10-08 LAB — ECHOCARDIOGRAM COMPLETE: Weight: 3481.5 oz

## 2019-10-08 LAB — TROPONIN I (HIGH SENSITIVITY)
Troponin I (High Sensitivity): 41 ng/L — ABNORMAL HIGH (ref <18)
Troponin I (High Sensitivity): 595 ng/L (ref <18)
Troponin I (High Sensitivity): 616 ng/L (ref <18)
Troponin I (High Sensitivity): 671 ng/L (ref <18)

## 2019-10-08 LAB — APTT: aPTT: >200 seconds (ref 24–36)

## 2019-10-08 LAB — HEMOGLOBIN A1C
Hgb A1c MFr Bld: 5.5 % (ref 4.8–5.6)
Mean Plasma Glucose: 111.15 mg/dL

## 2019-10-08 LAB — POCT ACTIVATED CLOTTING TIME: Activated Clotting Time: 676 seconds

## 2019-10-08 LAB — MRSA PCR SCREENING: MRSA by PCR: NEGATIVE

## 2019-10-08 SURGERY — LEFT HEART CATH AND CORONARY ANGIOGRAPHY
Anesthesia: LOCAL

## 2019-10-08 MED ORDER — ONDANSETRON HCL 4 MG/2ML IJ SOLN: 4.0000 mg | Freq: Four times a day (QID) | INTRAMUSCULAR | Status: DC | PRN

## 2019-10-08 MED ORDER — OXYCODONE HCL 5 MG PO TABS: 5.0000 mg | ORAL_TABLET | ORAL | Status: DC | PRN

## 2019-10-08 MED ORDER — LABETALOL HCL 5 MG/ML IV SOLN: 10.0000 mg | INTRAVENOUS | Status: AC | PRN

## 2019-10-08 MED ORDER — LIDOCAINE HCL (PF) 1 % IJ SOLN
INTRAMUSCULAR | Status: AC
  Filled 2019-10-08: qty 30

## 2019-10-08 MED ORDER — SODIUM CHLORIDE 0.9 % IV SOLN: INTRAVENOUS | Status: AC

## 2019-10-08 MED ORDER — VERAPAMIL HCL 2.5 MG/ML IV SOLN
INTRAVENOUS | Status: DC | PRN
  Administered 2019-10-08: 11:00:00 10 mL via INTRA_ARTERIAL

## 2019-10-08 MED ORDER — MIDAZOLAM HCL 2 MG/2ML IJ SOLN
INTRAMUSCULAR | Status: AC
  Filled 2019-10-08: qty 2

## 2019-10-08 MED ORDER — LIDOCAINE HCL (PF) 1 % IJ SOLN
INTRAMUSCULAR | Status: DC | PRN
  Administered 2019-10-08: 2 mL

## 2019-10-08 MED ORDER — SODIUM CHLORIDE 0.9 % IV SOLN: 250.0000 mL | INTRAVENOUS | Status: DC | PRN

## 2019-10-08 MED ORDER — ATENOLOL 50 MG PO TABS
50.0000 mg | ORAL_TABLET | ORAL | Status: AC
  Administered 2019-10-08: 15:00:00 50 mg via ORAL
  Filled 2019-10-08: qty 2
  Filled 2019-10-08: qty 1

## 2019-10-08 MED ORDER — ACETAMINOPHEN 325 MG PO TABS: 650.0000 mg | ORAL_TABLET | ORAL | Status: DC | PRN

## 2019-10-08 MED ORDER — NITROGLYCERIN 1 MG/10 ML FOR IR/CATH LAB
INTRA_ARTERIAL | Status: AC
  Filled 2019-10-08: qty 10

## 2019-10-08 MED ORDER — HEPARIN (PORCINE) IN NACL 1000-0.9 UT/500ML-% IV SOLN
INTRAVENOUS | Status: DC | PRN
  Administered 2019-10-08 (×2): 500 mL

## 2019-10-08 MED ORDER — TICAGRELOR 90 MG PO TABS
ORAL_TABLET | ORAL | Status: AC
  Filled 2019-10-08: qty 1

## 2019-10-08 MED ORDER — AMIODARONE HCL 150 MG/3ML IV SOLN
INTRAVENOUS | Status: AC
  Filled 2019-10-08: qty 3

## 2019-10-08 MED ORDER — AMIODARONE LOAD VIA INFUSION
INTRAVENOUS | Status: DC | PRN
  Administered 2019-10-08: 11:00:00 150 mg via INTRAVENOUS

## 2019-10-08 MED ORDER — TIROFIBAN HCL IN NACL 5-0.9 MG/100ML-% IV SOLN
INTRAVENOUS | Status: AC
  Filled 2019-10-08: qty 100

## 2019-10-08 MED ORDER — FENTANYL CITRATE (PF) 100 MCG/2ML IJ SOLN
INTRAMUSCULAR | Status: AC
  Filled 2019-10-08: qty 2

## 2019-10-08 MED ORDER — TICAGRELOR 90 MG PO TABS
90.0000 mg | ORAL_TABLET | Freq: Two times a day (BID) | ORAL | Status: DC
  Administered 2019-10-08 – 2019-10-10 (×4): 90 mg via ORAL
  Filled 2019-10-08 (×4): qty 1

## 2019-10-08 MED ORDER — TIROFIBAN (AGGRASTAT) BOLUS VIA INFUSION
INTRAVENOUS | Status: DC | PRN
  Administered 2019-10-08: 11:00:00 2500 ug via INTRAVENOUS

## 2019-10-08 MED ORDER — MORPHINE SULFATE (PF) 2 MG/ML IV SOLN: 2.0000 mg | INTRAVENOUS | Status: DC | PRN

## 2019-10-08 MED ORDER — HYDRALAZINE HCL 20 MG/ML IJ SOLN
10.0000 mg | INTRAMUSCULAR | Status: AC | PRN
  Administered 2019-10-08 (×2): 10 mg via INTRAVENOUS
  Filled 2019-10-08: qty 1

## 2019-10-08 MED ORDER — ATENOLOL 50 MG PO TABS
50.0000 mg | ORAL_TABLET | Freq: Every day | ORAL | Status: DC
  Administered 2019-10-09 – 2019-10-10 (×2): 50 mg via ORAL
  Filled 2019-10-08: qty 1
  Filled 2019-10-08: qty 2

## 2019-10-08 MED ORDER — MIDAZOLAM HCL 2 MG/2ML IJ SOLN
INTRAMUSCULAR | Status: DC | PRN
  Administered 2019-10-08: 2 mg via INTRAVENOUS

## 2019-10-08 MED ORDER — HEPARIN (PORCINE) IN NACL 1000-0.9 UT/500ML-% IV SOLN
INTRAVENOUS | Status: AC
  Filled 2019-10-08: qty 1000

## 2019-10-08 MED ORDER — TIROFIBAN HCL IN NACL 5-0.9 MG/100ML-% IV SOLN
0.1500 ug/kg/min | INTRAVENOUS | Status: AC
  Administered 2019-10-08 – 2019-10-09 (×3): 0.15 ug/kg/min via INTRAVENOUS
  Filled 2019-10-08 (×3): qty 100

## 2019-10-08 MED ORDER — ATORVASTATIN CALCIUM 80 MG PO TABS
80.0000 mg | ORAL_TABLET | Freq: Every day | ORAL | Status: DC
  Administered 2019-10-08 – 2019-10-09 (×2): 80 mg via ORAL
  Filled 2019-10-08 (×2): qty 1

## 2019-10-08 MED ORDER — HEPARIN SODIUM (PORCINE) 1000 UNIT/ML IJ SOLN
INTRAMUSCULAR | Status: DC | PRN
  Administered 2019-10-08: 10000 [IU] via INTRAVENOUS

## 2019-10-08 MED ORDER — HYDRALAZINE HCL 20 MG/ML IJ SOLN
INTRAMUSCULAR | Status: AC
  Filled 2019-10-08: qty 1

## 2019-10-08 MED ORDER — TIROFIBAN HCL IN NACL 5-0.9 MG/100ML-% IV SOLN
INTRAVENOUS | Status: AC | PRN
  Administered 2019-10-08: 0.15 ug/kg/min via INTRAVENOUS

## 2019-10-08 MED ORDER — TICAGRELOR 90 MG PO TABS
ORAL_TABLET | ORAL | Status: DC | PRN
  Administered 2019-10-08: 180 mg via ORAL

## 2019-10-08 MED ORDER — FENTANYL CITRATE (PF) 100 MCG/2ML IJ SOLN
INTRAMUSCULAR | Status: DC | PRN
  Administered 2019-10-08: 50 ug via INTRAVENOUS

## 2019-10-08 MED ORDER — SODIUM CHLORIDE 0.9% FLUSH: 3.0000 mL | INTRAVENOUS | Status: DC | PRN

## 2019-10-08 MED ORDER — TIROFIBAN HCL IN NACL 5-0.9 MG/100ML-% IV SOLN
0.1500 ug/kg/min | INTRAVENOUS | Status: DC
  Administered 2019-10-08 (×2): 0.15 ug/kg/min via INTRAVENOUS

## 2019-10-08 MED ORDER — IOHEXOL 350 MG/ML SOLN
INTRAVENOUS | Status: DC | PRN
  Administered 2019-10-08: 12:00:00 125 mL via INTRACARDIAC

## 2019-10-08 MED ORDER — DIAZEPAM 5 MG PO TABS: 5.0000 mg | ORAL_TABLET | Freq: Four times a day (QID) | ORAL | Status: DC | PRN

## 2019-10-08 MED ORDER — ASPIRIN 81 MG PO CHEW
81.0000 mg | CHEWABLE_TABLET | Freq: Every day | ORAL | Status: DC
  Administered 2019-10-09 – 2019-10-10 (×2): 81 mg via ORAL
  Filled 2019-10-08 (×2): qty 1

## 2019-10-08 MED ORDER — VERAPAMIL HCL 2.5 MG/ML IV SOLN
INTRAVENOUS | Status: AC
  Filled 2019-10-08: qty 2

## 2019-10-08 MED ORDER — HEPARIN SODIUM (PORCINE) 1000 UNIT/ML IJ SOLN
INTRAMUSCULAR | Status: AC
  Filled 2019-10-08: qty 1

## 2019-10-08 MED ORDER — CHLORHEXIDINE GLUCONATE CLOTH 2 % EX PADS
6.0000 | MEDICATED_PAD | Freq: Every day | CUTANEOUS | Status: DC
  Administered 2019-10-08 – 2019-10-09 (×2): 6 via TOPICAL

## 2019-10-08 MED ORDER — HEPARIN (PORCINE) IN NACL 1000-0.9 UT/500ML-% IV SOLN
INTRAVENOUS | Status: AC
  Filled 2019-10-08: qty 500

## 2019-10-08 MED ORDER — SODIUM CHLORIDE 0.9% FLUSH
3.0000 mL | Freq: Two times a day (BID) | INTRAVENOUS | Status: DC
  Administered 2019-10-08 – 2019-10-09 (×3): 3 mL via INTRAVENOUS

## 2019-10-08 SURGICAL SUPPLY — 17 items
BALLN SAPPHIRE 2.5X15 (BALLOONS) ×2
BALLN SAPPHIRE ~~LOC~~ 3.75X10 (BALLOONS) ×1 IMPLANT
BALLOON SAPPHIRE 2.5X15 (BALLOONS) IMPLANT
CATH 5FR JL3.5 JR4 ANG PIG MP (CATHETERS) ×1 IMPLANT
CATH VISTA GUIDE 6FR XBLAD3.5 (CATHETERS) ×1 IMPLANT
DEVICE RAD COMP TR BAND LRG (VASCULAR PRODUCTS) ×1 IMPLANT
GLIDESHEATH SLEND SS 6F .021 (SHEATH) ×1 IMPLANT
GUIDEWIRE INQWIRE 1.5J.035X260 (WIRE) IMPLANT
INQWIRE 1.5J .035X260CM (WIRE) ×2
KIT ENCORE 26 ADVANTAGE (KITS) ×1 IMPLANT
KIT HEART LEFT (KITS) ×2 IMPLANT
PACK CARDIAC CATHETERIZATION (CUSTOM PROCEDURE TRAY) ×2 IMPLANT
STENT SYNERGY DES 3.5X20 (Permanent Stent) ×1 IMPLANT
SYR MEDRAD MARK 7 150ML (SYRINGE) ×2 IMPLANT
TRANSDUCER W/STOPCOCK (MISCELLANEOUS) ×2 IMPLANT
TUBING CIL FLEX 10 FLL-RA (TUBING) ×2 IMPLANT
WIRE COUGAR XT STRL 190CM (WIRE) ×1 IMPLANT

## 2019-10-08 NOTE — Progress Notes (Signed)
  Echocardiogram 2D Echocardiogram has been performed.  Carmen Edwards 10/08/2019, 3:51 PM

## 2019-10-08 NOTE — Care Management (Signed)
PER BRYANT W/[facility] for Brilinta  Bid for 30 day supply is $45.00.  NO PA required [facility],

## 2019-10-08 NOTE — H&P (Signed)
Cardiology Admission History and Physical:   Patient ID: Carmen Edwards MRN: [id number]; DOB: Jun 05, 1966   Admission date: 10/08/2019  Primary Care Provider: London Pepper, MD Primary Cardiologist: Domenic Polite Primary Electrophysiologist:  None   Chief Complaint:  Chest pain  Patient Profile:   Carmen Edwards is a 53 y.o. female with h/o GERD, HTN, HLD, former tobacco use with chest pain this am. EMS activated a code STEMI. EKG with anterior ST elevation. Pt with ongoing chest pain upon arrival to [facility]. Pt diaphoretic.   History of Present Illness:   Carmen Edwards is a 53 y.o. female with h/o GERD, HTN, HLD, former tobacco use with chest pain this am. EMS activated a code STEMI. EKG with anterior ST elevation. Pt with ongoing chest pain upon arrival to [facility]. Pt diaphoretic. Pt taken to cath lab for emergent cardiac cath.   Past Medical History:  Diagnosis Date  . Abnormal Pap smear of cervix   . Essential hypertension   . GERD (gastroesophageal reflux disease)   . History of colonic polyps   . History of migraine    Occasional, menustral cycle related  . Hx of chlamydia infection 1989  . Hyperlipidemia     Past Surgical History:  Procedure Laterality Date  . BREAST BIOPSY Right   . BREAST SURGERY  01/2016   Rt.breast bx--?[place]  . COLONOSCOPY WITH PROPOFOL N/A 11/28/2014   Procedure: COLONOSCOPY WITH PROPOFOL;  Surgeon: Cleotis Nipper, MD;  Location: [facility] ENDOSCOPY;  Service: Endoscopy;  Laterality: N/A;  . Fibroid resection  01/2012   Dr. Joan Flores  . HYSTEROSCOPY  01/2012   Dr. Joan Flores  . SKIN LESION EXCISION     --actually frozen by dermatology--benign  . TOE SURGERY     Toe nail removed     Medications Prior to Admission: Prior to Admission medications   Medication Sig Start Date End Date Taking? Authorizing Provider  atenolol (TENORMIN) 50 MG tablet Take 50 mg by mouth daily with lunch.     [provider]  atorvastatin (LIPITOR) 10 MG tablet Take 5 mg by  mouth at bedtime.     [provider]  cholecalciferol (VITAMIN D) 1000 units tablet Take 2,000 Units by mouth daily.     [provider]  Cholecalciferol (VITAMIN D3 PO) Take 4,000 Units by mouth daily.    [provider]  Cyanocobalamin (VITAMIN B-12) 2500 MCG SUBL Take 1 tablet by mouth daily.     [provider]  ibuprofen (ADVIL,MOTRIN) 200 MG tablet Take 400 mg by mouth every 6 (six) hours as needed for headache or moderate pain.    [provider]  LORazepam (ATIVAN) 1 MG tablet  05/04/18   [provider]  Multiple Vitamin (MULTIVITAMIN) capsule Take 1 capsule by mouth daily.    [provider]  norethindrone (MICRONOR) 0.35 MG tablet Take 1 tablet by mouth once daily 09/30/19   Nunzio Cobbs, MD  nystatin-triamcinolone ointment [facility]) Apply 1 application topically 2 (two) times daily. Use for one week. 06/05/18   Nunzio Cobbs, MD  omeprazole (PRILOSEC) 20 MG capsule Take 20 mg by mouth daily with lunch.     [provider]  TRANSDERM-SCOP, 1.5 MG, 1 MG/3DAYS  05/04/18   [provider]     Allergies:    Allergies  Allergen Reactions  . Amoxicillin Nausea And Vomiting    (high doses)    Social History:   Social History   Socioeconomic History  .  Marital status: Married    Spouse name: Not on file  . Number of children: Not on file  . Years of education: Not on file  . Highest education level: Not on file  Occupational History  . Not on file  Social Needs  . Financial resource strain: Not on file  . Food insecurity    Worry: Not on file    Inability: Not on file  . Transportation needs    Medical: Not on file    Non-medical: Not on file  Tobacco Use  . Smoking status: Light Tobacco Smoker    Packs/day: 0.00    Types: Cigarettes  . Smokeless tobacco: Never Used  . Tobacco comment: smokes 1/2 pack per year  Substance and Sexual Activity  . Alcohol use: No     Alcohol/week: 0.0 standard drinks  . Drug use: No  . Sexual activity: Yes    Partners: Male    Birth control/protection: Pill    Comment: Micronor  Lifestyle  . Physical activity    Days per week: Not on file    Minutes per session: Not on file  . Stress: Not on file  Relationships  . Social Herbalist on phone: Not on file    Gets together: Not on file    Attends religious service: Not on file    Active member of club or organization: Not on file    Attends meetings of clubs or organizations: Not on file    Relationship status: Not on file  . Intimate partner violence    Fear of current or ex partner: Not on file    Emotionally abused: Not on file    Physically abused: Not on file    Forced sexual activity: Not on file  Other Topics Concern  . Not on file  Social History Narrative  . Not on file    Family History:   The patient's family history includes Cancer - Colon in her father, maternal aunt, maternal grandmother, and maternal uncle; Heart disease in her father and paternal grandfather; Heart disease (age of onset: 66) in her brother; Hyperlipidemia in her father and mother; Hypertension in her brother and father.    ROS:  Please see the history of present illness.  All other ROS reviewed and negative.     Physical Exam/Data:  There were no vitals filed for this visit. No intake or output data in the 24 hours ending 10/08/19 1053 Last 3 Weights 06/05/2018 05/19/2017 09/23/2016  Weight (lbs) 232 lb 225 lb 221 lb  Weight (kg) 105.235 kg 102.059 kg 100.245 kg     There is no height or weight on file to calculate BMI.  General:  Well nourished, well developed, ill appearing, diaphoretic HEENT: normal Lymph: no adenopathy Neck: no JVD Endocrine:  No thryomegaly Vascular: No carotid bruits; FA pulses 2+ bilaterally without bruits  Cardiac:  normal S1, S2; RRR; no murmur  Lungs:  clear to auscultation bilaterally, no wheezing, rhonchi or rales  Abd: soft,  nontender, no hepatomegaly  Ext: no LE  edema Musculoskeletal:  No deformities, BUE and BLE strength normal and equal Skin: warm and dry  Neuro:  CNs 2-12 intact, no focal abnormalities noted Psych:  Normal affect    EKG:  The ECG that was done was personally reviewed and demonstrates sinus with 2 mm ST elevation V2, St depression with TWI inferior leads.   Relevant CV Studies:   Laboratory Data:  High Sensitivity Troponin:  No  results for input(s): TROPONINIHS in the last 720 hours.    ChemistryNo results for input(s): NA, K, CL, CO2, GLUCOSE, BUN, CREATININE, CALCIUM, GFRNONAA, GFRAA, ANIONGAP in the last 168 hours.  No results for input(s): PROT, ALBUMIN, AST, ALT, ALKPHOS, BILITOT in the last 168 hours. HematologyNo results for input(s): WBC, RBC, HGB, HCT, MCV, MCH, MCHC, RDW, PLT in the last 168 hours. BNPNo results for input(s): BNP, PROBNP in the last 168 hours.  DDimer No results for input(s): DDIMER in the last 168 hours.   Radiology/Studies:  No results found.  Assessment and Plan:   1. Acute anterior STEMI: Plan emergent cardiac cath with PCI.   Severity of Illness: The appropriate patient status for this patient is INPATIENT. Inpatient status is judged to be reasonable and necessary in order to provide the required intensity of service to ensure the patient's safety. The patient's presenting symptoms, physical exam findings, and initial radiographic and laboratory data in the context of their chronic comorbidities is felt to place them at high risk for further clinical deterioration. Furthermore, it is not anticipated that the patient will be medically stable for discharge from the hospital within 2 midnights of admission. The following factors support the patient status of inpatient.   " The patient's presenting symptoms include anterior stemi. "   * I certify that at the point of admission it is my clinical judgment that the patient will require inpatient hospital  care spanning beyond 2 midnights from the point of admission due to high intensity of service, high risk for further deterioration and high frequency of surveillance required.*    For questions or updates, please contact [place] Please consult [email or web address] for contact info under        Signed, Lauree Chandler, MD  10/08/2019 10:53 AM

## 2019-10-08 NOTE — Care Management (Signed)
10-08-19 Benefits check submitted for Brilinta. CM will make patient aware of cost once completed. CM will also provide Brilinta co pay card once ready to transition home. No further needs at this time. Bethena Roys, RN, BSN Case Manager [phone]

## 2019-10-09 DIAGNOSIS — E782 Mixed hyperlipidemia: Secondary | ICD-10-CM

## 2019-10-09 LAB — HEPATIC FUNCTION PANEL
ALT: 23 U/L (ref 0–44)
AST: 38 U/L (ref 15–41)
Albumin: 3.4 g/dL — ABNORMAL LOW (ref 3.5–5.0)
Alkaline Phosphatase: 75 U/L (ref 38–126)
Bilirubin, Direct: 0.4 mg/dL — ABNORMAL HIGH (ref 0.0–0.2)
Indirect Bilirubin: 1.2 mg/dL — ABNORMAL HIGH (ref 0.3–0.9)
Total Bilirubin: 1.6 mg/dL — ABNORMAL HIGH (ref 0.3–1.2)
Total Protein: 6.1 g/dL — ABNORMAL LOW (ref 6.5–8.1)

## 2019-10-09 LAB — BASIC METABOLIC PANEL
Anion gap: 9 (ref 5–15)
BUN: 7 mg/dL (ref 6–20)
CO2: 19 mmol/L — ABNORMAL LOW (ref 22–32)
Calcium: 8.6 mg/dL — ABNORMAL LOW (ref 8.9–10.3)
Chloride: 108 mmol/L (ref 98–111)
Creatinine, Ser: 0.56 mg/dL (ref 0.44–1.00)
GFR calc Af Amer: >60 mL/min (ref >60)
GFR calc non Af Amer: >60 mL/min (ref >60)
Glucose, Bld: 82 mg/dL (ref 70–99)
Potassium: 4.1 mmol/L (ref 3.5–5.1)
Sodium: 136 mmol/L (ref 135–145)

## 2019-10-09 LAB — CBC
HCT: 46.7 % — ABNORMAL HIGH (ref 36.0–46.0)
Hemoglobin: 15.5 g/dL — ABNORMAL HIGH (ref 12.0–15.0)
MCH: 28.5 pg (ref 26.0–34.0)
MCHC: 33.2 g/dL (ref 30.0–36.0)
MCV: 86 fL (ref 80.0–100.0)
Platelets: 651 10*3/uL — ABNORMAL HIGH (ref 150–400)
RBC: 5.43 MIL/uL — ABNORMAL HIGH (ref 3.87–5.11)
RDW: 15 % (ref 11.5–15.5)
WBC: 21.7 10*3/uL — ABNORMAL HIGH (ref 4.0–10.5)
nRBC: 0 % (ref 0.0–0.2)

## 2019-10-09 MED FILL — Amiodarone HCl Inj 150 MG/3ML (50 MG/ML): INTRAVENOUS | Qty: 3 | Status: AC

## 2019-10-09 MED FILL — Nitroglycerin IV Soln 100 MCG/ML in D5W: INTRA_ARTERIAL | Qty: 10 | Status: AC

## 2019-10-09 MED FILL — Heparin Sod (Porcine)-NaCl IV Soln 1000 Unit/500ML-0.9%: INTRAVENOUS | Qty: 500 | Status: AC

## 2019-10-09 MED FILL — Heparin Sod (Porcine)-NaCl IV Soln 1000 Unit/500ML-0.9%: INTRAVENOUS | Qty: 1000 | Status: AC

## 2019-10-09 NOTE — Progress Notes (Addendum)
Progress Note  Patient Name: Carmen Edwards Date of Encounter: 10/09/2019  Primary Cardiologist: No primary care provider on file.   Subjective   She is feeling better this morning and denies chest pain or shortness of breath.  Inpatient Medications    Scheduled Meds: . aspirin  81 mg Oral Daily  . atenolol  50 mg Oral Daily  . atorvastatin  80 mg Oral QHS  . Chlorhexidine Gluconate Cloth  6 each Topical Daily  . sodium chloride flush  3 mL Intravenous Q12H  . ticagrelor  90 mg Oral BID   Continuous Infusions: . sodium chloride    . tirofiban 0.15 mcg/kg/min (10/09/19 0525)   PRN Meds: sodium chloride, acetaminophen, diazepam, morphine injection, ondansetron (ZOFRAN) IV, oxyCODONE, sodium chloride flush   Vital Signs    Vitals:   10/09/19 0200 10/09/19 0300 10/09/19 0400 10/09/19 0500  BP: 134/79 (!) 150/71 120/61 124/64  Pulse: (!) 58 (!) 54 (!) 58 (!) 57  Resp: 12 14 14 14   Temp:      TempSrc:      SpO2: 98% 98% 98% 98%  Weight:        Intake/Output Summary (Last 24 hours) at 10/09/2019 0644 Last data filed at 10/09/2019 0500 Gross per 24 hour  Intake 864.16 ml  Output -  Net 864.16 ml   Filed Weights   10/08/19 1400  Weight: 98.7 kg    Telemetry    Unremarkable- Personally Reviewed  ECG    T wave inversion in anterior leads- Personally Reviewed  Physical Exam   GEN: No acute distress.   Cardiac: RRR, no murmurs, rubs, or gallops.  Respiratory: Clear to auscultation bilaterally. GI: Soft, nontender, non-distended  MS: No edema; No deformity. Neuro:  Nonfocal  Psych: Normal affect   Labs    Chemistry Recent Labs  Lab 10/08/19 1117 10/08/19 1658 10/09/19 0421  NA 121* 137 136  K 2.9* 3.6 4.1  CL 90* 103 108  CO2 14* 24 19*  GLUCOSE 169* 98 82  BUN 9 8 7   CREATININE 0.80 0.72 0.56  CALCIUM 8.0* 8.9 8.6*  PROT 5.8*  --  6.1*  ALBUMIN 3.3*  --  3.4*  AST 24  --  38  ALT 23  --  23  ALKPHOS 64  --  75  BILITOT 0.7  --   1.6*  GFRNONAA >60 >60 >60  GFRAA >60 >60 >60  ANIONGAP 17* 10 9     Hematology Recent Labs  Lab 10/08/19 1112 10/08/19 1117 10/09/19 0421  WBC  --  21.0* 21.7*  RBC  --  4.96 5.43*  HGB 14.6 14.3 15.5*  HCT 43.0 42.1 46.7*  MCV  --  84.9 86.0  MCH  --  28.8 28.5  MCHC  --  34.0 33.2  RDW  --  14.9 15.0  PLT  --  829* 651*    Cardiac EnzymesNo results for input(s): TROPONINI in the last 168 hours. No results for input(s): TROPIPOC in the last 168 hours.   BNPNo results for input(s): BNP, PROBNP in the last 168 hours.   DDimer No results for input(s): DDIMER in the last 168 hours.   Radiology    No results found.  Cardiac Studies   10/08/2019- left heart cath   Prox LAD lesion is 100% stenosed.  A drug-eluting stent was successfully placed using a STENT SYNERGY DES 3.5X20.  Post intervention, there is a 0% residual stenosis.  The left ventricular systolic function is  normal.  LV end diastolic pressure is normal.  The left ventricular ejection fraction is 35-45% by visual estimate.  There is no mitral valve regurgitation.   1. Acute anterior ST elevation MI secondary to thrombotic occlusion of the proximal LAD 2. Successful PTCA/DES x 1 proximal LAD 3. No obstructive disease in the Circumflex or RCA 4. Mild segmental LV systolic dysfunction with hypokinesis of the anterior wall and apex. LVEF around 40% by visual estimate.   10/08/2019-echocardiogram IMPRESSIONS   1. Left ventricular ejection fraction, by visual estimation, is 60 to 65%. The left ventricle has normal function. Normal left ventricular size. There is mildly increased left ventricular hypertrophy.  2. Global right ventricle has normal systolic function.The right ventricular size is normal. No increase in right ventricular wall thickness.  3. Left atrial size was normal.  4. Right atrial size was normal.  5. The mitral valve is normal in structure. No evidence of mitral valve regurgitation.   6. The tricuspid valve is normal in structure. Tricuspid valve regurgitation was not visualized by color flow Doppler.  7. The aortic valve is tricuspid Aortic valve regurgitation was not visualized by color flow Doppler.  Patient Profile     53 y.o. female with medical history significant for GERD, hypertension, hyperlipidemia, prior tobacco use who presented with chest pain found to have 100% proximal LAD stenosis status post DES.  Assessment & Plan    #Acute STEMI Doing well post intervention and denies chest pain or shortness of breath.  Echocardiogram did not reveal reduced ejection fraction and BP has been stable and will thus not initiate ACE inhibitor or ARB at this point.  Can definitely consider for the future.  She stable to transfer to cardiac telemetry today -Continue aspirin, Brilinta  #Hypertension -Continue atenolol  #Hyperlipidemia -Continue Lipitor; LDL 79  #Leukocytosis Most likely reactive from acute myocardial infarction.  We will continue to monitor  For questions or updates, please contact [place] Please consult [email or web address] for contact info under Cardiology/STEMI.      Signed, Jean Rosenthal, MD  10/09/2019, 6:44 AM    I have examined the patient and reviewed assessment and plan and discussed with patient.  Agree with above as stated.    Stressed importance of DAPT and other secondary prevention. LDL target < 70.  Will move to tele. Possible d/c in AM.  Larae Grooms

## 2019-10-09 NOTE — Progress Notes (Signed)
CARDIAC REHAB PHASE I   PRE:  Rate/Rhythm: 68 SR    BP: sitting 136/70    SaO2: 99 RA  MODE:  Ambulation: 370 ft   POST:  Rate/Rhythm: 80 SR    BP: sitting 139/77     SaO2: 100 RA  Tolerated well. Sts she is not having any sx that she has had for 4 months (back pain, arm sensation). Ed completed with good reception. Understands importance of Brilinta, MI, stent, restrictions, diet, exercise, NTG, and CRPII. Will refer to [place]. Encouraged more walking. [place], ACSM 10/09/2019 11:25 AM

## 2019-10-09 NOTE — Plan of Care (Signed)
  Problem: Education: Goal: Knowledge of General Education information will improve Description Including pain rating scale, medication(s)/side effects and non-pharmacologic comfort measures Outcome: Progressing   

## 2019-10-10 ENCOUNTER — Encounter (HOSPITAL_COMMUNITY): Payer: Self-pay | Admitting: Cardiology

## 2019-10-10 ENCOUNTER — Telehealth: Payer: Self-pay | Admitting: Obstetrics and Gynecology

## 2019-10-10 DIAGNOSIS — I10 Essential (primary) hypertension: Secondary | ICD-10-CM

## 2019-10-10 DIAGNOSIS — E785 Hyperlipidemia, unspecified: Secondary | ICD-10-CM

## 2019-10-10 LAB — BASIC METABOLIC PANEL
Anion gap: 10 (ref 5–15)
BUN: 12 mg/dL (ref 6–20)
CO2: 21 mmol/L — ABNORMAL LOW (ref 22–32)
Calcium: 8.6 mg/dL — ABNORMAL LOW (ref 8.9–10.3)
Chloride: 106 mmol/L (ref 98–111)
Creatinine, Ser: 0.74 mg/dL (ref 0.44–1.00)
GFR calc Af Amer: >60 mL/min (ref >60)
GFR calc non Af Amer: >60 mL/min (ref >60)
Glucose, Bld: 82 mg/dL (ref 70–99)
Potassium: 3 mmol/L — ABNORMAL LOW (ref 3.5–5.1)
Sodium: 137 mmol/L (ref 135–145)

## 2019-10-10 MED ORDER — NITROGLYCERIN 0.4 MG SL SUBL: 0.4000 mg | SUBLINGUAL_TABLET | SUBLINGUAL | 0 refills | Status: DC | PRN

## 2019-10-10 MED ORDER — ATORVASTATIN CALCIUM 80 MG PO TABS: 80.0000 mg | ORAL_TABLET | Freq: Every day | ORAL | 11 refills | Status: DC

## 2019-10-10 MED ORDER — POTASSIUM CHLORIDE CRYS ER 20 MEQ PO TBCR
40.0000 meq | EXTENDED_RELEASE_TABLET | ORAL | Status: AC
  Administered 2019-10-10 (×2): 40 meq via ORAL
  Filled 2019-10-10 (×2): qty 2

## 2019-10-10 MED ORDER — ASPIRIN 81 MG PO CHEW: 81.0000 mg | CHEWABLE_TABLET | Freq: Every day | ORAL | 11 refills | Status: DC

## 2019-10-10 MED ORDER — TICAGRELOR 90 MG PO TABS: 90.0000 mg | ORAL_TABLET | Freq: Two times a day (BID) | ORAL | 11 refills | Status: DC

## 2019-10-10 MED FILL — NITROGLYCERIN 0.4 MG TAB SL: 0.4 | 7 days supply | Qty: 25 | Fill #0

## 2019-10-10 MED FILL — ATORVASTATIN CALCIUM 80 MG: 80 | 30 days supply | Qty: 30 | Fill #0

## 2019-10-10 MED FILL — ASPIRIN LOW DOSE 81 MG CHEW: 81 | 30 days supply | Qty: 30 | Fill #0

## 2019-10-10 MED FILL — BRILINTA 90 MG TABLET: 90 | 30 days supply | Qty: 60 | Fill #0

## 2019-10-10 NOTE — TOC Progression Note (Signed)
Transition of Care [facility]) - Progression Note    Patient Details  Name: FLORMARIA MOILANEN MRN: [id number] Date of Birth: 11/22/1966  [facility] CM/SW Contact  Zenon Mayo, RN Phone Number: 10/10/2019, 1:52 PM  Clinical Narrative:    NCM spoke with Patient gave her the [place] coupon, card and TOC is filling the 30 day free Brilinta for her.         Expected Discharge Plan and Services           Expected Discharge Date: 10/10/19                                     Social Determinants of Health (SDOH) Interventions    Readmission Risk Interventions No flowsheet data found.

## 2019-10-10 NOTE — Progress Notes (Signed)
Pt has orders to be discharged. Discharge instructions given and pt has no additional questions at this time. Medication regimen reviewed and pt educated. TOC medications delivered. Pt verbalized understanding and has no additional questions. Telemetry box removed. IV removed and site in good condition. Pt stable and waiting for transportation.

## 2019-10-10 NOTE — Telephone Encounter (Signed)
Spoke with patient. Patient states she was admitted to [facility] on 10/08/19 for MI and stent placement. Has not taken POP in 3 days, was advised by cardiology to f/u with GYN for recommendations.   AEX rescheduled to 11/25/19 at 2:30pm with Dr. Quincy Simmonds.   Advised I will review with Lorin Picket and return call with recommendations. Patient agreeable.   Dr. Quincy Simmonds -please advise.

## 2019-10-10 NOTE — Discharge Summary (Addendum)
Discharge Summary    Patient ID: Carmen Edwards,  MRN: [id number], DOB/AGE: 01/09/1966 53 y.o.  Admit date: 10/08/2019 Discharge date: 10/10/2019  Primary Care Provider: London Pepper Primary Cardiologist: Rozann Lesches, MD  Discharge Diagnoses    Principal Problem:   Acute ST elevation myocardial infarction (STEMI) due to occlusion of left anterior descending (LAD) coronary artery [facility]) Active Problems:   Hypertension   Hyperlipidemia   Allergies Allergies  Allergen Reactions   Amoxicillin Nausea And Vomiting    (high doses) Did it involve swelling of the face/tongue/throat, SOB, or low BP? No Did it involve sudden or severe rash/hives, skin peeling, or any reaction on the inside of your mouth or nose? No Did you need to seek medical attention at a hospital or doctor's office? No When did it last happen?2000 If all above answers are NO, may proceed with cephalosporin use.     Diagnostic Studies/Procedures    Cath: 10/08/19   Prox LAD lesion is 100% stenosed.  A drug-eluting stent was successfully placed using a STENT SYNERGY DES 3.5X20.  Post intervention, there is a 0% residual stenosis.  The left ventricular systolic function is normal.  LV end diastolic pressure is normal.  The left ventricular ejection fraction is 35-45% by visual estimate.  There is no mitral valve regurgitation.   1. Acute anterior ST elevation MI secondary to thrombotic occlusion of the proximal LAD 2. Successful PTCA/DES x 1 proximal LAD 3. No obstructive disease in the Circumflex or RCA 4. Mild segmental LV systolic dysfunction with hypokinesis of the anterior wall and apex. LVEF around 40% by visual estimate.   Recommendations: Will admit to ICU and monitor on telemetry. Will plan to continue DAPT with ASA and Brilinta for one year. Continue beta blocker. Increase Lipitor to 80 mg daily. Will continue Aggrastat for 18 hours given distal LAD thrombus. Echo in am.    Diagnostic Dominance: Right  Intervention    TTE: 10/08/19  IMPRESSIONS    1. Left ventricular ejection fraction, by visual estimation, is 60 to 65%. The left ventricle has normal function. Normal left ventricular size. There is mildly increased left ventricular hypertrophy.  2. Global right ventricle has normal systolic function.The right ventricular size is normal. No increase in right ventricular wall thickness.  3. Left atrial size was normal.  4. Right atrial size was normal.  5. The mitral valve is normal in structure. No evidence of mitral valve regurgitation.  6. The tricuspid valve is normal in structure. Tricuspid valve regurgitation was not visualized by color flow Doppler.  7. The aortic valve is tricuspid Aortic valve regurgitation was not visualized by color flow Doppler. _____________   History of Present Illness     Carmen Edwards is a 53 y.o. female with h/o GERD, HTN, HLD, former tobacco use with chest pain the morning of admission. EMS activated a code STEMI. EKG with anterior ST elevation. Pt with ongoing chest pain upon arrival to [facility]. Pt was diaphoretic. She was taken emergently to the cath lab.   Hospital Course     Underwent cardiac catheterization noted above for acute anterior STEMI secondary to thrombotic occlusion of proximal LAD.  Successful PCI/DES x1 to pLAD.  No obstructive disease noted in the left circumflex or RCA.  Placed on DAPT with aspirin and Brilinta for at least 1 year.  Segmental LV systolic dysfunction with hypokinesis in the anterior wall and apex.  EF was estimated around 40% via LV gram.  High-sensitivity troponin  peaked at 671. Follow-up echocardiogram showed EF improvement of 60 to 65% with mild LVH.  LDL noted at 79, home atorvastatin was increased from 20 mg daily to 80 mg daily.  Noted to have mild hypokalemia which was corrected.  Seen by cardiac rehab and ambulated without recurrent chest pain. She did have questions regarding her  BC, missed 3 doses while admitted. Advised that she reach out to her OB/GYN regarding restarting appropriately. Discussed hormone therapy in conjunction with her CAD, noted her BC is a progesterone only therapy.     General: Well developed, well nourished, female appearing in no acute distress. Head: Normocephalic, atraumatic.  Neck: Supple without bruits, JVD. Lungs:  Resp regular and unlabored, CTA. Heart: RRR, S1, S2, no S3, S4, or murmur; no rub. Abdomen: Soft, non-tender, non-distended with normoactive bowel sounds. No hepatomegaly. No rebound/guarding. No obvious abdominal masses. Extremities: No clubbing, cyanosis, edema. Distal pedal pulses are 2+ bilaterally. Right radial cath site stable without bruising or hematoma Neuro: Alert and oriented X 3. Moves all extremities spontaneously. Psych: Normal affect.  Carmen Edwards was seen by Dr. Irish Lack and determined stable for discharge home. Follow up in the office has been arranged. Medications are listed below.   _____________  Discharge Vitals Blood pressure 132/68, pulse 77, temperature 98.3 F (36.8 C), temperature source Oral, resp. rate 18, height 5\' 5"  (1.651 m), weight 96.9 kg, SpO2 96 %.  Filed Weights   10/08/19 1400 10/09/19 1620 10/10/19 0100  Weight: 98.7 kg 97.1 kg 96.9 kg    Labs & Radiologic Studies    CBC Recent Labs    10/08/19 1117 10/09/19 0421  WBC 21.0* 21.7*  HGB 14.3 15.5*  HCT 42.1 46.7*  MCV 84.9 86.0  PLT 829* [id number]*   Basic Metabolic Panel Recent Labs    10/09/19 0421 10/10/19 0503  NA 136 137  K 4.1 3.0*  CL 108 106  CO2 19* 21*  GLUCOSE 82 82  BUN 7 12  CREATININE 0.56 0.74  CALCIUM 8.6* 8.6*   Liver Function Tests Recent Labs    10/08/19 1117 10/09/19 0421  AST 24 38  ALT 23 23  ALKPHOS 64 75  BILITOT 0.7 1.6*  PROT 5.8* 6.1*  ALBUMIN 3.3* 3.4*   No results for input(s): LIPASE, AMYLASE in the last 72 hours. Cardiac Enzymes No results for input(s): CKTOTAL, CKMB,  CKMBINDEX, TROPONINI in the last 72 hours. BNP Invalid input(s): POCBNP D-Dimer No results for input(s): DDIMER in the last 72 hours. Hemoglobin A1C Recent Labs    10/08/19 1117  HGBA1C 5.5   Fasting Lipid Panel Recent Labs    10/08/19 1117  CHOL 134  HDL 37*  LDLCALC 79  TRIG 90  CHOLHDL 3.6   Thyroid Function Tests No results for input(s): TSH, T4TOTAL, T3FREE, THYROIDAB in the last 72 hours.  Invalid input(s): FREET3 _____________  Dg Thoracic Spine W/swimmers  Result Date: 09/20/2019 CLINICAL DATA:  Thoracic back pain EXAM: THORACIC SPINE - 3 VIEWS COMPARISON:  Chest two-view 08/21/2012. FINDINGS: Normal alignment. Mild loss of height approximately T6 vertebral body involving the superior endplate. This may represent a mild fracture. No other fracture or mass. Disc spaces maintained. IMPRESSION: Mild fracture superior endplate of approximately T6, present in 2013 Electronically Signed   By: Franchot Gallo M.D.   On: 09/20/2019 10:05   Disposition   Pt is being discharged home today in good condition.  Follow-up Plans & Appointments    Follow-up [place],  Aloha Gell, MD Follow up on 10/17/2019.   Specialty: Cardiology Why: at 9:40am for your follow up appt.  Contact information: [place] [address] [phone]          Discharge Instructions    Amb Referral to Cardiac Rehabilitation   Complete by: As directed    Diagnosis:  Coronary Stents STEMI PTCA     After initial evaluation and assessments completed: Virtual Based Care may be provided alone or in conjunction with Phase 2 Cardiac Rehab based on patient barriers.: Yes   Call MD for:  redness, tenderness, or signs of infection (pain, swelling, redness, odor or green/yellow discharge around incision site)   Complete by: As directed    Diet - low sodium heart healthy   Complete by: As directed    Discharge instructions   Complete by: As directed    Radial Site  Care Refer to this sheet in the next few weeks. These instructions provide you with information on caring for yourself after your procedure. Your caregiver may also give you more specific instructions. Your treatment has been planned according to current medical practices, but problems sometimes occur. Call your caregiver if you have any problems or questions after your procedure. HOME CARE INSTRUCTIONS You may shower the day after the procedure.Remove the bandage (dressing) and gently wash the site with plain soap and water.Gently pat the site dry.  Do not apply powder or lotion to the site.  Do not submerge the affected site in water for 3 to 5 days.  Inspect the site at least twice daily.  Do not flex or bend the affected arm for 24 hours.  No lifting over 5 pounds (2.3 kg) for 5 days after your procedure.  What to expect: Any bruising will usually fade within 1 to 2 weeks.  Blood that collects in the tissue (hematoma) may be painful to the touch. It should usually decrease in size and tenderness within 1 to 2 weeks.  SEEK IMMEDIATE MEDICAL CARE IF: You have unusual pain at the radial site.  You have redness, warmth, swelling, or pain at the radial site.  You have drainage (other than a small amount of blood on the dressing).  You have chills.  You have a fever or persistent symptoms for more than 72 hours.  You have a fever and your symptoms suddenly get worse.  Your arm becomes pale, cool, tingly, or numb.  You have heavy bleeding from the site. Hold pressure on the site.   PLEASE DO NOT MISS ANY DOSES OF YOUR BRILINTA!!!!! Also keep a log of you blood pressures and bring back to your follow up appt. Please call the office with any questions.   Patients taking blood thinners should generally stay away from medicines like ibuprofen, Advil, Motrin, naproxen, and Aleve due to risk of stomach bleeding. You may take Tylenol as directed or talk to your primary doctor about  alternatives.  No driving for 2 weeks. No lifting over 10 lbs for 4 weeks. No sexual activity for 4 weeks. You may not return to work until cleared by your cardiologist. Keep procedure site clean & dry. If you notice increased pain, swelling, bleeding or pus, call/return!  You may shower, but no soaking baths/hot tubs/pools for 1 week.   Increase activity slowly   Complete by: As directed       Discharge Medications     Medication List    STOP taking these medications   ibuprofen 200 MG  tablet Commonly known as: ADVIL     TAKE these medications   aspirin 81 MG chewable tablet Chew 1 tablet (81 mg total) by mouth daily. Start taking on: October 11, 2019   atenolol 50 MG tablet Commonly known as: TENORMIN Take 50 mg by mouth daily with lunch.   atorvastatin 80 MG tablet Commonly known as: LIPITOR Take 1 tablet (80 mg total) by mouth at bedtime. What changed:   medication strength  how much to take  Another medication with the same name was removed. Continue taking this medication, and follow the directions you see here.   cholecalciferol 1000 units tablet Commonly known as: VITAMIN D Take 2,000 Units by mouth daily.   nitroGLYCERIN 0.4 MG SL tablet Commonly known as: Nitrostat Place 1 tablet (0.4 mg total) under the tongue every 5 (five) minutes as needed for chest pain.   norethindrone 0.35 MG tablet Commonly known as: MICRONOR Take 1 tablet by mouth once daily   omeprazole 20 MG capsule Commonly known as: PRILOSEC Take 20 mg by mouth daily with lunch.   ticagrelor 90 MG Tabs tablet Commonly known as: BRILINTA Take 1 tablet (90 mg total) by mouth 2 (two) times daily.   traMADol 50 MG tablet Commonly known as: ULTRAM Take 50 mg by mouth 3 (three) times daily as needed for pain.   Vitamin B-12 2500 MCG Subl Take 1 tablet by mouth daily.       Yes                               AHA/ACC Clinical Performance & Quality Measures: 1. Aspirin prescribed? -  Yes 2. ADP Receptor Inhibitor (Plavix/Clopidogrel, Brilinta/Ticagrelor or Effient/Prasugrel) prescribed (includes medically managed patients)? - Yes 3. Beta Blocker prescribed? - Yes 4. High Intensity Statin (Lipitor 40-80mg  or Crestor 20-40mg ) prescribed? - Yes 5. EF assessed during THIS hospitalization? - Yes 6. For EF <40%, was ACEI/ARB prescribed? - Not Applicable (EF >/= [id number]) 7. For EF <40%, Aldosterone Antagonist (Spironolactone or Eplerenone) prescribed? - Not Applicable (EF >/= [id number]) 8. Cardiac Rehab Phase II ordered (Included Medically managed Patients)? - Yes    Outstanding Labs/Studies   FLP/LFTs in 6 weeks if tolerating statin.  Duration of Discharge Encounter   Greater than 30 minutes including physician time.  Signed, Reino Bellis NP-C 10/10/2019, 11:12 AM  I have examined the patient and reviewed assessment and plan and discussed with patient.  Agree with above as stated.    GEN: Well nourished, well developed, in no acute distress  HEENT: normal  Neck: no JVD, carotid bruits, or masses Cardiac: RRR; no murmurs, rubs, or gallops,no edema  Respiratory:  clear to auscultation bilaterally, normal work of breathing GI: soft, nontender, nondistended,  MS: no deformity or atrophy  Skin: warm and dry, no rash Neuro:  Strength and sensation are intact Psych: euthymic mood, full affect  I stressed the importance of dual antiplatelet therapy.  She will check with her gynecologist regarding her birth control pills.  Continue aggressive secondary prevention.  She feels well today.  We will plan for discharge.   Larae Grooms

## 2019-10-10 NOTE — Progress Notes (Signed)
CARDIAC REHAB PHASE I   PRE:  Rate/Rhythm: 74 SR    BP: sitting 127/87    SaO2:   MODE:  Ambulation: 600 ft   POST:  Rate/Rhythm: 92 SR    BP: sitting 135/85     SaO2:   Tolerated well, no c/o. Feels good, ready for d/c. [place], ACSM 10/10/2019 9:59 AM

## 2019-10-10 NOTE — Telephone Encounter (Signed)
I would have her consider just monitoring her menstruation so we know if she still needs her pills or not.  I can reassess with her in November.  If she has problems before then, please have her contact me back.

## 2019-10-10 NOTE — Telephone Encounter (Signed)
Patient is in the hospital and has not taken her birth control in 3 days. Has questions about when should she restart after going home.

## 2019-10-10 NOTE — Telephone Encounter (Signed)
Spoke with patient, advised per Dr. Quincy Simmonds. Patient verbalizes understanding and is agreeable to plan.   Encounter closed.

## 2019-10-14 ENCOUNTER — Ambulatory Visit: Payer: 59 | Admitting: Obstetrics and Gynecology

## 2019-10-17 ENCOUNTER — Encounter: Payer: Self-pay | Admitting: Cardiology

## 2019-10-17 ENCOUNTER — Other Ambulatory Visit: Payer: Self-pay

## 2019-10-17 ENCOUNTER — Ambulatory Visit: Payer: 59 | Admitting: Cardiology

## 2019-10-17 VITALS — BP 129/85 | HR 74 | Temp 96.5°F | Ht 65.0 in | Wt 218.0 lb

## 2019-10-17 DIAGNOSIS — E782 Mixed hyperlipidemia: Secondary | ICD-10-CM

## 2019-10-17 DIAGNOSIS — I10 Essential (primary) hypertension: Secondary | ICD-10-CM

## 2019-10-17 DIAGNOSIS — I2109 ST elevation (STEMI) myocardial infarction involving other coronary artery of anterior wall: Secondary | ICD-10-CM | POA: Diagnosis not present

## 2019-10-17 NOTE — Patient Instructions (Signed)
Medication Instructions:  Your physician recommends that you continue on your current medications as directed. Please refer to the Current Medication list given to you today.  *If you need a refill on your cardiac medications before your next appointment, please call your pharmacy*  Lab Work: Fasting LIPIDS JUST BeFORE next visit If you have labs (blood work) drawn today and your tests are completely normal, you will receive your results only by: Marland Kitchen MyChart Message (if you have MyChart) OR . A paper copy in the mail If you have any lab test that is abnormal or we need to change your treatment, we will call you to review the results.  Testing/Procedures: None today  Follow-Up: At [facility], you and your health needs are our priority.  As part of our continuing mission to provide you with exceptional heart care, we have created designated Provider Care Teams.  These Care Teams include your primary Cardiologist (physician) and Advanced Practice Providers (APPs -  Physician Assistants and Nurse Practitioners) who all work together to provide you with the care you need, when you need it.  Your next appointment:   3 months  The format for your next appointment:   In Person  Provider:   Dr.McDowell  Other Instructions None     Thank you for choosing [facility] !

## 2019-10-17 NOTE — Progress Notes (Signed)
Cardiology Office Note  Date: 10/17/2019   ID: Carmen Edwards, DOB Mar 11, 1966, MRN [id number]  PCP:  London Pepper, MD  Cardiologist:  Rozann Lesches, MD Electrophysiologist:  None   Chief Complaint  Patient presents with  . Hospitalization Follow-up    History of Present Illness: Carmen Edwards is a 53 y.o. female presenting for a post hospital follow-up.  She was recently hospitalized from October 13 to October 15 having presented with an acute anterior STEMI.  She was found to have thrombotic occlusion of the proximal LAD which was successfully treated with DES intervention, otherwise no significant disease in the circumflex or RCA.  LV dysfunction was suggested at angiography, however follow-up echocardiogram showed normal LVEF at 60 to 65%.  She presents today for follow-up, states that she feels much better.  She had apparently been experiencing intermittent back and arm discomfort prior to the presentation above.  I saw Carmen Edwards back in 2017 at which point she had an exercise Myoview that was low risk.  We went over her medications, discussed the importance of continuing dual antiplatelet regimen.  She does not report any obvious intolerances at this time.  Lipid panel done recently showed LDL 79.  She is now on high-dose Lipitor.  She has already been contacted by cardiac rehabilitation, has been walking on her own at home.  Past Medical History:  Diagnosis Date  . Abnormal Pap smear of cervix   . Essential hypertension   . GERD (gastroesophageal reflux disease)   . History of colonic polyps   . History of migraine    Occasional, menustral cycle related  . Hx of chlamydia infection 1989  . Hyperlipidemia   . STEMI (ST elevation myocardial infarction) ([place])    10/08/19 PCI/DESx1 to pLAD    Past Surgical History:  Procedure Laterality Date  . BREAST BIOPSY Right   . BREAST SURGERY  01/2016   Rt.breast bx--?[place]  . COLONOSCOPY WITH PROPOFOL N/A 11/28/2014    Procedure: COLONOSCOPY WITH PROPOFOL;  Surgeon: Cleotis Nipper, MD;  Location: [facility];  Service: Endoscopy;  Laterality: N/A;  . CORONARY/GRAFT ACUTE MI REVASCULARIZATION N/A 10/08/2019   Procedure: Coronary/Graft Acute MI Revascularization;  Surgeon: Burnell Blanks, MD;  Location: [place];  Service: Cardiovascular;  Laterality: N/A;  . Fibroid resection  01/2012   Dr. Joan Flores  . HYSTEROSCOPY  01/2012   Dr. Joan Flores  . LEFT HEART CATH AND CORONARY ANGIOGRAPHY N/A 10/08/2019   Procedure: LEFT HEART CATH AND CORONARY ANGIOGRAPHY;  Surgeon: Burnell Blanks, MD;  Location: [place];  Service: Cardiovascular;  Laterality: N/A;  . SKIN LESION EXCISION     --actually frozen by dermatology--benign  . TOE SURGERY     Toe nail removed    Current Outpatient Medications  Medication Sig Dispense Refill  . aspirin 81 MG chewable tablet Chew 1 tablet (81 mg total) by mouth daily. 30 tablet 11  . atenolol (TENORMIN) 50 MG tablet Take 50 mg by mouth daily with lunch.     Marland Kitchen atorvastatin (LIPITOR) 80 MG tablet Take 1 tablet (80 mg total) by mouth at bedtime. 30 tablet 11  . cholecalciferol (VITAMIN D) 1000 units tablet Take 2,000 Units by mouth daily.     . Cyanocobalamin (VITAMIN B-12) 2500 MCG SUBL Take 1 tablet by mouth daily.     . nitroGLYCERIN (NITROSTAT) 0.4 MG SL tablet Place 1 tablet (0.4 mg total) under the tongue every 5 (five) minutes as needed  for chest pain. 25 tablet 0  . norethindrone (MICRONOR) 0.35 MG tablet Take 1 tablet by mouth once daily 28 tablet 0  . omeprazole (PRILOSEC) 20 MG capsule Take 20 mg by mouth daily with lunch.     . ticagrelor (BRILINTA) 90 MG TABS tablet Take 1 tablet (90 mg total) by mouth 2 (two) times daily. 60 tablet 11  . traMADol (ULTRAM) 50 MG tablet Take 50 mg by mouth 3 (three) times daily as needed for pain.     No current facility-administered medications for this visit.    Allergies:  Amoxicillin   Social  History: The patient  reports that she has been smoking cigarettes. She has been smoking about 0.00 packs per day. She has never used smokeless tobacco. She reports that she does not drink alcohol or use drugs.   ROS:  Please see the history of present illness. Otherwise, complete review of systems is positive for none.  All other systems are reviewed and negative.   Physical Exam: VS:  BP 129/85   Pulse 74   Temp (!) 96.5 F (35.8 C)   Ht 5\' 5"  (1.651 m)   Wt 218 lb (98.9 kg)   SpO2 98%   BMI 36.28 kg/m , BMI Body mass index is 36.28 kg/m.  Wt Readings from Last 3 Encounters:  10/17/19 218 lb (98.9 kg)  10/10/19 213 lb 11.2 oz (96.9 kg)  06/05/18 232 lb (105.2 kg)    General: Patient appears comfortable at rest. HEENT: Conjunctiva and lids normal, wearing a mask. Neck: Supple, no elevated JVP or carotid bruits, no thyromegaly. Lungs: Clear to auscultation, nonlabored breathing at rest. Cardiac: Regular rate and rhythm, no S3 or significant systolic murmur, no pericardial rub. Abdomen: Soft, nontender, bowel sounds present. Extremities: No pitting edema, distal pulses 2+. Skin: Warm and dry. Musculoskeletal: No kyphosis. Neuropsychiatric: Alert and oriented x3, affect grossly appropriate.  ECG:  An ECG dated 10/09/2019 was personally reviewed today and demonstrated:  Sinus rhythm with evolutionary anterolateral ST-T wave abnormalities consistent with history of anterior STEMI.  Recent Labwork: 10/09/2019: ALT 23; AST 38; Hemoglobin 15.5; Platelets 651 10/10/2019: BUN 12; Creatinine, Ser 0.74; Potassium 3.0; Sodium 137     Component Value Date/Time   CHOL 134 10/08/2019 1117   TRIG 90 10/08/2019 1117   HDL 37 (L) 10/08/2019 1117   CHOLHDL 3.6 10/08/2019 1117   VLDL 18 10/08/2019 1117   LDLCALC 79 10/08/2019 1117    Other Studies Reviewed Today:  Cardiac catheterization 10/08/2019:  Prox LAD lesion is 100% stenosed.  A drug-eluting stent was successfully placed  using a STENT SYNERGY DES 3.5X20.  Post intervention, there is a 0% residual stenosis.  The left ventricular systolic function is normal.  LV end diastolic pressure is normal.  The left ventricular ejection fraction is 35-45% by visual estimate.  There is no mitral valve regurgitation.   1. Acute anterior ST elevation MI secondary to thrombotic occlusion of the proximal LAD 2. Successful PTCA/DES x 1 proximal LAD 3. No obstructive disease in the Circumflex or RCA 4. Mild segmental LV systolic dysfunction with hypokinesis of the anterior wall and apex. LVEF around 40% by visual estimate.   Recommendations: Will admit to ICU and monitor on telemetry. Will plan to continue DAPT with ASA and Brilinta for one year. Continue beta blocker. Increase Lipitor to 80 mg daily. Will continue Aggrastat for 18 hours given distal LAD thrombus. Echo in am.   Echocardiogram 10/08/2019:  1. Left ventricular ejection fraction,  by visual estimation, is 60 to 65%. The left ventricle has normal function. Normal left ventricular size. There is mildly increased left ventricular hypertrophy.  2. Global right ventricle has normal systolic function.The right ventricular size is normal. No increase in right ventricular wall thickness.  3. Left atrial size was normal.  4. Right atrial size was normal.  5. The mitral valve is normal in structure. No evidence of mitral valve regurgitation.  6. The tricuspid valve is normal in structure. Tricuspid valve regurgitation was not visualized by color flow Doppler.  7. The aortic valve is tricuspid Aortic valve regurgitation was not visualized by color flow Doppler.  Assessment and Plan:  1.  Recent anterior STEMI due to thrombotic proximal LAD occlusion, status post successful DES intervention.  Otherwise no significant CAD in the circumflex or RCA distribution.  LVEF 60 to 65% by follow-up echocardiogram.  She is recuperating well at this point.  I encouraged her to pursue  cardiac rehabilitation.  Importance of dual antiplatelet therapy also discussed.  2.  Mixed hyperlipidemia, now on high-dose Lipitor.  LDL was 79 recently.  Follow-up fasting lipid profile for next visit.  3.  Essential hypertension, systolic is in the [id number] today.  Continue atenolol.  Medication Adjustments/Labs and Tests Ordered: Current medicines are reviewed at length with the patient today.  Concerns regarding medicines are outlined above.   Tests Ordered: Orders Placed This Encounter  Procedures  . Lipid Profile    Medication Changes: No orders of the defined types were placed in this encounter.   Disposition:  Follow up 2 to 3 months in the [place] office.  Signed, Satira Sark, MD, [facility] 10/17/2019 10:01 AM    [facility] [place]. [address], [place], [place] [address] Phone: [phone]; Fax: [phone]

## 2019-10-23 ENCOUNTER — Other Ambulatory Visit: Payer: Self-pay | Admitting: Obstetrics and Gynecology

## 2019-10-23 DIAGNOSIS — Z1231 Encounter for screening mammogram for malignant neoplasm of breast: Secondary | ICD-10-CM

## 2019-10-24 ENCOUNTER — Other Ambulatory Visit: Payer: Self-pay

## 2019-10-24 ENCOUNTER — Ambulatory Visit
Admission: RE | Admit: 2019-10-24 | Discharge: 2019-10-24 | Disposition: A | Discharge status: Home / Self Care | Payer: 59 | Source: Ambulatory Visit | Attending: Obstetrics and Gynecology | Admitting: Obstetrics and Gynecology

## 2019-10-24 DIAGNOSIS — Z1231 Encounter for screening mammogram for malignant neoplasm of breast: Secondary | ICD-10-CM

## 2019-11-04 NOTE — Progress Notes (Signed)
53 y.o. G32P1011 Married Caucasian female here for annual exam.    Patient had heart attack 10-08-19. She had a stent placed. Had muscle and back pain for months prior to this.  Off [place] since she had her MI. She has had 4 menses in the last year. LMP was October.  Prior LMP was August.   Has urinary incontinence and wears a pad all the time.  She did PT.  States that [facility] do help when she does them.  PCP:  London Pepper, MD   Patient's last menstrual period was 10/11/2019 (exact date).           Sexually active: Yes.    The current method of family planning is withdrawal.   Exercising: Yes.    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 06-06-14 colposcopy--LSIL; Hx of possible cryotherapy or conization of cervix 1998. MMG:  10-24-19 3D/Neg/density B/BiRads1 Colonoscopy: 11/2014 adenomatous polyp;next 11/2009 BMD:  n/a  Result  n/a TDaP: with PCP Gardasil:   n/a HIV: Neg years ago Hep C:Neg years ago Screening Labs:  PCP, cardiology. Flu vaccine:  Completed.   reports that she quit smoking about 4 weeks ago. Her smoking use included cigarettes. She smoked 0.00 packs per day. She has never used smokeless tobacco. She reports that she does not drink alcohol or use drugs.  Past Medical History:  Diagnosis Date  . Abnormal Pap smear of cervix   . Essential hypertension   . GERD (gastroesophageal reflux disease)   . History of colonic polyps   . History of migraine    Occasional, menustral cycle related  . Hx of chlamydia infection 1989  . Hyperlipidemia   . STEMI (ST elevation myocardial infarction) ([place])    10/08/19 PCI/DESx1 to pLAD    Past Surgical History:  Procedure Laterality Date  . BREAST BIOPSY Right   . BREAST SURGERY  01/2016   Rt.breast  bx--?[place]  . COLONOSCOPY WITH PROPOFOL N/A 11/28/2014   Procedure: COLONOSCOPY WITH PROPOFOL;  Surgeon: Cleotis Nipper, MD;  Location: [facility];  Service: Endoscopy;  Laterality: N/A;  . CORONARY/GRAFT ACUTE MI REVASCULARIZATION N/A 10/08/2019   Procedure: Coronary/Graft Acute MI Revascularization;  Surgeon: Burnell Blanks, MD;  Location: [facility];  Service: Cardiovascular;  Laterality: N/A;  . Fibroid resection  01/2012   Dr. Joan Flores  . HYSTEROSCOPY  01/2012   Dr. Joan Flores  . LEFT HEART CATH AND CORONARY ANGIOGRAPHY N/A 10/08/2019   Procedure: LEFT HEART CATH AND CORONARY ANGIOGRAPHY;  Surgeon: Burnell Blanks, MD;  Location: [facility];  Service: Cardiovascular;  Laterality: N/A;  . SKIN LESION EXCISION     --actually frozen by dermatology--benign  . TOE SURGERY     Toe nail removed    Current Outpatient Medications  Medication Sig Dispense Refill  . aspirin 81 MG chewable tablet Chew 1 tablet (81 mg total) by mouth daily. 30 tablet 11  . atenolol (TENORMIN) 50 MG tablet Take 50 mg by mouth daily with lunch.     Marland Kitchen atorvastatin (LIPITOR) 80 MG tablet Take 1 tablet (80 mg total) by mouth at bedtime. 30 tablet 11  . BAYER ASPIRIN EC LOW DOSE 81 MG EC tablet Take 1 tablet by mouth daily.    . cholecalciferol (VITAMIN  D) 1000 units tablet Take 2,000 Units by mouth daily.     . Cyanocobalamin (VITAMIN B-12) 2500 MCG SUBL Take 1 tablet by mouth daily.     . nitroGLYCERIN (NITROSTAT) 0.4 MG SL tablet Place 1 tablet (0.4 mg total) under the tongue every 5 (five) minutes as needed for chest pain. 25 tablet 0  . omeprazole (PRILOSEC) 20 MG capsule Take 20 mg by mouth daily with lunch.     . ticagrelor (BRILINTA) 90 MG TABS tablet Take 1 tablet (90 mg total) by mouth 2 (two) times daily. 60 tablet 11   No current facility-administered medications for this visit.     Family History  Problem Relation Age of Onset  . Cancer - Colon Father   . Hypertension  Father   . Hyperlipidemia Father   . Heart disease Father   . Hyperlipidemia Mother   . Macular degeneration Mother   . Hypertension Brother   . Heart disease Brother 46       Heart attack  . Cancer - Colon Maternal Grandmother   . Heart disease Paternal Grandfather   . Cancer - Colon Maternal Aunt   . Cancer - Colon Maternal Uncle     Review of Systems  All other systems reviewed and are negative.   Exam:   BP 122/76 (Cuff Size: Large)   Pulse 80   Temp (!) 97.5 F (36.4 C) (Temporal)   Resp 16   Ht 5' 4.5" (1.638 m)   Wt 225 lb 12.8 oz (102.4 kg)   LMP 10/11/2019 (Exact Date)   BMI 38.16 kg/m     General appearance: alert, cooperative and appears stated age Head: normocephalic, without obvious abnormality, atraumatic Neck: no adenopathy, supple, symmetrical, trachea midline and thyroid normal to inspection and palpation Lungs: clear to auscultation bilaterally Breasts: normal appearance, no masses or tenderness, No nipple retraction or dimpling, No nipple discharge or bleeding, No axillary adenopathy Heart: regular rate and rhythm Abdomen: soft, non-tender; no masses, no organomegaly Extremities: extremities normal, atraumatic, no cyanosis or edema Skin: skin color, texture, turgor normal. No rashes or lesions Lymph nodes: cervical, supraclavicular, and axillary nodes normal. Neurologic: grossly normal  Pelvic: External genitalia:  Shaven.  Erythema.              No abnormal inguinal nodes palpated.              Urethra:  normal appearing urethra with no masses, tenderness or lesions              Bartholins and Skenes: normal                 Vagina: normal appearing vagina with normal color and clumpy white discharge, no lesions              Cervix: no lesions              Pap taken: No. Bimanual Exam:  Uterus:  normal size, contour, position, consistency, mobility, non-tender              Adnexa: no mass, fullness, tenderness              Rectal exam: Yes.  .   Confirms.              Anus:  normal sphincter tone, no lesions  Chaperone was present for exam.  Assessment:   Well woman visit with normal exam. Perimenopausal. Hx LGSIL.  FH of colon cancer. Personal hx of polyps.  PASH of right breast.  Genuine stress incontinence. Vulvovaginitis.  Recent MI.  Strong FH of cardiovascular disease.  Plan: Mammogram screening discussed. Self breast awareness reviewed. Pap and HR HPV as above.  Next evaluation in 2024. 5 year risk of CIN 3 is 0.03%.  Guidelines for Calcium, Vitamin D, regular exercise program including cardiovascular and weight bearing exercise. Discussed contraceptive options. Barrier methods, IUDs, vasectomy.  Affirm.  We discussed weight loss for reduced risk of cardiovascular disease and for improved stress incontinence. Follow up annually and prn.   After visit summary provided.

## 2019-11-05 ENCOUNTER — Encounter: Payer: Self-pay | Admitting: Obstetrics and Gynecology

## 2019-11-05 ENCOUNTER — Ambulatory Visit: Payer: 59 | Admitting: Obstetrics and Gynecology

## 2019-11-05 ENCOUNTER — Other Ambulatory Visit: Payer: Self-pay

## 2019-11-05 VITALS — BP 122/76 | HR 80 | Temp 97.5°F | Resp 16 | Ht 64.5 in | Wt 225.8 lb

## 2019-11-05 DIAGNOSIS — Z01419 Encounter for gynecological examination (general) (routine) without abnormal findings: Secondary | ICD-10-CM | POA: Diagnosis not present

## 2019-11-05 DIAGNOSIS — N760 Acute vaginitis: Secondary | ICD-10-CM

## 2019-11-05 NOTE — Addendum Note (Signed)
Addended by: Yisroel Ramming, Dietrich Pates E on: 11/05/2019 10:43 AM   Modules accepted: Orders

## 2019-11-05 NOTE — Patient Instructions (Signed)
EXERCISE AND DIET:  We recommended that you start or continue a regular exercise program for good health. Regular exercise means any activity that makes your heart beat faster and makes you sweat.  We recommend exercising at least 30 minutes per day at least 3 days a week, preferably 4 or 5.  We also recommend a diet low in fat and sugar.  Inactivity, poor dietary choices and obesity can cause diabetes, heart attack, stroke, and kidney damage, among others.   ° °ALCOHOL AND SMOKING:  Women should limit their alcohol intake to no more than 7 drinks/beers/glasses of wine (combined, not each!) per week. Moderation of alcohol intake to this level decreases your risk of breast cancer and liver damage. And of course, no recreational drugs are part of a healthy lifestyle.  And absolutely no smoking or even second hand smoke. Most people know smoking can cause heart and lung diseases, but did you know it also contributes to weakening of your bones? Aging of your skin?  Yellowing of your teeth and nails? ° °CALCIUM AND VITAMIN D:  Adequate intake of calcium and Vitamin D are recommended.  The recommendations for exact amounts of these supplements seem to change often, but generally speaking 600 mg of calcium (either carbonate or citrate) and 800 units of Vitamin D per day seems prudent. Certain women may benefit from higher intake of Vitamin D.  If you are among these women, your doctor will have told you during your visit.   ° °PAP SMEARS:  Pap smears, to check for cervical cancer or precancers,  have traditionally been done yearly, although recent scientific advances have shown that most women can have pap smears less often.  However, every woman still should have a physical exam from her gynecologist every year. It will include a breast check, inspection of the vulva and vagina to check for abnormal growths or skin changes, a visual exam of the cervix, and then an exam to evaluate the size and shape of the uterus and  ovaries.  And after 51 years of age, a rectal exam is indicated to check for rectal cancers. We will also provide age appropriate advice regarding health maintenance, like when you should have certain vaccines, screening for sexually transmitted diseases, bone density testing, colonoscopy, mammograms, etc.  ° °MAMMOGRAMS:  All women over 40 years old should have a yearly mammogram. Many facilities now offer a "3D" mammogram, which may cost around $50 extra out of pocket. If possible,  we recommend you accept the option to have the 3D mammogram performed.  It both reduces the number of women who will be called back for extra views which then turn out to be normal, and it is better than the routine mammogram at detecting truly abnormal areas.   ° °COLONOSCOPY:  Colonoscopy to screen for colon cancer is recommended for all women at age 50.  We know, you hate the idea of the prep.  We agree, BUT, having colon cancer and not knowing it is worse!!  Colon cancer so often starts as a polyp that can be seen and removed at colonscopy, which can quite literally save your life!  And if your first colonoscopy is normal and you have no family history of colon cancer, most women don't have to have it again for 10 years.  Once every ten years, you can do something that may end up saving your life, right?  We will be happy to help you get it scheduled when you are ready.    Be sure to check your insurance coverage so you understand how much it will cost.  It may be covered as a preventative service at no cost, but you should check your particular policy.      Intrauterine Device Insertion An intrauterine device (IUD) is a medical device that gets inserted into the uterus to prevent pregnancy. It is a small, T-shaped device that has one or two nylon strings hanging down from it. The strings hang out of the lower part of the uterus (cervix) to allow for future IUD removal. There are two types of IUDs available:  Copper IUD. This  type of IUD has copper wire wrapped around it. Copper makes the uterus and fallopian tubes produce a fluid that kills sperm. A copper IUD may last up to 10 years.  Hormone IUD. This type of IUD is made of plastic and contains the hormone progestin (synthetic progesterone). The hormone thickens mucus in the cervix and prevents sperm from entering the uterus. It also thins the uterine lining to prevent implantation of a fertilized egg. The hormone can weaken or kill the sperm that get into the uterus. A hormone IUD may last 3-5 years. Tell a health care provider about:  Any allergies you have.  All medicines you are taking, including vitamins, herbs, eye drops, creams, and over-the-counter medicines.  Any problems you or family members have had with anesthetic medicines.  Any blood disorders you have.  Any surgeries you have had.  Any medical conditions you have, including any STIs (sexually transmitted infections) you may have.  Whether you are pregnant or may be pregnant. What are the risks? Generally, this is a safe procedure. However, problems may occur, including:  Infection.  Bleeding.  Allergic reactions to medicines.  Accidental puncture (perforation) of the uterus, or damage to other structures or organs.  Accidental placement of the IUD either in the muscle layer of the uterus (myometrium) or outside the uterus.  The IUD falling out of the uterus (expulsion). This is more common among women who have recently had a child.  Pregnancy that happens in the fallopian tube (ectopic pregnancy).  Infection of the uterus and fallopian tubes (pelvic inflammatory disease). What happens before the procedure?  Schedule the IUD insertion for when you will have your menstrual period or right after, to make sure you are not pregnant. Placement of the IUD is better tolerated shortly after a menstrual cycle.  Follow instructions from your health care provider about eating or drinking  restrictions.  Ask your health care provider about changing or stopping your regular medicines. This is especially important if you are taking diabetes medicines or blood thinners.  You may get a pain reliever to take before the procedure.  You may have tests for: ? Pregnancy. A pregnancy test involves having a urine sample taken. ? STIs. Placing an IUD in someone who has an STI can make the infection worse. ? Cervical cancer. You may have a Pap test to check for this type of cancer. This means collecting cells from your cervix to be examined under a microscope.  You may have a physical exam to determine the size and position of your uterus. The procedure may vary among health care providers and hospitals. What happens during the procedure?  A tool (speculum) will be placed in your vagina and widened so that your health care provider can see your cervix.  Medicine may be applied to your cervix to help lower your risk of infection (antiseptic medicine).  You may be given an anesthetic medicine to numb each side of your cervix (intracervical block or paracervical block). This medicine is usually given by an injection into the cervix.  A tool (uterine sound) will be inserted into your uterus to determine the length of your uterus and the direction that your uterus may be tilted.  A slim instrument or tube (IUD inserter) that holds the IUD will be inserted into your vagina, through your cervical canal, and into your uterus.  The IUD will be placed in the uterus, and the IUD inserter will be removed.  The strings that are attached to the IUD will be trimmed so that they lie just below the cervix. The procedure may vary among health care providers and hospitals. What happens after the procedure?  You may have bleeding after the procedure. This is normal. It varies from light bleeding (spotting) for a few days to menstrual-like bleeding.  You may have cramping and pain.  You may feel  dizzy or light-headed.  You may have lower back pain. Summary  An intrauterine device (IUD) is a small, T-shaped device that has one or two nylon strings hanging down from it.  Two types of IUDs are available. You may have a copper IUD or a hormone IUD.  Schedule the IUD insertion for when you will have your menstrual period or right after, to make sure you are not pregnant. Placement of the IUD is better tolerated shortly after a menstrual cycle.  You may have bleeding after the procedure. This is normal. It varies from light spotting for a few days to menstrual-like bleeding. This information is not intended to replace advice given to you by your health care provider. Make sure you discuss any questions you have with your health care provider. Document Released: 08/10/2011 Document Revised: 11/24/2017 Document Reviewed: 11/02/2016 Elsevier Patient Education  2020 [employer].  Contraception Choices Contraception, also called birth control, means things to use or ways to try not to get pregnant. Hormonal birth control This kind of birth control uses hormones. Here are some types of hormonal birth control:  A tube that is put under skin of the arm (implant). The tube can stay in for as long as 3 years.  Shots to get every 3 months (injections).  Pills to take every day (birth control pills).  A patch to change 1 time each week for 3 weeks (birth control patch). After that, the patch is taken off for 1 week.  A ring to put in the vagina. The ring is left in for 3 weeks. Then it is taken out of the vagina for 1 week. Then a new ring is put in.  Pills to take after unprotected sex (emergency birth control pills). Barrier birth control Here are some types of barrier birth control:  A thin covering that is put on the penis before sex (female condom). The covering is thrown away after sex.  A soft, loose covering that is put in the vagina before sex (female condom). The covering is  thrown away after sex.  A rubber bowl that sits over the cervix (diaphragm). The bowl must be made for you. The bowl is put into the vagina before sex. The bowl is left in for 6-8 hours after sex. It is taken out within 24 hours.  A small, soft cup that fits over the cervix (cervical cap). The cup must be made for you. The cup can be left in for 6-8 hours after sex. It is  taken out within 48 hours.  A sponge that is put into the vagina before sex. It must be left in for at least 6 hours after sex. It must be taken out within 30 hours. Then it is thrown away.  A chemical that kills or stops sperm from getting into the uterus (spermicide). It may be a pill, cream, jelly, or foam to put in the vagina. The chemical should be used at least 10-15 minutes before sex. IUD (intrauterine) birth control An IUD is a small, T-shaped piece of plastic. It is put inside the uterus. There are two kinds:  Hormone IUD. This kind can stay in for 3-5 years.  Copper IUD. This kind can stay in for 10 years. Permanent birth control Here are some types of permanent birth control:  Surgery to block the fallopian tubes.  Having an insert put into each fallopian tube.  Surgery to tie off the tubes that carry sperm (vasectomy). Natural planning birth control Here are some types of natural planning birth control:  Not having sex on the days the woman could get pregnant.  Using a calendar: ? To keep track of the length of each period. ? To find out what days pregnancy can happen. ? To plan to not have sex on days when pregnancy can happen.  Watching for symptoms of ovulation and not having sex during ovulation. One way the woman can check for ovulation is to check her temperature.  Waiting to have sex until after ovulation. Summary  Contraception, also called birth control, means things to use or ways to try not to get pregnant.  Hormonal methods of birth control include implants, injections, pills,  patches, vaginal rings, and emergency birth control pills.  Barrier methods of birth control can include female condoms, female condoms, diaphragms, cervical caps, sponges, and spermicides.  There are two types of IUD (intrauterine device) birth control. An IUD can be put in a woman's uterus to prevent pregnancy for 3-5 years.  Permanent sterilization can be done through a procedure for males, females, or both.  Natural planning methods involve not having sex on the days when the woman could get pregnant. This information is not intended to replace advice given to you by your health care provider. Make sure you discuss any questions you have with your health care provider. Document Released: 10/09/2009 Document Revised: 04/03/2019 Document Reviewed: 12/22/2016 Elsevier Patient Education  2020 [employer].

## 2019-11-06 ENCOUNTER — Telehealth: Payer: Self-pay

## 2019-11-06 DIAGNOSIS — B9689 Other specified bacterial agents as the cause of diseases classified elsewhere: Secondary | ICD-10-CM

## 2019-11-06 LAB — VAGINITIS/VAGINOSIS, DNA PROBE
Candida Species: POSITIVE — AB
Gardnerella vaginalis: POSITIVE — AB
Trichomonas vaginosis: NEGATIVE

## 2019-11-06 MED ORDER — METRONIDAZOLE 500 MG PO TABS: 500.0000 mg | ORAL_TABLET | Freq: Two times a day (BID) | ORAL | 0 refills | Status: AC

## 2019-11-06 NOTE — Telephone Encounter (Signed)
Spoke with pt. Pt lab results for BV and yeast given. Pt verbalized understanding. Verified pharmacy on file for Flagyl Rx. Avoid alcohol during treatment and 24 hours after completing medication. Don't mix with alcohol if mixed can cause severe nausea, vomiting and abdominal cramping. Normal side effects of Flagyl may include metallic taste in mouth, slight nausea, headache, abdominal cramping, diarrhea and dizziness.   Flagyl Rx order sent.   Routing to provider for final review. Patient is agreeable to disposition. Will close encounter.

## 2019-11-25 ENCOUNTER — Ambulatory Visit: Payer: Self-pay | Admitting: Obstetrics and Gynecology

## 2020-01-11 LAB — LIPID PANEL
Cholesterol: 144 mg/dL (ref <200)
HDL: 50 mg/dL (ref >=50)
LDL Cholesterol (Calc): 74 mg/dL (calc)
Non-HDL Cholesterol (Calc): 94 mg/dL (calc) (ref <130)
Total CHOL/HDL Ratio: 2.9 (calc) (ref <5.0)
Triglycerides: 110 mg/dL (ref <150)

## 2020-01-13 ENCOUNTER — Ambulatory Visit: Payer: 59 | Admitting: Cardiology

## 2020-01-20 ENCOUNTER — Ambulatory Visit: Payer: 59 | Admitting: Cardiology

## 2020-01-20 ENCOUNTER — Encounter: Payer: Self-pay | Admitting: Cardiology

## 2020-01-20 ENCOUNTER — Other Ambulatory Visit: Payer: Self-pay

## 2020-01-20 VITALS — BP 120/72 | HR 73 | Temp 98.2°F | Ht 65.0 in | Wt 230.0 lb

## 2020-01-20 DIAGNOSIS — I10 Essential (primary) hypertension: Secondary | ICD-10-CM | POA: Diagnosis not present

## 2020-01-20 DIAGNOSIS — I25119 Atherosclerotic heart disease of native coronary artery with unspecified angina pectoris: Secondary | ICD-10-CM | POA: Diagnosis not present

## 2020-01-20 DIAGNOSIS — E782 Mixed hyperlipidemia: Secondary | ICD-10-CM | POA: Diagnosis not present

## 2020-01-20 NOTE — Progress Notes (Signed)
Cardiology Office Note  Date: 01/20/2020   ID: Theary, [place] 12-19-1966, MRN [id number]  PCP:  London Pepper, MD  Cardiologist:  Rozann Lesches, MD Electrophysiologist:  None   Chief Complaint  Patient presents with  . Cardiac follow-up    History of Present Illness: Carmen Edwards is a 54 y.o. female last seen in October 2020.  She presents for a routine visit.  She does not report any angina symptoms, reports mild shortness of breath, not progressive.  She is planning to increase her walking for exercise.  We went over her medications which are outlined below.  She has had some bruising on Brilinta, no obvious major bleeding episodes however.  She has not had to use any nitroglycerin.  Recent follow-up lipid panel showed LDL 74.  She is tolerating high-dose Lipitor.  Past Medical History:  Diagnosis Date  . Abnormal Pap smear of cervix   . Essential hypertension   . GERD (gastroesophageal reflux disease)   . History of colonic polyps   . History of migraine    Occasional, menustral cycle related  . Hx of chlamydia infection 1989  . Hyperlipidemia   . STEMI (ST elevation myocardial infarction) ([place])    10/08/19 PCI/DESx1 to pLAD    Past Surgical History:  Procedure Laterality Date  . BREAST BIOPSY Right   . BREAST SURGERY  01/2016   Rt.breast bx--?[place]  . COLONOSCOPY WITH PROPOFOL N/A 11/28/2014   Procedure: COLONOSCOPY WITH PROPOFOL;  Surgeon: Cleotis Nipper, MD;  Location: [facility];  Service: Endoscopy;  Laterality: N/A;  . CORONARY/GRAFT ACUTE MI REVASCULARIZATION N/A 10/08/2019   Procedure: Coronary/Graft Acute MI Revascularization;  Surgeon: Burnell Blanks, MD;  Location: [facility];  Service: Cardiovascular;  Laterality: N/A;  . Fibroid resection  01/2012   Dr. Joan Flores  . HYSTEROSCOPY  01/2012   Dr. Joan Flores  . LEFT HEART CATH AND CORONARY ANGIOGRAPHY N/A 10/08/2019   Procedure: LEFT HEART CATH AND CORONARY  ANGIOGRAPHY;  Surgeon: Burnell Blanks, MD;  Location: [facility];  Service: Cardiovascular;  Laterality: N/A;  . SKIN LESION EXCISION     --actually frozen by dermatology--benign  . TOE SURGERY     Toe nail removed    Current Outpatient Medications  Medication Sig Dispense Refill  . aspirin 81 MG chewable tablet Chew 1 tablet (81 mg total) by mouth daily. 30 tablet 11  . atenolol (TENORMIN) 50 MG tablet Take 50 mg by mouth daily with lunch.     Marland Kitchen atorvastatin (LIPITOR) 80 MG tablet Take 1 tablet (80 mg total) by mouth at bedtime. 30 tablet 11  . BAYER ASPIRIN EC LOW DOSE 81 MG EC tablet Take 1 tablet by mouth daily.    . cholecalciferol (VITAMIN D) 1000 units tablet Take 2,000 Units by mouth daily.     . Cyanocobalamin (VITAMIN B-12) 2500 MCG SUBL Take 1 tablet by mouth daily.     . nitroGLYCERIN (NITROSTAT) 0.4 MG SL tablet Place 1 tablet (0.4 mg total) under the tongue every 5 (five) minutes as needed for chest pain. 25 tablet 0  . omeprazole (PRILOSEC) 20 MG capsule Take 20 mg by mouth daily with lunch.     . ticagrelor (BRILINTA) 90 MG TABS tablet Take 1 tablet (90 mg total) by mouth 2 (two) times daily. 60 tablet 11   No current facility-administered medications for this visit.   Allergies:  Amoxicillin   Social History: The patient  reports that she  quit smoking about 3 months ago. Her smoking use included cigarettes. She smoked 0.00 packs per day. She has never used smokeless tobacco. She reports that she does not drink alcohol or use drugs.   ROS:  Please see the history of present illness. Otherwise, complete review of systems is positive for none.  All other systems are reviewed and negative.   Physical Exam: VS:  BP 120/72   Pulse 73   Temp 98.2 F (36.8 C)   Ht 5\' 5"  (1.651 m)   Wt 230 lb (104.3 kg)   SpO2 99%   BMI 38.27 kg/m , BMI Body mass index is 38.27 kg/m.  Wt Readings from Last 3 Encounters:  01/20/20 230 lb (104.3 kg)  11/05/19 225 lb  12.8 oz (102.4 kg)  10/17/19 218 lb (98.9 kg)    General: Patient appears comfortable at rest. HEENT: Conjunctiva and lids normal, wearing a mask. Neck: Supple, no elevated JVP or carotid bruits, no thyromegaly. Lungs: Clear to auscultation, nonlabored breathing at rest. Cardiac: Regular rate and rhythm, no S3 or significant systolic murmur, no pericardial rub. Abdomen: Soft, nontender, bowel sounds present. Extremities: No pitting edema, distal pulses 2+.  ECG:  An ECG dated 10/09/2019 was personally reviewed today and demonstrated:  Sinus rhythm with evolutionary anterolateral ST-T wave abnormalities consistent with history of anterior STEMI.  Recent Labwork: 10/09/2019: ALT 23; AST 38; Hemoglobin 15.5; Platelets 651 10/10/2019: BUN 12; Creatinine, Ser 0.74; Potassium 3.0; Sodium 137     Component Value Date/Time   CHOL 144 01/10/2020 1141   TRIG 110 01/10/2020 1141   HDL 50 01/10/2020 1141   CHOLHDL 2.9 01/10/2020 1141   VLDL 18 10/08/2019 1117   LDLCALC 74 01/10/2020 1141    Other Studies Reviewed Today:  Cardiac catheterization 10/08/2019:  Prox LAD lesion is 100% stenosed.  A drug-eluting stent was successfully placed using a STENT SYNERGY DES 3.5X20.  Post intervention, there is a 0% residual stenosis.  The left ventricular systolic function is normal.  LV end diastolic pressure is normal.  The left ventricular ejection fraction is 35-45% by visual estimate.  There is no mitral valve regurgitation.  1. Acute anterior ST elevation MI secondary to thrombotic occlusion of the proximal LAD 2. Successful PTCA/DES x 1 proximal LAD 3. No obstructive disease in the Circumflex or RCA 4. Mild segmental LV systolic dysfunction with hypokinesis of the anterior wall and apex. LVEF around 40% by visual estimate.   Recommendations: Will admit to ICU and monitor on telemetry. Will plan to continue DAPT with ASA and Brilinta for one year. Continue beta blocker. Increase  Lipitor to 80 mg daily. Will continue Aggrastat for 18 hours given distal LAD thrombus. Echo in am.   Echocardiogram 10/08/2019: 1. Left ventricular ejection fraction, by visual estimation, is 60 to 65%. The left ventricle has normal function. Normal left ventricular size. There is mildly increased left ventricular hypertrophy. 2. Global right ventricle has normal systolic function.The right ventricular size is normal. No increase in right ventricular wall thickness. 3. Left atrial size was normal. 4. Right atrial size was normal. 5. The mitral valve is normal in structure. No evidence of mitral valve regurgitation. 6. The tricuspid valve is normal in structure. Tricuspid valve regurgitation was not visualized by color flow Doppler. 7. The aortic valve is tricuspid Aortic valve regurgitation was not visualized by color flow Doppler.  Assessment and Plan:  1.  CAD status post DES intervention to the proximal LAD in the setting of STEMI back  in October 2020.  No obstructive disease in the circumflex or RCA distribution and LVEF normal.  Plan to continue dual antiplatelet regimen and high-dose statin.  Encouraged regular walking regimen for exercise.  2.  Mixed hyperlipidemia, remains on Lipitor 80 mg daily.  LDL 74.  3.  Essential hypertension, blood pressure well controlled today.  No changes made to present antihypertensive regimen.  Medication Adjustments/Labs and Tests Ordered: Current medicines are reviewed at length with the patient today.  Concerns regarding medicines are outlined above.   Tests Ordered: No orders of the defined types were placed in this encounter.   Medication Changes: No orders of the defined types were placed in this encounter.   Disposition:  Follow up 6 months in the [place] office.  Signed, Satira Sark, MD, [facility] 01/20/2020 2:46 PM    [place] at [facility] [address] [place], [place] [address] Phone: [phone]; Fax: [phone]

## 2020-01-20 NOTE — Patient Instructions (Signed)
Medication Instructions:  Your physician recommends that you continue on your current medications as directed. Please refer to the Current Medication list given to you today.  *If you need a refill on your cardiac medications before your next appointment, please call your pharmacy*  Lab Work: None If you have labs (blood work) drawn today and your tests are completely normal, you will receive your results only by: Marland Kitchen MyChart Message (if you have MyChart) OR . A paper copy in the mail If you have any lab test that is abnormal or we need to change your treatment, we will call you to review the results.  Testing/Procedures: None  Follow-Up: At [facility], you and your health needs are our priority.  As part of our continuing mission to provide you with exceptional heart care, we have created designated Provider Care Teams.  These Care Teams include your primary Cardiologist (physician) and Advanced Practice Providers (APPs -  Physician Assistants and Nurse Practitioners) who all work together to provide you with the care you need, when you need it.  Your next appointment:   6 month(s)  The format for your next appointment:   In Person  Provider:   Rozann Lesches, MD  Other Instructions Thank you for choosing [facility] !

## 2020-08-25 ENCOUNTER — Ambulatory Visit: Payer: 59 | Admitting: Cardiology

## 2020-09-30 ENCOUNTER — Other Ambulatory Visit: Payer: Self-pay | Admitting: Cardiovascular Disease

## 2020-10-02 ENCOUNTER — Encounter: Payer: Self-pay | Admitting: Cardiology

## 2020-10-02 ENCOUNTER — Ambulatory Visit: Payer: 59 | Admitting: Cardiology

## 2020-10-02 ENCOUNTER — Other Ambulatory Visit: Payer: Self-pay

## 2020-10-02 VITALS — BP 128/80 | HR 64 | Ht 66.0 in | Wt 224.0 lb

## 2020-10-02 DIAGNOSIS — E782 Mixed hyperlipidemia: Secondary | ICD-10-CM

## 2020-10-02 DIAGNOSIS — I10 Essential (primary) hypertension: Secondary | ICD-10-CM | POA: Diagnosis not present

## 2020-10-02 DIAGNOSIS — I25119 Atherosclerotic heart disease of native coronary artery with unspecified angina pectoris: Secondary | ICD-10-CM | POA: Diagnosis not present

## 2020-10-02 NOTE — Patient Instructions (Signed)
Medication Instructions:   STOP Brilinta when the bottle is finished  *If you need a refill on your cardiac medications before your next appointment, please call your pharmacy*   Lab Work: None today If you have labs (blood work) drawn today and your tests are completely normal, you will receive your results only by: Marland Kitchen MyChart Message (if you have MyChart) OR . A paper copy in the mail If you have any lab test that is abnormal or we need to change your treatment, we will call you to review the results.   Testing/Procedures: None today   Follow-Up: At [facility], you and your health needs are our priority.  As part of our continuing mission to provide you with exceptional heart care, we have created designated Provider Care Teams.  These Care Teams include your primary Cardiologist (physician) and Advanced Practice Providers (APPs -  Physician Assistants and Nurse Practitioners) who all work together to provide you with the care you need, when you need it.  We recommend signing up for the patient portal called "MyChart".  Sign up information is provided on this After Visit Summary.  MyChart is used to connect with patients for Virtual Visits (Telemedicine).  Patients are able to view lab/test results, encounter notes, upcoming appointments, etc.  Non-urgent messages can be sent to your provider as well.   To learn more about what you can do with MyChart, go to [email or web address].    Your next appointment:   6 month(s)  The format for your next appointment:   In Person  Provider:   Rozann Lesches, MD   Other Instructions None     Thank you for choosing [facility] !

## 2020-10-02 NOTE — Progress Notes (Signed)
Cardiology Office Note  Date: 10/02/2020   ID: Carmen Edwards, Carmen Edwards 07/28/1966, MRN [id number]  PCP:  London Pepper, MD  Cardiologist:  Rozann Lesches, MD Electrophysiologist:  None   Chief Complaint  Patient presents with  . Cardiac follow-up    History of Present Illness: Carmen Edwards is a 54 y.o. female last seen in January. She presents for a routine visit. Overall doing well, no active angina symptoms or nitroglycerin use. She reports no worsening shortness of breath with typical activities, no palpitations or syncope.  We went over her medications which are listed below. She states that she has been compliant. Plan will be to stop Brilinta at the end of this month, 1 year out from DES intervention.  I personally reviewed her ECG today which shows normal sinus rhythm.  She had lab work with her PCP in the interim, results to be requested.  Past Medical History:  Diagnosis Date  . Abnormal Pap smear of cervix   . Essential hypertension   . GERD (gastroesophageal reflux disease)   . History of colonic polyps   . History of migraine    Occasional, menustral cycle related  . Hx of chlamydia infection 1989  . Hyperlipidemia   . STEMI (ST elevation myocardial infarction) ([place])    10/08/19 PCI/DESx1 to pLAD    Past Surgical History:  Procedure Laterality Date  . BREAST BIOPSY Right   . BREAST SURGERY  01/2016   Rt.breast bx--?[place]  . COLONOSCOPY WITH PROPOFOL N/A 11/28/2014   Procedure: COLONOSCOPY WITH PROPOFOL;  Surgeon: Cleotis Nipper, MD;  [facility];  Service: Endoscopy;  Laterality: N/A;  . CORONARY/GRAFT ACUTE MI REVASCULARIZATION N/A 10/08/2019   Procedure: Coronary/Graft Acute MI Revascularization;  Surgeon: Burnell Blanks, MD;  Location: [facility];  Service: Cardiovascular;  Laterality: N/A;  . Fibroid resection  01/2012   Dr. Joan Flores  . HYSTEROSCOPY  01/2012   Dr. Joan Flores  . LEFT HEART CATH AND CORONARY  ANGIOGRAPHY N/A 10/08/2019   Procedure: LEFT HEART CATH AND CORONARY ANGIOGRAPHY;  Surgeon: Burnell Blanks, MD;  Location: [facility];  Service: Cardiovascular;  Laterality: N/A;  . SKIN LESION EXCISION     --actually frozen by dermatology--benign  . TOE SURGERY     Toe nail removed    Current Outpatient Medications  Medication Sig Dispense Refill  . atenolol (TENORMIN) 50 MG tablet Take 50 mg by mouth daily with lunch.     Marland Kitchen atorvastatin (LIPITOR) 80 MG tablet TAKE 1 TABLET BY MOUTH ONCE DAILY AT BEDTIME 90 tablet 0  . BAYER ASPIRIN EC LOW DOSE 81 MG EC tablet Take 1 tablet by mouth daily.    . cholecalciferol (VITAMIN D) 1000 units tablet Take 2,000 Units by mouth daily.     . Cyanocobalamin (VITAMIN B-12) 2500 MCG SUBL Take 1 tablet by mouth daily.     . nitroGLYCERIN (NITROSTAT) 0.4 MG SL tablet Place 1 tablet (0.4 mg total) under the tongue every 5 (five) minutes as needed for chest pain. 25 tablet 0  . omeprazole (PRILOSEC) 20 MG capsule Take 20 mg by mouth daily with lunch.      No current facility-administered medications for this visit.   Allergies:  Amoxicillin   ROS: No palpitations or syncope.  Physical Exam: VS:  BP 128/80   Pulse 64   Ht 5\' 6"  (1.676 m)   Wt 224 lb (101.6 kg)   SpO2 99%   BMI 36.15 kg/m ,  BMI Body mass index is 36.15 kg/m.  Wt Readings from Last 3 Encounters:  10/02/20 224 lb (101.6 kg)  01/20/20 230 lb (104.3 kg)  11/05/19 225 lb 12.8 oz (102.4 kg)    General: Patient appears comfortable at rest. HEENT: Conjunctiva and lids normal, wearing a mask. Neck: Supple, no elevated JVP or carotid bruits, no thyromegaly. Lungs: Clear to auscultation, nonlabored breathing at rest. Cardiac: Regular rate and rhythm, no S3 or significant systolic murmur. Abdomen: Soft, nontender, bowel sounds present. Extremities: No pitting edema, distal pulses 2+.  ECG:  An ECG dated 10/09/2019 was personally reviewed today and demonstrated:  Sinus  rhythm with evolutionary anterolateral ST-T wave abnormalities consistent wiht history of anterior STEMI.  Recent Labwork: 10/09/2019: ALT 23; AST 38; Hemoglobin 15.5; Platelets 651 10/10/2019: BUN 12; Creatinine, Ser 0.74; Potassium 3.0; Sodium 137     Component Value Date/Time   CHOL 144 01/10/2020 1141   TRIG 110 01/10/2020 1141   HDL 50 01/10/2020 1141   CHOLHDL 2.9 01/10/2020 1141   VLDL 18 10/08/2019 1117   LDLCALC 74 01/10/2020 1141    Other Studies Reviewed Today:  Cardiac catheterization 10/08/2019:  Prox LAD lesion is 100% stenosed.  A drug-eluting stent was successfully placed using a STENT SYNERGY DES 3.5X20.  Post intervention, there is a 0% residual stenosis.  The left ventricular systolic function is normal.  LV end diastolic pressure is normal.  The left ventricular ejection fraction is 35-45% by visual estimate.  There is no mitral valve regurgitation.  1. Acute anterior ST elevation MI secondary to thrombotic occlusion of the proximal LAD 2. Successful PTCA/DES x 1 proximal LAD 3. No obstructive disease in the Circumflex or RCA 4. Mild segmental LV systolic dysfunction with hypokinesis of the anterior wall and apex. LVEF around 40% by visual estimate.   Recommendations: Will admit to ICU and monitor on telemetry. Will plan to continue DAPT with ASA and Brilinta for one year. Continue beta blocker. Increase Lipitor to 80 mg daily. Will continue Aggrastat for 18 hours given distal LAD thrombus. Echo in am.  Echocardiogram 10/08/2019: 1. Left ventricular ejection fraction, by visual estimation, is 60 to 65%. The left ventricle has normal function. Normal left ventricular size. There is mildly increased left ventricular hypertrophy. 2. Global right ventricle has normal systolic function.The right ventricular size is normal. No increase in right ventricular wall thickness. 3. Left atrial size was normal. 4. Right atrial size was normal. 5. The mitral  valve is normal in structure. No evidence of mitral valve regurgitation. 6. The tricuspid valve is normal in structure. Tricuspid valve regurgitation was not visualized by color flow Doppler. 7. The aortic valve is tricuspid Aortic valve regurgitation was not visualized by color flow Doppler.  Assessment and Plan:  1. CAD status post DES to the proximal LAD in October of last year. She is doing well without angina symptoms on medical therapy. Plan to stop Brilinta at the end of this month. Otherwise continue aspirin, atenolol, Lipitor, and as needed nitroglycerin.  2. Mixed hyperlipidemia, continues on Lipitor. Requesting lab work from PCP.  3. Essential hypertension, blood pressure control is adequate today.  Medication Adjustments/Labs and Tests Ordered: Current medicines are reviewed at length with the patient today.  Concerns regarding medicines are outlined above.   Tests Ordered: Orders Placed This Encounter  Procedures  . EKG 12-Lead    Medication Changes: No orders of the defined types were placed in this encounter.   Disposition:  Follow up 6 months  in the [place] office.  Signed, Satira Sark, MD, [facility] 10/02/2020 2:55 PM    [place] at [facility] [address] [place], [place] [address] Phone: [phone]; Fax: [phone]

## 2020-11-05 ENCOUNTER — Encounter: Payer: Self-pay | Admitting: Obstetrics and Gynecology

## 2020-11-05 ENCOUNTER — Other Ambulatory Visit: Payer: Self-pay

## 2020-11-05 ENCOUNTER — Ambulatory Visit: Payer: 59 | Admitting: Obstetrics and Gynecology

## 2020-11-05 VITALS — BP 118/80 | HR 72 | Resp 16 | Ht 64.5 in | Wt 228.0 lb

## 2020-11-05 DIAGNOSIS — Z01419 Encounter for gynecological examination (general) (routine) without abnormal findings: Secondary | ICD-10-CM

## 2020-11-05 NOTE — Patient Instructions (Signed)

## 2020-11-05 NOTE — Progress Notes (Signed)
54 y.o. G50P1011 Married Caucasian female here for annual exam.    Her LMP was June, 2021 and prior LMP was January or February.  Having increased hot flashes, and doing OK with this.   Still with light urinary leakage.  Does Kegel's.   Has stopped her blood thinner.  Now just taking her cholesterol medication and ASA.   Has seen her PCP and dermatologist this year.   Did her Covid vaccine.  Did flu vaccine.   PCP: London Pepper, MD    Patient's last menstrual period was 06/15/2020 (within days).           Sexually active: Yes.    The current method of family planning is withdrawal.    Exercising: No.  The patient does not participate in regular exercise at present. Smoker:  no  Health Maintenance: Pap:  06-05-18 Neg:Neg HR HPV  05/19/17 Pap and HR HPV neg  05/06/16 Pap and HR HPV neg History of abnormal Pap:  Yes, 05-01-15 ASCUS:Pos HR HPV; colpo 05-22-15 LGSIL--stable;no txment--rpt pap HPV 40yr. 06-06-14 colposcopy--LSIL; Hx of possible cryotherapy or conization of cervix 1998 MMG:  10/24/19 BIRADS 1 negative/density b Colonoscopy:  11/2014 adenomatous polyp.  She will get her colonoscopy scheduled. BMD:   n/a  Result  n/a TDaP:  UTD with PCP Gardasil:   n/a HIV: Neg years ago Hep C: Neg years ago Screening Labs: PCP   reports that she quit smoking about 12 months ago. Her smoking use included cigarettes. She smoked 0.00 packs per day. She has never used smokeless tobacco. She reports that she does not drink alcohol and does not use drugs.  Past Medical History:  Diagnosis Date  . Abnormal Pap smear of cervix   . Essential hypertension   . GERD (gastroesophageal reflux disease)   . History of colonic polyps   . History of migraine    Occasional, menustral cycle related  . Hx of chlamydia infection 1989  . Hyperlipidemia   . STEMI (ST elevation myocardial infarction) ([place])    10/08/19 PCI/DESx1 to pLAD    Past Surgical History:  Procedure Laterality Date  . BREAST  BIOPSY Right   . BREAST SURGERY  01/2016   Rt.breast bx--?[place]  . COLONOSCOPY WITH PROPOFOL N/A 11/28/2014   Procedure: COLONOSCOPY WITH PROPOFOL;  Surgeon: Cleotis Nipper, MD;  Location: [facility];  Service: Endoscopy;  Laterality: N/A;  . CORONARY/GRAFT ACUTE MI REVASCULARIZATION N/A 10/08/2019   Procedure: Coronary/Graft Acute MI Revascularization;  Surgeon: Burnell Blanks, MD;  Location: [place] [facility];  Service: Cardiovascular;  Laterality: N/A;  . Fibroid resection  01/2012   Dr. Joan Flores  . HYSTEROSCOPY  01/2012   Dr. Joan Flores  . LEFT HEART CATH AND CORONARY ANGIOGRAPHY N/A 10/08/2019   Procedure: LEFT HEART CATH AND CORONARY ANGIOGRAPHY;  Surgeon: Burnell Blanks, MD;  Location: [place] [facility];  Service: Cardiovascular;  Laterality: N/A;  . SKIN LESION EXCISION     --actually frozen by dermatology--benign  . TOE SURGERY     Toe nail removed    Current Outpatient Medications  Medication Sig Dispense Refill  . atenolol (TENORMIN) 50 MG tablet Take 50 mg by mouth daily with lunch.     Marland Kitchen atorvastatin (LIPITOR) 80 MG tablet TAKE 1 TABLET BY MOUTH ONCE DAILY AT BEDTIME 90 tablet 0  . BAYER ASPIRIN EC LOW DOSE 81 MG EC tablet Take 1 tablet by mouth daily.    . cholecalciferol (VITAMIN D) 1000 units tablet Take 2,000 Units  by mouth daily.     . Cyanocobalamin (VITAMIN B-12) 2500 MCG SUBL Take 1 tablet by mouth daily.     . nitroGLYCERIN (NITROSTAT) 0.4 MG SL tablet Place 1 tablet (0.4 mg total) under the tongue every 5 (five) minutes as needed for chest pain. 25 tablet 0  . omeprazole (PRILOSEC) 20 MG capsule Take 20 mg by mouth daily with lunch.     . Probiotic Product (PROBIOTIC PO) Take by mouth.     No current facility-administered medications for this visit.    Family History  Problem Relation Age of Onset  . Cancer - Colon Father   . Hypertension Father   . Hyperlipidemia Father   . Heart disease Father   . Hyperlipidemia Mother   . Macular  degeneration Mother   . Hypertension Brother   . Heart disease Brother 37       Heart attack  . Cancer - Colon Maternal Grandmother   . Heart disease Paternal Grandfather   . Cancer - Colon Maternal Aunt   . Cancer - Colon Maternal Uncle     Review of Systems  Constitutional: Negative.   HENT: Negative.   Eyes: Negative.   Respiratory: Negative.   Cardiovascular: Negative.   Gastrointestinal: Negative.   Endocrine: Negative.   Genitourinary: Negative.   Musculoskeletal: Negative.   Skin: Negative.   Allergic/Immunologic: Negative.   Neurological: Negative.   Hematological: Negative.   Psychiatric/Behavioral: Negative.     Exam:   BP 118/80 (BP Location: Right Arm, Patient Position: Sitting, Cuff Size: Normal)   Pulse 72   Resp 16   Ht 5' 4.5" (1.638 m)   Wt 228 lb (103.4 kg)   LMP 06/15/2020 (Within Days)   BMI 38.53 kg/m     General appearance: alert, cooperative and appears stated age Head: normocephalic, without obvious abnormality, atraumatic Neck: no adenopathy, supple, symmetrical, trachea midline and thyroid normal to inspection and palpation Lungs: clear to auscultation bilaterally Breasts: normal appearance, no masses or tenderness, No nipple retraction or dimpling, No nipple discharge or bleeding, No axillary adenopathy Heart: regular rate and rhythm Abdomen: soft, non-tender; no masses, no organomegaly Extremities: extremities normal, atraumatic, no cyanosis or edema Skin: skin color, texture, turgor normal. No rashes or lesions Lymph nodes: cervical, supraclavicular, and axillary nodes normal. Neurologic: grossly normal  Pelvic: External genitalia:  no lesions              No abnormal inguinal nodes palpated.              Urethra:  normal appearing urethra with no masses, tenderness or lesions              Bartholins and Skenes: normal                 Vagina: normal appearing vagina with normal color and discharge, no lesions.  Left vaginal apical skin  tag.               Cervix: no lesions              Pap taken: No. Bimanual Exam:  Uterus:  normal size, contour, position, consistency, mobility, non-tender              Adnexa: no mass, fullness, tenderness              Rectal exam: Yes.  .  Confirms.              Anus:  normal sphincter tone, no lesions  Chaperone was present for exam.  Assessment:   Well woman visit with normal exam. Perimenopausal. Menopausal symptoms. Hx LGSIL.  FH of colon cancer. Personal hx of polyps.  PASH of right breast.  Genuine stress incontinence. History of MI.  Left vaginal apical skin tag.  Will do observational management.  Plan: Mammogram screening discussed.  She will schedule.  Self breast awareness reviewed. Pap and HR HPV in 2024.  Guidelines for Calcium, Vitamin D, regular exercise program including cardiovascular and weight bearing exercise. We discussed herbal remedies, SSRIs, gabapentin for menopausal symptoms.  She will try Estroven.  Weight loss encouraged.  Brochure on menopause.  Follow up annually and prn.

## 2020-11-06 ENCOUNTER — Encounter: Payer: Self-pay | Admitting: Obstetrics and Gynecology

## 2021-02-15 ENCOUNTER — Other Ambulatory Visit: Payer: Self-pay | Admitting: Cardiovascular Disease

## 2021-03-11 ENCOUNTER — Other Ambulatory Visit: Payer: Self-pay | Admitting: Obstetrics and Gynecology

## 2021-03-11 DIAGNOSIS — Z1231 Encounter for screening mammogram for malignant neoplasm of breast: Secondary | ICD-10-CM

## 2021-05-04 ENCOUNTER — Other Ambulatory Visit: Payer: Self-pay

## 2021-05-04 ENCOUNTER — Ambulatory Visit
Admission: RE | Admit: 2021-05-04 | Discharge: 2021-05-04 | Disposition: A | Discharge status: Home / Self Care | Payer: Commercial Managed Care - PPO | Source: Ambulatory Visit | Attending: Obstetrics and Gynecology | Admitting: Obstetrics and Gynecology

## 2021-05-04 DIAGNOSIS — Z1231 Encounter for screening mammogram for malignant neoplasm of breast: Secondary | ICD-10-CM

## 2021-06-17 ENCOUNTER — Ambulatory Visit: Payer: Commercial Managed Care - PPO | Admitting: Obstetrics and Gynecology

## 2021-06-17 ENCOUNTER — Other Ambulatory Visit: Payer: Self-pay

## 2021-06-17 ENCOUNTER — Other Ambulatory Visit (HOSPITAL_COMMUNITY)
Admission: RE | Admit: 2021-06-17 | Discharge: 2021-06-17 | Disposition: A | Discharge status: Home / Self Care | Payer: Commercial Managed Care - PPO | Source: Ambulatory Visit | Attending: Obstetrics and Gynecology | Admitting: Obstetrics and Gynecology

## 2021-06-17 ENCOUNTER — Encounter: Payer: Self-pay | Admitting: Obstetrics and Gynecology

## 2021-06-17 VITALS — BP 110/68 | HR 66 | Ht 64.5 in | Wt 233.0 lb

## 2021-06-17 DIAGNOSIS — N926 Irregular menstruation, unspecified: Secondary | ICD-10-CM | POA: Diagnosis not present

## 2021-06-17 DIAGNOSIS — Z124 Encounter for screening for malignant neoplasm of cervix: Secondary | ICD-10-CM

## 2021-06-17 DIAGNOSIS — N939 Abnormal uterine and vaginal bleeding, unspecified: Secondary | ICD-10-CM

## 2021-06-17 LAB — PREGNANCY, URINE: Preg Test, Ur: NEGATIVE

## 2021-06-17 NOTE — Progress Notes (Signed)
GYNECOLOGY  VISIT   HPI: 55 y.o.   Married  Caucasian  female   G2P1011 with Patient's last menstrual period was 05/17/2021.   here for irregular vaginal bleeding.  Has been skipping cycles off and on for 6-8 month. Then bled  heavily for 2 weeks in May. No pain.   Hot flashes stopped but did return after having bleeding.   Wondering if she is in menopause or has thyroid problem. Also has hx of fibroids.   Not doing any hormonal treatment.   [name]  GYNECOLOGIC HISTORY: Patient's last menstrual period was 05/17/2021. Contraception: withdrawal Menopausal hormone therapy:   Last mammogram: 05-04-21 3D/Neg/Birads1 Last pap smear:   06-05-18 Neg:Neg HR HPV, 05/19/17 Pap and HR HPV neg,  05/06/16 Pap and HR HPV neg        OB History     Gravida  2   Para  1   Term  1   Preterm      AB  1   Living  1      SAB      IAB  1   Ectopic      Multiple      Live Births  1              Patient Active Problem List   Diagnosis Date Noted   Hypertension 10/10/2019   Hyperlipidemia 10/10/2019   Acute ST elevation myocardial infarction (STEMI) due to occlusion of left anterior descending (LAD) coronary artery (HCC)    Atypical squamous cell changes of undetermined significance (ASCUS) on cervical cytology with positive high risk human papilloma virus (HPV) 06/06/2014    Past Medical History:  Diagnosis Date   Abnormal Pap smear of cervix    Essential hypertension    GERD (gastroesophageal reflux disease)    History of colonic polyps    History of migraine    Occasional, menustral cycle related   Hx of chlamydia infection 1989   Hyperlipidemia    STEMI (ST elevation myocardial infarction) ([place])    10/08/19 PCI/DESx1 to pLAD    Past Surgical History:  Procedure Laterality Date   BREAST BIOPSY Right    BREAST SURGERY  01/2016   Rt.breast bx--?[place]   COLONOSCOPY WITH PROPOFOL N/A 11/28/2014   Procedure: COLONOSCOPY WITH PROPOFOL;  Surgeon: Cleotis Nipper, MD;   [facility];  Service: Endoscopy;  Laterality: N/A;   CORONARY/GRAFT ACUTE MI REVASCULARIZATION N/A 10/08/2019   Procedure: Coronary/Graft Acute MI Revascularization;  Surgeon: Burnell Blanks, MD;  Location: [place] [facility];  Service: Cardiovascular;  Laterality: N/A;   Fibroid resection  01/2012   Dr. Joan Flores   HYSTEROSCOPY  01/2012   Dr. Joan Flores   LEFT HEART CATH AND CORONARY ANGIOGRAPHY N/A 10/08/2019   Procedure: LEFT HEART CATH AND CORONARY ANGIOGRAPHY;  Surgeon: Burnell Blanks, MD;  Location: [place] [facility];  Service: Cardiovascular;  Laterality: N/A;   SKIN LESION EXCISION     --actually frozen by dermatology--benign   TOE SURGERY     Toe nail removed    Current Outpatient Medications  Medication Sig Dispense Refill   atenolol (TENORMIN) 50 MG tablet Take 50 mg by mouth daily with lunch.      atorvastatin (LIPITOR) 80 MG tablet Take 1 tablet (80 mg total) by mouth at bedtime. 90 tablet 2   BAYER ASPIRIN EC LOW DOSE 81 MG EC tablet Take 1 tablet by mouth daily.     cholecalciferol (VITAMIN D) 1000 units tablet Take  2,000 Units by mouth daily.      Cyanocobalamin (VITAMIN B-12) 2500 MCG SUBL Take 1 tablet by mouth daily.      omeprazole (PRILOSEC) 20 MG capsule Take 20 mg by mouth daily with lunch.      Probiotic Product (PROBIOTIC PO) Take by mouth.     nitroGLYCERIN (NITROSTAT) 0.4 MG SL tablet Place 1 tablet (0.4 mg total) under the tongue every 5 (five) minutes as needed for chest pain. 25 tablet 0   No current facility-administered medications for this visit.     ALLERGIES: Amoxicillin  Family History  Problem Relation Age of Onset   Cancer - Colon Father    Hypertension Father    Hyperlipidemia Father    Heart disease Father    Hyperlipidemia Mother    Macular degeneration Mother    Hypertension Brother    Heart disease Brother 75       Heart attack   Cancer - Colon Maternal Grandmother    Heart disease Paternal Grandfather     Cancer - Colon Maternal Aunt    Cancer - Colon Maternal Uncle     Social History   Socioeconomic History   Marital status: Married    Spouse name: Not on file   Number of children: Not on file   Years of education: Not on file   Highest education level: Not on file  Occupational History   Not on file  Tobacco Use   Smoking status: Former    Packs/day: 0.00    Pack years: 0.00    Types: Cigarettes    Quit date: 10/08/2019    Years since quitting: 1.6   Smokeless tobacco: Never   Tobacco comments:    smokes 1/2 pack per year  Vaping Use   Vaping Use: Never used  Substance and Sexual Activity   Alcohol use: No    Alcohol/week: 0.0 standard drinks   Drug use: No   Sexual activity: Yes    Partners: Male    Birth control/protection: Other-see comments    Comment: withdrawal  Other Topics Concern   Not on file  Social History Narrative   Not on file   Social Determinants of Health   Financial Resource Strain: Not on file  Food Insecurity: Not on file  Transportation Needs: Not on file  Physical Activity: Not on file  Stress: Not on file  Social Connections: Not on file  Intimate Partner Violence: Not on file    Review of Systems  All other systems reviewed and are negative.  PHYSICAL EXAMINATION:    BP 110/68   Pulse 66   Ht 5' 4.5" (1.638 m)   Wt 233 lb (105.7 kg)   LMP 05/17/2021   SpO2 98%   BMI 39.38 kg/m     General appearance: alert, cooperative and appears stated age   Pelvic: External genitalia:  no lesions              Urethra:  normal appearing urethra with no masses, tenderness or lesions              Bartholins and Skenes: normal                 Vagina: normal appearing vagina with normal color and discharge, no lesions              Cervix: no lesions.  Cervix bleeds slightly with pap.  Bimanual Exam:  Uterus:  normal size, contour, position, consistency, mobility, non-tender              Adnexa: no mass, fullness,  tenderness              Rectal exam:yes  Confirms.              Anus:  normal sphincter tone, no lesions  Chaperone was present for exam.  ASSESSMENT  Abnormal uterine bleeding.   May be anovulatory bleeding.  Hx PASH right breast.  Hx MI. Migraine. Hx hysteroscopic myomectomy.   PLAN  Reasons for bleeding discussed - anovulation, fibroids, polyps, ovarian cysts, abnormal cells.  Pap and HR HPV collected.  Return for pelvic US and lab work - TSH, estradiol, and [place].   20 min total time was spent for this patient encounter, including preparation, face-to-face counseling with the patient, coordination of care, and documentation of the encounter.

## 2021-06-18 ENCOUNTER — Telehealth: Payer: Self-pay | Admitting: Obstetrics and Gynecology

## 2021-06-18 NOTE — Telephone Encounter (Signed)
Please schedule patient for a pelvic ultrasound, office blood work and recheck with me next week for abnormal uterine bleeding.   Her insurance runs out at the end of this month, June 2022.   If we do not have any ultrasound openings at our office next week, please schedule with [facility] and have her return to the office for discussion of results and blood work the next day.  Hopefully, this would give enough time for the results to be read.

## 2021-06-22 ENCOUNTER — Encounter: Payer: Self-pay | Admitting: Obstetrics and Gynecology

## 2021-06-22 ENCOUNTER — Other Ambulatory Visit: Payer: Commercial Managed Care - PPO

## 2021-06-22 ENCOUNTER — Ambulatory Visit: Payer: Commercial Managed Care - PPO | Admitting: Obstetrics and Gynecology

## 2021-06-22 ENCOUNTER — Other Ambulatory Visit: Payer: Self-pay

## 2021-06-22 ENCOUNTER — Ambulatory Visit (INDEPENDENT_AMBULATORY_CARE_PROVIDER_SITE_OTHER): Payer: Commercial Managed Care - PPO

## 2021-06-22 VITALS — BP 138/82 | HR 65 | Ht 64.5 in | Wt 233.0 lb

## 2021-06-22 DIAGNOSIS — N939 Abnormal uterine and vaginal bleeding, unspecified: Secondary | ICD-10-CM | POA: Diagnosis not present

## 2021-06-22 DIAGNOSIS — D219 Benign neoplasm of connective and other soft tissue, unspecified: Secondary | ICD-10-CM | POA: Diagnosis not present

## 2021-06-22 DIAGNOSIS — N83202 Unspecified ovarian cyst, left side: Secondary | ICD-10-CM

## 2021-06-22 LAB — CYTOLOGY - PAP
Comment: NEGATIVE
Diagnosis: UNDETERMINED — AB
High risk HPV: NEGATIVE

## 2021-06-22 NOTE — Progress Notes (Signed)
GYNECOLOGY  VISIT   HPI: 55 y.o.   Married  Caucasian  female   G2P1011 with Patient's last menstrual period was 05/17/2021 (exact date).   here for pelvic ultrasound for abnormal uterine bleeding.   Skipping menses off and on for 6 - 8 months and then had a heavy cycle for 2 weeks in May.  Has hot flashes.   Just had blood work drawn today to check TSH, FSH, estradiol, and CBC.   She is looking into [employer].   GYNECOLOGIC HISTORY: Patient's last menstrual period was 05/17/2021 (exact date). Contraception: Withdrawal Menopausal hormone therapy:  none Last mammogram:  05-04-21 3D/Neg/Birads1 Last pap smear:  06-17-21 Atypia, negative HR HPV, candida noted. 06-05-18 Neg:Neg HR HPV, 05/19/17 Pap and HR HPV neg,  05/06/16 Pap and HR HPV neg        OB History     Gravida  2   Para  1   Term  1   Preterm      AB  1   Living  1      SAB      IAB  1   Ectopic      Multiple      Live Births  1              Patient Active Problem List   Diagnosis Date Noted   Hypertension 10/10/2019   Hyperlipidemia 10/10/2019   Acute ST elevation myocardial infarction (STEMI) due to occlusion of left anterior descending (LAD) coronary artery ([facility]CC)    Atypical squamous cell changes of undetermined significance (ASCUS) on cervical cytology with positive high risk human papilloma virus (HPV) 06/06/2014    Past Medical History:  Diagnosis Date   Abnormal Pap smear of cervix    Essential hypertension    GERD (gastroesophageal reflux disease)    History of colonic polyps    History of migraine    Occasional, menustral cycle related   Hx of chlamydia infection 1989   Hyperlipidemia    STEMI (ST elevation myocardial infarction) ([place])    10/08/19 PCI/DESx1 to pLAD    Past Surgical History:  Procedure Laterality Date   BREAST BIOPSY Right    BREAST SURGERY  01/2016   Rt.breast bx--?[place]   COLONOSCOPY WITH PROPOFOL N/A 11/28/2014   Procedure: COLONOSCOPY WITH PROPOFOL;   Surgeon: Cleotis Nipper, MD;  [facility];  Service: Endoscopy;  Laterality: N/A;   CORONARY/GRAFT ACUTE MI REVASCULARIZATION N/A 10/08/2019   Procedure: Coronary/Graft Acute MI Revascularization;  Surgeon: Burnell Blanks, MD;  Location: [place] [facility];  Service: Cardiovascular;  Laterality: N/A;   Fibroid resection  01/2012   Dr. Joan Flores   HYSTEROSCOPY  01/2012   Dr. Joan Flores   LEFT HEART CATH AND CORONARY ANGIOGRAPHY N/A 10/08/2019   Procedure: LEFT HEART CATH AND CORONARY ANGIOGRAPHY;  Surgeon: Burnell Blanks, MD;  Location: [place] [facility];  Service: Cardiovascular;  Laterality: N/A;   SKIN LESION EXCISION     --actually frozen by dermatology--benign   TOE SURGERY     Toe nail removed    Current Outpatient Medications  Medication Sig Dispense Refill   atenolol (TENORMIN) 50 MG tablet Take 50 mg by mouth daily with lunch.      atorvastatin (LIPITOR) 80 MG tablet Take 1 tablet (80 mg total) by mouth at bedtime. 90 tablet 2   BAYER ASPIRIN EC LOW DOSE 81 MG EC tablet Take 1 tablet by mouth daily.     cholecalciferol (VITAMIN  D) 1000 units tablet Take 2,000 Units by mouth daily.      Cyanocobalamin (VITAMIN B-12) 2500 MCG SUBL Take 1 tablet by mouth daily.      omeprazole (PRILOSEC) 20 MG capsule Take 20 mg by mouth daily with lunch.      Probiotic Product (PROBIOTIC PO) Take by mouth.     nitroGLYCERIN (NITROSTAT) 0.4 MG SL tablet Place 1 tablet (0.4 mg total) under the tongue every 5 (five) minutes as needed for chest pain. 25 tablet 0   No current facility-administered medications for this visit.     ALLERGIES: Amoxicillin  Family History  Problem Relation Age of Onset   Cancer - Colon Father    Hypertension Father    Hyperlipidemia Father    Heart disease Father    Hyperlipidemia Mother    Macular degeneration Mother    Hypertension Brother    Heart disease Brother 62       Heart attack   Cancer - Colon Maternal Grandmother    Heart  disease Paternal Grandfather    Cancer - Colon Maternal Aunt    Cancer - Colon Maternal Uncle     Social History   Socioeconomic History   Marital status: Married    Spouse name: Not on file   Number of children: Not on file   Years of education: Not on file   Highest education level: Not on file  Occupational History   Not on file  Tobacco Use   Smoking status: Former    Packs/day: 0.00    Pack years: 0.00    Types: Cigarettes    Quit date: 10/08/2019    Years since quitting: 1.7   Smokeless tobacco: Never   Tobacco comments:    smokes 1/2 pack per year  Vaping Use   Vaping Use: Never used  Substance and Sexual Activity   Alcohol use: No    Alcohol/week: 0.0 standard drinks   Drug use: No   Sexual activity: Yes    Partners: Male    Birth control/protection: Other-see comments    Comment: withdrawal  Other Topics Concern   Not on file  Social History Narrative   Not on file   Social Determinants of Health   Financial Resource Strain: Not on file  Food Insecurity: Not on file  Transportation Needs: Not on file  Physical Activity: Not on file  Stress: Not on file  Social Connections: Not on file  Intimate Partner Violence: Not on file    Review of Systems  All other systems reviewed and are negative.  PHYSICAL EXAMINATION:    BP 138/82 (Cuff Size: Large)   Pulse 65   Ht 5' 4.5" (1.638 m)   Wt 233 lb (105.7 kg)   LMP 05/17/2021 (Exact Date)   SpO2 98%   BMI 39.38 kg/m     General appearance: alert, cooperative and appears stated age   Pelvic US Uterus 7.44 x 5.19 x 5.09 cm.  EMS 12.08 mm due to submucous fibroid 1.1 cm. Multiple fibroids noted:  1.88 cm, 2.11 cm, 1.99 cm, 0.69 cm, 2.35 cm, 1.77 cm.  Right ovary normal.  Left ovary with 2.8 cm simple cyst, no abnormal vascular flow.  No adnexal masses.  No free fluid.  ASSESSMENT  Abnormal uterine bleeding. I suspect patient is perimenopausal.  Fibroids, including small submucous fibroid.   Hx prior hysteroscopic resection of fibroid in 2013.  ASCUS pap and negative HR HPV.  PLAN  Pelvic US images and report reviewed.  We discussed fibroids and her simple ovarian cyst.  No EMB at this time.  Check FSH, estradiol, TSH and CBC today.  Next pap in 3 years.     29 min total time was spent for this patient encounter, including preparation, face-to-face counseling with the patient, coordination of care, and documentation of the encounter.

## 2021-06-22 NOTE — Patient Instructions (Signed)
Uterine Fibroids ?Uterine fibroids, also called leiomyomas, are noncancerous (benign) tumors that can grow in the uterus. They can cause heavy menstrual bleeding and pain. Fibroids may also grow in the fallopian tubes, cervix, or tissues (ligaments) near the uterus. ?You may have one or many fibroids. Fibroids vary in size, weight, and where they grow in the uterus. Some can become quite large. Most fibroids do not require medical treatment. ?What are the causes? ?The cause of this condition is not known. ?What increases the risk? ?You are more likely to develop this condition if you: ?Are in your 30s or 40s and have not gone through menopause. ?Have a family history of this condition. ?Are of African American descent. ?Started your menstrual period at age 10 or younger. ?Have never given birth. ?Are overweight or obese. ?What are the signs or symptoms? ?Many women do not have any symptoms. Symptoms of this condition may include: ?Heavy menstrual bleeding. ?Bleeding between menstrual periods. ?Pain and pressure in the pelvic area, between your hip bones. ?Pain during sex. ?Bladder problems, such as needing to urinate right away or more often than usual. ?Inability to have children (infertility). ?Failure to carry pregnancy to term (miscarriage). ?How is this diagnosed? ?This condition may be diagnosed based on: ?Your symptoms and medical history. ?A physical exam. ?A pelvic exam that includes feeling for any tumors. ?Imaging tests, such as ultrasound or MRI. ?How is this treated? ?Treatment for this condition may include follow-up visits with your health care provider to monitor your fibroids for any changes. Other treatment may include: ?Medicines, such as: ?Medicines to relieve pain, including aspirin and NSAIDs, such as ibuprofen or naproxen. ?Hormone therapy. Treatment may be given as a pill or an injection, or it may be inserted into the uterus using an intrauterine device (IUD). ?Surgery that would do one of  the following: ?Remove the fibroids (myomectomy). This may be recommended if fibroids affect your fertility and you want to become pregnant. ?Remove the uterus (hysterectomy). ?Block the blood supply to the fibroids (uterine artery embolization). This can cause them to shrink and die. ?Follow these instructions at home: ?Medicines ?Take over-the-counter and prescription medicines only as told by your health care provider. ?Ask your health care provider if you should take iron pills or eat more iron-rich foods, such as dark green, leafy vegetables. Heavy menstrual bleeding can cause low iron levels. ?Managing pain ?If directed, apply heat to your back or abdomen to reduce pain. Use the heat source that your health care provider recommends, such as a moist heat pack or a heating pad. To apply heat: ?Place a towel between your skin and the heat source. ?Leave the heat on for 20-30 minutes. ?Remove the heat if your skin turns bright red. This is especially important if you are unable to feel pain, heat, or cold. You may have a greater risk of getting burned. ? ?General instructions ?Pay close attention to your menstrual cycle. Tell your health care provider about any changes, such as: ?Heavier bleeding that requires you to change your pads or tampons more than usual. ?A change in the number of days that your menstrual period lasts. ?A change in symptoms that come with your menstrual period, such as back pain or cramps in your abdomen. ?Keep all follow-up visits. This is important, especially if your fibroids need to be monitored for any changes. ?Contact a health care provider if you: ?Have pelvic pain, back pain, or cramps in your abdomen that do not get better with medicine   or heat. ?Develop new bleeding between menstrual periods. ?Have increased bleeding during or between menstrual periods. ?Feel more tired or weak than usual. ?Feel light-headed. ?Get help right away if you: ?Faint. ?Have pelvic pain that suddenly  gets worse. ?Have severe vaginal bleeding that soaks a tampon or pad in 30 minutes or less. ?Summary ?Uterine fibroids are noncancerous (benign) tumors that can develop in the uterus. ?The exact cause of this condition is not known. ?Most fibroids do not require medical treatment unless they affect your ability to have children (fertility). ?Contact a health care provider if you have pelvic pain, back pain, or cramps in your abdomen that do not get better with medicines. ?Get help right away if you faint, have pelvic pain that suddenly gets worse, or have severe vaginal bleeding. ?This information is not intended to replace advice given to you by your health care provider. Make sure you discuss any questions you have with your health care provider. ?Document Revised: 07/14/2020 Document Reviewed: 07/14/2020 ?Elsevier Patient Education ? 2022 [employer] ? ?

## 2021-06-23 LAB — CBC
HCT: 39.7 % (ref 35.0–45.0)
Hemoglobin: 12.5 g/dL (ref 11.7–15.5)
MCH: 24.9 pg — ABNORMAL LOW (ref 27.0–33.0)
MCHC: 31.5 g/dL — ABNORMAL LOW (ref 32.0–36.0)
MCV: 78.9 fL — ABNORMAL LOW (ref 80.0–100.0)
MPV: 11.9 fL (ref 7.5–12.5)
Platelets: 568 10*3/uL — ABNORMAL HIGH (ref 140–400)
RBC: 5.03 10*6/uL (ref 3.80–5.10)
RDW: 14.8 % (ref 11.0–15.0)
WBC: 9.6 10*3/uL (ref 3.8–10.8)

## 2021-06-23 LAB — TSH: TSH: 2.12 mIU/L

## 2021-06-23 LAB — ESTRADIOL: Estradiol: 37 pg/mL

## 2021-06-23 LAB — FOLLICLE STIMULATING HORMONE: FSH: 58.6 m[IU]/mL

## 2021-11-09 ENCOUNTER — Other Ambulatory Visit: Payer: Self-pay

## 2021-11-09 ENCOUNTER — Ambulatory Visit (INDEPENDENT_AMBULATORY_CARE_PROVIDER_SITE_OTHER): Payer: Commercial Managed Care - PPO | Admitting: Obstetrics and Gynecology

## 2021-11-09 ENCOUNTER — Encounter: Payer: Self-pay | Admitting: Obstetrics and Gynecology

## 2021-11-09 VITALS — BP 130/82 | HR 65 | Ht 64.0 in | Wt 229.0 lb

## 2021-11-09 DIAGNOSIS — Z01419 Encounter for gynecological examination (general) (routine) without abnormal findings: Secondary | ICD-10-CM | POA: Diagnosis not present

## 2021-11-09 NOTE — Progress Notes (Signed)
55 y.o. G80P1011 Married Caucasian female here for annual exam.    Patient complaining of hot flashes. Managing ok.   Estradiol 37 and FSH 58.6 on 6/28//22.   PCP:  London Pepper, MD   Patient's last menstrual period was 05/26/2021 (approximate).           Sexually active: Yes.    The current method of family planning is coitus interruptus.    Exercising: Yes.     [place] Smoker:  no  Health Maintenance: Pap:   06-17-21 ASCUS:Neg HR HPV, 06-05-18 Neg:Neg HR HPV,  05/19/17 Pap and HR HPV neg History of abnormal Pap:  Yes,  colpo 05-22-15 LGSIL--stable;no txment--rpt pap HPV 84yr 05-01-15- ASCUS:Pos HR HPV. 06-06-14 colposcopy--LSIL; Hx of possible cryotherapy or conization of cervix 1998 MMG: 05-04-21 3D/Neg/BiRads1 Colonoscopy:  11/2014 polyp;due now--she knows to call and schedule BMD:   n/a  Result  n/a TDaP:  PCP Gardasil:   no HIV: Neg years ago Hep C: Neg years ago Screening Labs:  PCP. Flu vaccine:  completed with PCP.     reports that she quit smoking about 2 years ago. Her smoking use included cigarettes. She has never used smokeless tobacco. She reports that she does not drink alcohol and does not use drugs.  Past Medical History:  Diagnosis Date   Abnormal Pap smear of cervix    Essential hypertension    GERD (gastroesophageal reflux disease)    History of colonic polyps    History of migraine    Occasional, menustral cycle related   Hx of chlamydia infection 1989   Hyperlipidemia    STEMI (ST elevation myocardial infarction) [place]    10/08/19 PCI/DESx1 to pLAD    Past Surgical History:  Procedure Laterality Date   BREAST BIOPSY Right    BREAST SURGERY  01/2016   [place]  COLONOSCOPY WITH PROPOFOL N/A 11/28/2014   Procedure: COLONOSCOPY WITH PROPOFOL;  Surgeon: RCleotis Nipper MD;  Location: [facility];  Service: Endoscopy;  Laterality: N/A;   CORONARY/GRAFT ACUTE MI REVASCULARIZATION N/A 10/08/2019   Procedure: Coronary/Graft Acute MI  Revascularization;  Surgeon: MBurnell Blanks MD;  [facility];  Service: Cardiovascular;  Laterality: N/A;   Fibroid resection  01/2012   Dr. RJoan Flores  HYSTEROSCOPY  01/2012   Dr. RJoan Flores  LEFT HEART CATH AND CORONARY ANGIOGRAPHY N/A 10/08/2019   Procedure: LEFT HEART CATH AND CORONARY ANGIOGRAPHY;  Surgeon: MBurnell Blanks MD;  [facility];  Service: Cardiovascular;  Laterality: N/A;   SKIN LESION EXCISION     --actually frozen by dermatology--benign   TOE SURGERY     Toe nail removed    Current Outpatient Medications  Medication Sig Dispense Refill   atenolol (TENORMIN) 50 MG tablet Take 50 mg by mouth daily with lunch.      atorvastatin (LIPITOR) 80 MG tablet Take 1 tablet (80 mg total) by mouth at bedtime. 90 tablet 2   BAYER ASPIRIN EC LOW DOSE 81 MG EC tablet Take 1 tablet by mouth daily.     cholecalciferol (VITAMIN D) 1000 units tablet Take 2,000 Units by mouth daily.      Cyanocobalamin (VITAMIN B-12) 2500 MCG SUBL Take 1 tablet by mouth daily.      ketoconazole (NIZORAL) 2 % shampoo Apply topically.     omeprazole (PRILOSEC) 20 MG capsule Take 20 mg by mouth daily with lunch.      Probiotic Product (PROBIOTIC PO) Take by  mouth.     nitroGLYCERIN (NITROSTAT) 0.4 MG SL tablet Place 1 tablet (0.4 mg total) under the tongue every 5 (five) minutes as needed for chest pain. 25 tablet 0   No current facility-administered medications for this visit.    Family History  Problem Relation Age of Onset   Cancer Mother        multiple myeloma   Hyperlipidemia Mother    Macular degeneration Mother    Cancer - Colon Father    Hypertension Father    Hyperlipidemia Father    Heart disease Father    Hypertension Brother    Heart disease Brother 53       Heart attack   Cancer - Colon Maternal Aunt    Cancer - Colon Maternal Uncle    Cancer - Colon Maternal Grandmother    Heart disease Paternal Grandfather     Review of Systems   All other systems reviewed and are negative.  Exam:   BP 130/82   Pulse 65   Ht _0  (1.626 m)   Wt 229 lb (103.9 kg)   LMP 05/26/2021 (Approximate)   SpO2 97%   BMI 39.31 kg/m     General appearance: alert, cooperative and appears stated age Head: normocephalic, without obvious abnormality, atraumatic Neck: no adenopathy, supple, symmetrical, trachea midline and thyroid normal to inspection and palpation Lungs: clear to auscultation bilaterally Breasts: normal appearance, no masses or tenderness, No nipple retraction or dimpling, No nipple discharge or bleeding, No axillary adenopathy Heart: regular rate and rhythm Abdomen: soft, non-tender; no masses, no organomegaly Extremities: extremities normal, atraumatic, no cyanosis or edema Skin: skin color, texture, turgor normal. No rashes or lesions Lymph nodes: cervical, supraclavicular, and axillary nodes normal. Neurologic: grossly normal  Pelvic: External genitalia:  no lesions              No abnormal inguinal nodes palpated.              Urethra:  normal appearing urethra with no masses, tenderness or lesions              Bartholins and Skenes: normal                 Vagina: normal appearing vagina with normal color and discharge,  4 mm left vaginal apical skin tag versus polyp.               Cervix: no lesions              Pap taken: no Bimanual Exam:  Uterus:  normal size, contour, position, consistency, mobility, non-tender              Adnexa: no mass, fullness, tenderness              Rectal exam: yes.  Confirms.              Anus:  normal sphincter tone, no lesions  Chaperone was present for exam:  Estill Bamberg, CMA  Assessment:   Well woman visit with gynecologic exam. Perimenopausal female.  Fibroids, including small submucous fibroid.  Hx prior hysteroscopic resection of fibroid in 2013.  ASCUS pap and negative HR HPV. FH colon cancer.  Hx MI.  Has left vaginal apical polyp.    Plan: Mammogram screening  discussed. Self breast awareness reviewed. Pap and HR HPV 2025. Guidelines for Calcium, Vitamin D, regular exercise program including cardiovascular and weight bearing exercise. Labs with PCP.  We discussed bivalent booster.  She will schedule  her colonoscopy.  Follow up annually and prn.   After visit summary provided.

## 2021-11-09 NOTE — Patient Instructions (Signed)

## 2021-11-15 ENCOUNTER — Other Ambulatory Visit: Payer: Self-pay | Admitting: Cardiology

## 2021-11-18 ENCOUNTER — Other Ambulatory Visit: Payer: Self-pay | Admitting: Cardiology

## 2021-11-28 ENCOUNTER — Other Ambulatory Visit: Payer: Self-pay | Admitting: Cardiology

## 2022-01-23 ENCOUNTER — Other Ambulatory Visit: Payer: Self-pay | Admitting: Cardiology

## 2022-02-26 ENCOUNTER — Other Ambulatory Visit: Payer: Self-pay | Admitting: Cardiology

## 2022-05-26 DIAGNOSIS — D45 Polycythemia vera: Secondary | ICD-10-CM

## 2022-05-26 HISTORY — DX: Polycythemia vera: D45

## 2022-05-30 ENCOUNTER — Telehealth: Payer: Self-pay | Admitting: *Deleted

## 2022-05-30 NOTE — Telephone Encounter (Signed)
Patient called she was told she is perimenopausal. Called to report bleeding x 1 month now, states the flow varies today she changed 3-4 pads until 1:00pm from our conversation. She states at times she had to change pads every hour, passing clots and cramping. I advised her to schedule an office visit for exam and plan care, patient said last time she has bleeding no Rx was prescribed. Patient asked me to check with you and see how you would like her to proceed? Please advise

## 2022-05-30 NOTE — Telephone Encounter (Signed)
Please schedule office visit with me.  

## 2022-05-31 ENCOUNTER — Encounter: Payer: Self-pay | Admitting: Obstetrics and Gynecology

## 2022-05-31 ENCOUNTER — Ambulatory Visit: Payer: Commercial Managed Care - PPO | Admitting: Obstetrics and Gynecology

## 2022-05-31 VITALS — BP 130/82 | HR 69 | Ht 64.5 in | Wt 225.0 lb

## 2022-05-31 DIAGNOSIS — D219 Benign neoplasm of connective and other soft tissue, unspecified: Secondary | ICD-10-CM

## 2022-05-31 DIAGNOSIS — N924 Excessive bleeding in the premenopausal period: Secondary | ICD-10-CM

## 2022-05-31 LAB — PREGNANCY, URINE: Preg Test, Ur: NEGATIVE

## 2022-05-31 MED ORDER — MEDROXYPROGESTERONE ACETATE 10 MG PO TABS: 10.0000 mg | ORAL_TABLET | Freq: Every day | ORAL | 0 refills | Status: DC

## 2022-05-31 NOTE — Progress Notes (Signed)
GYNECOLOGY  VISIT   HPI: 56 y.o.   Married  Caucasian  female   [id number] with No LMP recorded.   here for vaginal bleeding x1 month. Patient went almost one year without menstrual bleeding.   Patient states that her vaginal bleeding never completely stops.  It is heavy 2 - 3 days a week.  Can change a pad every hour or less.  Using a towel at night.   Feeling tired.   Some cramping.  Spotting 2 - 3 occasions since November, 2022 office visit.  FSH 58.6 and estradiol 37 on 06/22/21.  Patient has known fibroids.   GYNECOLOGIC HISTORY: No LMP recorded. Contraception:  coitus interruptus Menopausal hormone therapy:  none Last mammogram:   05-04-21 3D/Neg/BiRads1 Last pap smear:   06-17-21 ASCUS:Neg HR HPV, 06-05-18 Neg:Neg HR HPV,  05/19/17 Pap and HR HPV neg        OB History     Gravida  2   Para  1   Term  1   Preterm      AB  1   Living  1      SAB      IAB  1   Ectopic      Multiple      Live Births  1              Patient Active Problem List   Diagnosis Date Noted   Hypertension 10/10/2019   Hyperlipidemia 10/10/2019   Acute ST elevation myocardial infarction (STEMI) due to occlusion of left anterior descending (LAD) coronary artery (HCC)    Atypical squamous cell changes of undetermined significance (ASCUS) on cervical cytology with positive high risk human papilloma virus (HPV) 06/06/2014    Past Medical History:  Diagnosis Date   Abnormal Pap smear of cervix    Essential hypertension    GERD (gastroesophageal reflux disease)    History of colonic polyps    History of migraine    Occasional, menustral cycle related   Hx of chlamydia infection 1989   Hyperlipidemia    STEMI (ST elevation myocardial infarction) ([place])    10/08/19 PCI/DESx1 to pLAD    Past Surgical History:  Procedure Laterality Date   BREAST BIOPSY Right    BREAST SURGERY  01/2016   Rt.breast bx--?[place]   COLONOSCOPY WITH PROPOFOL N/A 11/28/2014   Procedure: COLONOSCOPY  WITH PROPOFOL;  Surgeon: Cleotis Nipper, MD;  [facility]: [facility];  Service: Endoscopy;  Laterality: N/A;   CORONARY/GRAFT ACUTE MI REVASCULARIZATION N/A 10/08/2019   Procedure: Coronary/Graft Acute MI Revascularization;  Surgeon: Burnell Blanks, MD;  Location: [facility];  Service: Cardiovascular;  Laterality: N/A;   Fibroid resection  01/2012   Dr. Joan Flores   HYSTEROSCOPY  01/2012   Dr. Joan Flores   LEFT HEART CATH AND CORONARY ANGIOGRAPHY N/A 10/08/2019   Procedure: LEFT HEART CATH AND CORONARY ANGIOGRAPHY;  Surgeon: Burnell Blanks, MD;  Location: [facility];  Service: Cardiovascular;  Laterality: N/A;   SKIN LESION EXCISION     --actually frozen by dermatology--benign   TOE SURGERY     Toe nail removed    Current Outpatient Medications  Medication Sig Dispense Refill   atenolol (TENORMIN) 50 MG tablet Take 50 mg by mouth daily with lunch.      atorvastatin (LIPITOR) 80 MG tablet TAKE 1 TABLET BY MOUTH AT BEDTIME . APPOINTMENT REQUIRED FOR FUTURE REFILLS (OVERDUE FROM 03/2021) 30 tablet 0   BAYER ASPIRIN EC LOW DOSE 81  MG EC tablet Take 1 tablet by mouth daily.     cholecalciferol (VITAMIN D) 1000 units tablet Take 2,000 Units by mouth daily.      Cyanocobalamin (VITAMIN B-12) 2500 MCG SUBL Take 1 tablet by mouth daily.      ketoconazole (NIZORAL) 2 % shampoo Apply topically.     omeprazole (PRILOSEC) 20 MG capsule Take 20 mg by mouth daily with lunch.      nitroGLYCERIN (NITROSTAT) 0.4 MG SL tablet Place 1 tablet (0.4 mg total) under the tongue every 5 (five) minutes as needed for chest pain. 25 tablet 0   No current facility-administered medications for this visit.     ALLERGIES: Amoxicillin  Family History  Problem Relation Age of Onset   Cancer Mother        multiple myeloma   Hyperlipidemia Mother    Macular degeneration Mother    Cancer - Colon Father    Hypertension Father    Hyperlipidemia Father    Heart disease Father    Hypertension  Brother    Heart disease Brother 18       Heart attack   Cancer - Colon Maternal Aunt    Cancer - Colon Maternal Uncle    Cancer - Colon Maternal Grandmother    Heart disease Paternal Grandfather     Social History   Socioeconomic History   Marital status: Married    Spouse name: Not on file   Number of children: Not on file   Years of education: Not on file   Highest education level: Not on file  Occupational History   Not on file  Tobacco Use   Smoking status: Former    Packs/day: 0.00    Types: Cigarettes    Quit date: 10/08/2019    Years since quitting: 2.6   Smokeless tobacco: Never   Tobacco comments:    smokes 1/2 pack per year  Vaping Use   Vaping Use: Never used  Substance and Sexual Activity   Alcohol use: No    Alcohol/week: 0.0 standard drinks   Drug use: No   Sexual activity: Yes    Partners: Male    Birth control/protection: Other-see comments    Comment: withdrawal  Other Topics Concern   Not on file  Social History Narrative   Not on file   Social Determinants of Health   Financial Resource Strain: Not on file  Food Insecurity: Not on file  Transportation Needs: Not on file  Physical Activity: Not on file  Stress: Not on file  Social Connections: Not on file  Intimate Partner Violence: Not on file    Review of Systems  Genitourinary:  Positive for vaginal bleeding.  All other systems reviewed and are negative.  PHYSICAL EXAMINATION:    BP 130/82   Pulse 69   Ht 5' 4.5" (1.638 m)   Wt 225 lb (102.1 kg)   SpO2 97%   BMI 38.02 kg/m     General appearance: alert, cooperative and appears stated age   Pelvic: External genitalia:  no lesions              Urethra:  normal appearing urethra with no masses, tenderness or lesions              Bartholins and Skenes: normal                 Vagina: normal appearing vagina with normal color and discharge, no lesions  Cervix: no lesions.  Clot coming from os.                  Bimanual Exam:  Uterus:  normal size, contour, position, consistency, mobility, non-tender              Adnexa: no mass, fullness, tenderness               Chaperone was present for exam:  Estill Bamberg, CMA  ASSESSMENT  Abnormal perimenopausal bleeding.  Fibroids.  Hx MI 2020.  PLAN  UPT.  CBC, FSH, and estradiol.  Provera 10 mg x 10 days.   I discussed potential effect of thromboembolism.  Return for pelvic US and possible endometrial biopsy.  Rationale explained.  An After Visit Summary was printed and given to the patient.  28 min  total time was spent for this patient encounter, including preparation, face-to-face counseling with the patient, coordination of care, and documentation of the encounter.

## 2022-05-31 NOTE — Telephone Encounter (Signed)
Office visit scheduled on 05/31/22

## 2022-05-31 NOTE — Telephone Encounter (Signed)
Message sent to appointments to schedule. 

## 2022-05-31 NOTE — Progress Notes (Unsigned)
GYNECOLOGY  VISIT   HPI: 56 y.o.   Married  Caucasian  female   [id number] with No LMP recorded.   here for pelvic ultrasound and possible EMB. Seen yesterday in office for vaginal bleeding of one month duration.  Bleeding can be heavy.  Has known fibroids.   Estradiol 45 and FSH 22.4 on 05/31/22.  WBC 11.5, Hgb 12.3, platelets 682,000. Elevated platelets on chart review.   Started Provera 10 mg x 10 days.  Took one pill yesterday and the bleeding has essentially stopped.   GYNECOLOGIC HISTORY: No LMP recorded. Contraception:  coitus interruptus Menopausal hormone therapy:  none Last mammogram:   05-04-21 3D/Neg/BiRads1 Last pap smear:  06-17-21 ASCUS:Neg HR HPV, 06-05-18 Neg:Neg HR HPV,  05/19/17 Pap and HR HPV neg        OB History     Gravida  2   Para  1   Term  1   Preterm      AB  1   Living  1      SAB      IAB  1   Ectopic      Multiple      Live Births  1              Patient Active Problem List   Diagnosis Date Noted   Hypertension 10/10/2019   Hyperlipidemia 10/10/2019   Acute ST elevation myocardial infarction (STEMI) due to occlusion of left anterior descending (LAD) coronary artery (HCC)    Atypical squamous cell changes of undetermined significance (ASCUS) on cervical cytology with positive high risk human papilloma virus (HPV) 06/06/2014    Past Medical History:  Diagnosis Date   Abnormal Pap smear of cervix    Essential hypertension    GERD (gastroesophageal reflux disease)    History of colonic polyps    History of migraine    Occasional, menustral cycle related   Hx of chlamydia infection 1989   Hyperlipidemia    STEMI (ST elevation myocardial infarction) ([place])    10/08/19 PCI/DESx1 to pLAD    Past Surgical History:  Procedure Laterality Date   BREAST BIOPSY Right    BREAST SURGERY  01/2016   Rt.breast bx--?[place]   COLONOSCOPY WITH PROPOFOL N/A 11/28/2014   Procedure: COLONOSCOPY WITH PROPOFOL;  Surgeon: Cleotis Nipper, MD;   Location: [facility];  Service: Endoscopy;  Laterality: N/A;   CORONARY/GRAFT ACUTE MI REVASCULARIZATION N/A 10/08/2019   Procedure: Coronary/Graft Acute MI Revascularization;  Surgeon: Burnell Blanks, MD;  Location: [place] [facility];  Service: Cardiovascular;  Laterality: N/A;   Fibroid resection  01/2012   Dr. Joan Flores   HYSTEROSCOPY  01/2012   Dr. Joan Flores   LEFT HEART CATH AND CORONARY ANGIOGRAPHY N/A 10/08/2019   Procedure: LEFT HEART CATH AND CORONARY ANGIOGRAPHY;  Surgeon: Burnell Blanks, MD;  Location: [facility];  Service: Cardiovascular;  Laterality: N/A;   SKIN LESION EXCISION     --actually frozen by dermatology--benign   TOE SURGERY     Toe nail removed    Current Outpatient Medications  Medication Sig Dispense Refill   atenolol (TENORMIN) 50 MG tablet Take 50 mg by mouth daily with lunch.      atorvastatin (LIPITOR) 80 MG tablet TAKE 1 TABLET BY MOUTH AT BEDTIME . APPOINTMENT REQUIRED FOR FUTURE REFILLS (OVERDUE FROM 03/2021) 30 tablet 0   BAYER ASPIRIN EC LOW DOSE 81 MG EC tablet Take 1 tablet by mouth daily.     cholecalciferol (VITAMIN  D) 1000 units tablet Take 2,000 Units by mouth daily.      Cyanocobalamin (VITAMIN B-12) 2500 MCG SUBL Take 1 tablet by mouth daily.      ketoconazole (NIZORAL) 2 % shampoo Apply topically.     medroxyPROGESTERone (PROVERA) 10 MG tablet Take 1 tablet (10 mg total) by mouth daily. 10 tablet 0   nitroGLYCERIN (NITROSTAT) 0.4 MG SL tablet Place 1 tablet (0.4 mg total) under the tongue every 5 (five) minutes as needed for chest pain. 25 tablet 0   omeprazole (PRILOSEC) 20 MG capsule Take 20 mg by mouth daily with lunch.      No current facility-administered medications for this visit.     ALLERGIES: Amoxicillin  Family History  Problem Relation Age of Onset   Cancer Mother        multiple myeloma   Hyperlipidemia Mother    Macular degeneration Mother    Cancer - Colon Father    Hypertension Father     Hyperlipidemia Father    Heart disease Father    Hypertension Brother    Heart disease Brother 41       Heart attack   Cancer - Colon Maternal Aunt    Cancer - Colon Maternal Uncle    Cancer - Colon Maternal Grandmother    Heart disease Paternal Grandfather     Social History   Socioeconomic History   Marital status: Married    Spouse name: Not on file   Number of children: Not on file   Years of education: Not on file   Highest education level: Not on file  Occupational History   Not on file  Tobacco Use   Smoking status: Former    Packs/day: 0.00    Types: Cigarettes    Quit date: 10/08/2019    Years since quitting: 2.6   Smokeless tobacco: Never   Tobacco comments:    smokes 1/2 pack per year  Vaping Use   Vaping Use: Never used  Substance and Sexual Activity   Alcohol use: No    Alcohol/week: 0.0 standard drinks of alcohol   Drug use: No   Sexual activity: Yes    Partners: Male    Birth control/protection: Other-see comments    Comment: withdrawal  Other Topics Concern   Not on file  Social History Narrative   Not on file   Social Determinants of Health   Financial Resource Strain: Not on file  Food Insecurity: Not on file  Transportation Needs: Not on file  Physical Activity: Not on file  Stress: Not on file  Social Connections: Not on file  Intimate Partner Violence: Not on file    Review of Systems  All other systems reviewed and are negative.   PHYSICAL EXAMINATION:    BP 140/82   Ht 5' 4.5" (1.638 m)   Wt 225 lb (102.1 kg)   BMI 38.02 kg/m     General appearance: alert, cooperative and appears stated age   Pelvic US Uterus 8.53 x 7.83 x 5.57 cm. Multiple fibroids:  1.93 cm, 1.40 cm, 1.76 cm, 1.06 cm, 1.19 cm, 1.26 cm.  EMS 11.96 mm.  Echogenic mass 16 x 14 mm with feeder vessel, separate from submucous fibroid.  Left ovary 2.66 x 2.08 x 2.01 cm.  Follicle 16 x 15 mm.  Right ovary 2.58 x 1.49 x 1.83 cm.  No adnexal mass.  No free  fluid.             EMB Consent done.  EMB to 8 cm after Hibiclens prep.  Tissue to pathology.  No complications.  Minimal EBL.  Chaperone was present for exam:  Estill Bamberg, CMA.   ASSESSMENT  Menorrhagia with irregular menses. Thickened endometrium.  Submucous fibroid and suspected polyp. Thrombocytosis.  Hx MI  PLAN  Pelvic US images and findings reviewed.  FU EMB. I anticipate a hysteroscopy with dilation and curettage.  Complete 10 day course of Provera. Referral to Dr. Dorris Carnes, hematology/oncology at [facility].    An After Visit Summary was printed and given to the patient.  20 min  total time was spent for this patient encounter, including preparation, face-to-face counseling with the patient, coordination of care, and documentation of the encounter in addition to doing the endometrial biopsy.

## 2022-06-01 LAB — CBC
HCT: 41.6 % (ref 35.0–45.0)
Hemoglobin: 12.3 g/dL (ref 11.7–15.5)
MCH: 22.4 pg — ABNORMAL LOW (ref 27.0–33.0)
MCHC: 29.6 g/dL — ABNORMAL LOW (ref 32.0–36.0)
MCV: 75.6 fL — ABNORMAL LOW (ref 80.0–100.0)
MPV: 12 fL (ref 7.5–12.5)
Platelets: 682 10*3/uL — ABNORMAL HIGH (ref 140–400)
RBC: 5.5 10*6/uL — ABNORMAL HIGH (ref 3.80–5.10)
RDW: 16.2 % — ABNORMAL HIGH (ref 11.0–15.0)
WBC: 11.5 10*3/uL — ABNORMAL HIGH (ref 3.8–10.8)

## 2022-06-01 LAB — ESTRADIOL: Estradiol: 45 pg/mL

## 2022-06-01 LAB — FOLLICLE STIMULATING HORMONE: FSH: 22.4 m[IU]/mL

## 2022-06-02 ENCOUNTER — Ambulatory Visit (INDEPENDENT_AMBULATORY_CARE_PROVIDER_SITE_OTHER): Payer: Commercial Managed Care - PPO | Admitting: Obstetrics and Gynecology

## 2022-06-02 ENCOUNTER — Ambulatory Visit: Payer: Commercial Managed Care - PPO

## 2022-06-02 ENCOUNTER — Encounter: Payer: Self-pay | Admitting: Obstetrics and Gynecology

## 2022-06-02 ENCOUNTER — Other Ambulatory Visit (HOSPITAL_COMMUNITY)
Admission: RE | Admit: 2022-06-02 | Discharge: 2022-06-02 | Disposition: A | Discharge status: Home / Self Care | Payer: Commercial Managed Care - PPO | Source: Ambulatory Visit | Attending: Obstetrics and Gynecology | Admitting: Obstetrics and Gynecology

## 2022-06-02 VITALS — BP 140/82 | Ht 64.5 in | Wt 225.0 lb

## 2022-06-02 DIAGNOSIS — R9389 Abnormal findings on diagnostic imaging of other specified body structures: Secondary | ICD-10-CM

## 2022-06-02 DIAGNOSIS — N924 Excessive bleeding in the premenopausal period: Secondary | ICD-10-CM | POA: Diagnosis not present

## 2022-06-02 DIAGNOSIS — D75839 Thrombocytosis, unspecified: Secondary | ICD-10-CM

## 2022-06-02 DIAGNOSIS — N921 Excessive and frequent menstruation with irregular cycle: Secondary | ICD-10-CM | POA: Insufficient documentation

## 2022-06-02 NOTE — Patient Instructions (Signed)
Endometrial Biopsy  An endometrial biopsy is a procedure to remove tissue samples from the endometrium, which is the lining of the uterus. The tissue that is removed can then be checked under a microscope for disease. This procedure is used to diagnose conditions such as endometrial cancer, endometrial tuberculosis, polyps, or other inflammatory conditions. This procedure may also be used to investigate uterine bleeding to determine where you are in your menstrual cycle or how your hormone levels are affecting the lining of the uterus. Tell a health care provider about: Any allergies you have. All medicines you are taking, including vitamins, herbs, eye drops, creams, and over-the-counter medicines. Any problems you or family members have had with anesthetic medicines. Any blood disorders you have. Any surgeries you have had. Any medical conditions you have. Whether you are pregnant or may be pregnant. What are the risks? Generally, this is a safe procedure. However, problems may occur, including: Bleeding. Pelvic infection. Puncture of the wall of the uterus with the biopsy device (rare). Allergic reactions to medicines. What happens before the procedure? Keep a record of your menstrual cycles as told by your health care provider. You may need to schedule your procedure for a specific time in your cycle. You may want to bring a sanitary pad to wear after the procedure. Plan to have someone take you home from the hospital or clinic. Ask your health care provider about: Changing or stopping your regular medicines. This is especially important if you are taking diabetes medicines, arthritis medicines, or blood thinners. Taking medicines such as aspirin and ibuprofen. These medicines can thin your blood. Do not take these medicines unless your health care provider tells you to take them. Taking over-the-counter medicines, vitamins, herbs, and supplements. What happens during the  procedure? You will lie on an exam table with your feet and legs supported as in a pelvic exam. Your health care provider will insert an instrument (speculum) into your vagina to see your cervix. Your cervix will be cleansed with an antiseptic solution. A medicine (local anesthetic) will be used to numb the cervix. A forceps instrument (tenaculum) will be used to hold your cervix steady for the biopsy. A thin, rod-like instrument (uterine sound) will be inserted through your cervix to determine the length of your uterus and the location where the biopsy sample will be removed. A thin, flexible tube (catheter) will be inserted through your cervix and into the uterus. The catheter will be used to collect the biopsy sample from your endometrial tissue. The catheter and speculum will then be removed, and the tissue sample will be sent to a lab for examination. The procedure may vary among health care providers and hospitals. What can I expect after procedure? You will rest in a recovery area until you are ready to go home. You may have mild cramping and a small amount of vaginal bleeding. This is normal. You may have a small amount of vaginal bleeding for a few days. This is normal. It is up to you to get the results of your procedure. Ask your health care provider, or the department that is doing the procedure, when your results will be ready. Follow these instructions at home: Take over-the-counter and prescription medicines only as told by your health care provider. Do not douche, use tampons, or have sexual intercourse until your health care provider approves. Return to your normal activities as told by your health care provider. Ask your health care provider what activities are safe for you. Follow   instructions from your health care provider about any activity restrictions, such as restrictions on strenuous exercise or heavy lifting. Keep all follow-up visits. This is important. Contact a  health care provider: You have heavy bleeding, or bleed for longer than 2 days after the procedure. You have bad smelling discharge from your vagina. You have a fever or chills. You have a burning sensation when urinating or you have difficulty urinating. You have severe pain in your lower abdomen. Get help right away if you: You have severe cramps in your stomach or back. You pass large blood clots. Your bleeding increases. You become weak or light-headed, or you faint or lose consciousness. Summary An endometrial biopsy is a procedure to remove tissue samples is taken from the endometrium, which is the lining of the uterus. The tissue sample that is removed will be checked under a microscope for disease. This procedure is used to diagnose conditions such as endometrial cancer, endometrial tuberculosis, polyps, or other inflammatory conditions. After the procedure, it is common to have mild cramping and a small amount of vaginal bleeding for a few days. Do not douche, use tampons, or have sexual intercourse until your health care provider approves. Ask your health care provider which activities are safe for you. This information is not intended to replace advice given to you by your health care provider. Make sure you discuss any questions you have with your health care provider. Document Revised: 11/08/2021 Document Reviewed: 07/06/2020 Elsevier Patient Education  2023 Elsevier Inc.  

## 2022-06-06 LAB — SURGICAL PATHOLOGY

## 2022-06-07 ENCOUNTER — Telehealth: Payer: Self-pay | Admitting: Obstetrics and Gynecology

## 2022-06-07 NOTE — Telephone Encounter (Signed)
Please precert and schedule a hysteroscopy with Myosure resection of submucous fibroid and other endometrial mass, dilation and curettage at [facility].   Diagnoses are: abnormal uterine bleeding, submucous fibroid, endometrial mass.   Time needed 1 hour 30 minutes.   Preop needed.

## 2022-06-08 NOTE — Telephone Encounter (Signed)
Left message to call Jamorian Dimaria, RN at [facility], [phone], OPT 5.  

## 2022-06-09 NOTE — Telephone Encounter (Signed)
I recommend completing the course of Provera, and then letting her have the withdrawal bleed.

## 2022-06-09 NOTE — Telephone Encounter (Signed)
Spoke with patient. Reviewed surgery dates. Patient request to proceed with surgery on 07/05/22.  Advised patient I will forward to business office for return call. I will return call once surgery date and time confirmed. Patient verbalizes understanding and is agreeable.   Surgery request sent.    Patient reports she started Provera '10mg'$  x10 days, she will take the last pill tonight. Bleeding initially stopped for 2 days, restarted on 3rd day. Bleeding is lighter, changing pad q 2 hours, does not have to get up in the middle of the night to change pad. Denies any other GYn symptoms. Patient is asking if any additional recommendations since the bleeding has not stopped and she is taking the last pill? Patient is aware there may be withdrawal bleed after completing course of provera.   Advised patient I will forward to Dr. Quincy Simmonds for recommendations.   Dr. Quincy Simmonds -please advise.

## 2022-06-10 NOTE — Telephone Encounter (Signed)
Spoke with patient. Surgery date request confirmed. Advised as seen below per Dr. Quincy Simmonds. Advised surgery is scheduled for 07/05/22, [facility] at 0915.  Surgery instruction sheet and hospital brochure reviewed, printed copy will be mailed.  Patient verbalizes understanding and is agreeable.   Routing to [employer].

## 2022-06-13 ENCOUNTER — Encounter (HOSPITAL_COMMUNITY): Payer: Self-pay | Admitting: *Deleted

## 2022-06-13 ENCOUNTER — Inpatient Hospital Stay (HOSPITAL_COMMUNITY): Payer: Commercial Managed Care - PPO

## 2022-06-13 ENCOUNTER — Inpatient Hospital Stay (HOSPITAL_COMMUNITY): Payer: Commercial Managed Care - PPO | Attending: Hematology | Admitting: Hematology

## 2022-06-13 DIAGNOSIS — I10 Essential (primary) hypertension: Secondary | ICD-10-CM | POA: Diagnosis not present

## 2022-06-13 DIAGNOSIS — N921 Excessive and frequent menstruation with irregular cycle: Secondary | ICD-10-CM | POA: Diagnosis not present

## 2022-06-13 DIAGNOSIS — Z800 Family history of malignant neoplasm of digestive organs: Secondary | ICD-10-CM | POA: Insufficient documentation

## 2022-06-13 DIAGNOSIS — Z7982 Long term (current) use of aspirin: Secondary | ICD-10-CM | POA: Insufficient documentation

## 2022-06-13 DIAGNOSIS — D75839 Thrombocytosis, unspecified: Secondary | ICD-10-CM | POA: Diagnosis present

## 2022-06-13 DIAGNOSIS — Z807 Family history of other malignant neoplasms of lymphoid, hematopoietic and related tissues: Secondary | ICD-10-CM | POA: Insufficient documentation

## 2022-06-13 DIAGNOSIS — Z87891 Personal history of nicotine dependence: Secondary | ICD-10-CM | POA: Insufficient documentation

## 2022-06-13 LAB — IRON AND TIBC
Iron: 20 ug/dL — ABNORMAL LOW (ref 28–170)
Saturation Ratios: 5 % — ABNORMAL LOW (ref 10.4–31.8)
TIBC: 403 ug/dL (ref 250–450)
UIBC: 383 ug/dL

## 2022-06-13 LAB — CBC WITH DIFFERENTIAL/PLATELET
Abs Immature Granulocytes: 0.03 10*3/uL (ref 0.00–0.07)
Basophils Absolute: 0.1 10*3/uL (ref 0.0–0.1)
Basophils Relative: 1 %
Eosinophils Absolute: 0.2 10*3/uL (ref 0.0–0.5)
Eosinophils Relative: 2 %
HCT: 35.7 % — ABNORMAL LOW (ref 36.0–46.0)
Hemoglobin: 10.8 g/dL — ABNORMAL LOW (ref 12.0–15.0)
Immature Granulocytes: 0 %
Lymphocytes Relative: 23 %
Lymphs Abs: 2.3 10*3/uL (ref 0.7–4.0)
MCH: 22.9 pg — ABNORMAL LOW (ref 26.0–34.0)
MCHC: 30.3 g/dL (ref 30.0–36.0)
MCV: 75.6 fL — ABNORMAL LOW (ref 80.0–100.0)
Monocytes Absolute: 0.8 10*3/uL (ref 0.1–1.0)
Monocytes Relative: 8 %
Neutro Abs: 6.7 10*3/uL (ref 1.7–7.7)
Neutrophils Relative %: 66 %
Platelets: 741 10*3/uL — ABNORMAL HIGH (ref 150–400)
RBC: 4.72 MIL/uL (ref 3.87–5.11)
RDW: 17.5 % — ABNORMAL HIGH (ref 11.5–15.5)
WBC: 10 10*3/uL (ref 4.0–10.5)
nRBC: 0 % (ref 0.0–0.2)

## 2022-06-13 LAB — FERRITIN: Ferritin: 3 ng/mL — ABNORMAL LOW (ref 11–307)

## 2022-06-13 LAB — LACTATE DEHYDROGENASE: LDH: 171 U/L (ref 98–192)

## 2022-06-13 NOTE — Patient Instructions (Addendum)
[place] at [facility] Discharge Instructions   You were seen and examined today by Dr. Delton Coombes. He is a blood specialist. Your primary care provider referred you to our clinic for your elevated platelet count.   We will get some lab work on you today to investigate this further.  Return as scheduled to review these results.     Thank you for choosing [place] at [facility] to provide your oncology and hematology care.  To afford each patient quality time with our provider, please arrive at least 15 minutes before your scheduled appointment time.   If you have a lab appointment with the [facility] please come in thru the Main Entrance and check in at the main information desk.  You need to re-schedule your appointment should you arrive 10 or more minutes late.  We strive to give you quality time with our providers, and arriving late affects you and other patients whose appointments are after yours.  Also, if you no show three or more times for appointments you may be dismissed from the clinic at the providers discretion.     Again, thank you for choosing [facility].  Our hope is that these requests will decrease the amount of time that you wait before being seen by our physicians.       _____________________________________________________________  Should you have questions after your visit to [facility], please contact our office at [phone] and follow the prompts.  Our office hours are 8:00 a.m. and 4:30 p.m. Monday - Friday.  Please note that voicemails left after 4:00 p.m. may not be returned until the following business day.  We are closed weekends and major holidays.  You do have access to a nurse 24-7, just call the main number to the clinic [phone] and do not press any options, hold on the line and a nurse will answer the phone.    For prescription refill requests, have your pharmacy contact  our office and allow 72 hours.    Due to Covid, you will need to wear a mask upon entering the hospital. If you do not have a mask, a mask will be given to you at the Main Entrance upon arrival. For doctor visits, patients may have 1 support person age 5 or older with them. For treatment visits, patients can not have anyone with them due to social distancing guidelines and our immunocompromised population.

## 2022-06-13 NOTE — Progress Notes (Deleted)
GYNECOLOGY  VISIT   HPI: 56 y.o.   Married  Caucasian  female   [id number] with No LMP recorded.   here for  pre op check   GYNECOLOGIC HISTORY: No LMP recorded. Contraception:  Coitus interruptus Menopausal hormone therapy:  none Last mammogram:  05-04-21 normal BiRADS 1 Cat. B Last pap smear:   06-17-21 ASCUS Neg HPV        OB History     Gravida  2   Para  1   Term  1   Preterm      AB  1   Living  1      SAB      IAB  1   Ectopic      Multiple      Live Births  1              Patient Active Problem List   Diagnosis Date Noted   Hypertension 10/10/2019   Hyperlipidemia 10/10/2019   Acute ST elevation myocardial infarction (STEMI) due to occlusion of left anterior descending (LAD) coronary artery (HCC)    Atypical squamous cell changes of undetermined significance (ASCUS) on cervical cytology with positive high risk human papilloma virus (HPV) 06/06/2014    Past Medical History:  Diagnosis Date   Abnormal Pap smear of cervix    Essential hypertension    GERD (gastroesophageal reflux disease)    History of colonic polyps    History of migraine    Occasional, menustral cycle related   Hx of chlamydia infection 1989   Hyperlipidemia    STEMI (ST elevation myocardial infarction) ([place])    10/08/19 PCI/DESx1 to pLAD    Past Surgical History:  Procedure Laterality Date   BREAST BIOPSY Right    BREAST SURGERY  01/2016   Rt.breast bx--?[place]   COLONOSCOPY WITH PROPOFOL N/A 11/28/2014   Procedure: COLONOSCOPY WITH PROPOFOL;  Surgeon: Cleotis Nipper, MD;  Location: [facility];  Service: Endoscopy;  Laterality: N/A;   CORONARY/GRAFT ACUTE MI REVASCULARIZATION N/A 10/08/2019   Procedure: Coronary/Graft Acute MI Revascularization;  Surgeon: Burnell Blanks, MD;  Location: [place];  Service: Cardiovascular;  Laterality: N/A;   Fibroid resection  01/2012   Dr. Joan Flores   HYSTEROSCOPY  01/2012   Dr. Joan Flores   LEFT HEART CATH AND CORONARY  ANGIOGRAPHY N/A 10/08/2019   Procedure: LEFT HEART CATH AND CORONARY ANGIOGRAPHY;  Surgeon: Burnell Blanks, MD;  Location: [place] [facility];  Service: Cardiovascular;  Laterality: N/A;   SKIN LESION EXCISION     --actually frozen by dermatology--benign   TOE SURGERY     Toe nail removed    Current Outpatient Medications  Medication Sig Dispense Refill   atenolol (TENORMIN) 50 MG tablet Take 50 mg by mouth daily with lunch.      atorvastatin (LIPITOR) 80 MG tablet TAKE 1 TABLET BY MOUTH AT BEDTIME . APPOINTMENT REQUIRED FOR FUTURE REFILLS (OVERDUE FROM 03/2021) 30 tablet 0   BAYER ASPIRIN EC LOW DOSE 81 MG EC tablet Take 1 tablet by mouth daily.     cholecalciferol (VITAMIN D) 1000 units tablet Take 2,000 Units by mouth daily.      Cyanocobalamin (VITAMIN B-12) 2500 MCG SUBL Take 1 tablet by mouth daily.      ketoconazole (NIZORAL) 2 % shampoo Apply topically.     medroxyPROGESTERone (PROVERA) 10 MG tablet Take 1 tablet (10 mg total) by mouth daily. 10 tablet 0   nitroGLYCERIN (NITROSTAT) 0.4 MG SL tablet Place  1 tablet (0.4 mg total) under the tongue every 5 (five) minutes as needed for chest pain. 25 tablet 0   omeprazole (PRILOSEC) 20 MG capsule Take 20 mg by mouth daily with lunch.      No current facility-administered medications for this visit.     ALLERGIES: Amoxicillin  Family History  Problem Relation Age of Onset   Cancer Mother        multiple myeloma   Hyperlipidemia Mother    Macular degeneration Mother    Cancer - Colon Father    Hypertension Father    Hyperlipidemia Father    Heart disease Father    Hypertension Brother    Heart disease Brother 28       Heart attack   Cancer - Colon Maternal Aunt    Cancer - Colon Maternal Uncle    Cancer - Colon Maternal Grandmother    Heart disease Paternal Grandfather     Social History   Socioeconomic History   Marital status: Married    Spouse name: Not on file   Number of children: Not on file   Years of  education: Not on file   Highest education level: Not on file  Occupational History   Not on file  Tobacco Use   Smoking status: Former    Packs/day: 0.00    Types: Cigarettes    Quit date: 10/08/2019    Years since quitting: 2.6   Smokeless tobacco: Never   Tobacco comments:    smokes 1/2 pack per year  Vaping Use   Vaping Use: Never used  Substance and Sexual Activity   Alcohol use: No    Alcohol/week: 0.0 standard drinks of alcohol   Drug use: No   Sexual activity: Yes    Partners: Male    Birth control/protection: Other-see comments    Comment: withdrawal  Other Topics Concern   Not on file  Social History Narrative   Not on file   Social Determinants of Health   Financial Resource Strain: Not on file  Food Insecurity: Not on file  Transportation Needs: Not on file  Physical Activity: Not on file  Stress: Not on file  Social Connections: Not on file  Intimate Partner Violence: Not on file    Review of Systems  PHYSICAL EXAMINATION:    There were no vitals taken for this visit.    General appearance: alert, cooperative and appears stated age Head: Normocephalic, without obvious abnormality, atraumatic Neck: no adenopathy, supple, symmetrical, trachea midline and thyroid normal to inspection and palpation Lungs: clear to auscultation bilaterally Breasts: normal appearance, no masses or tenderness, No nipple retraction or dimpling, No nipple discharge or bleeding, No axillary or supraclavicular adenopathy Heart: regular rate and rhythm Abdomen: soft, non-tender, no masses,  no organomegaly Extremities: extremities normal, atraumatic, no cyanosis or edema Skin: Skin color, texture, turgor normal. No rashes or lesions Lymph nodes: Cervical, supraclavicular, and axillary nodes normal. No abnormal inguinal nodes palpated Neurologic: Grossly normal  Pelvic: External genitalia:  no lesions              Urethra:  normal appearing urethra with no masses, tenderness  or lesions              Bartholins and Skenes: normal                 Vagina: normal appearing vagina with normal color and discharge, no lesions              Cervix:  no lesions                Bimanual Exam:  Uterus:  normal size, contour, position, consistency, mobility, non-tender              Adnexa: no mass, fullness, tenderness              Rectal exam: {yes no:[id number]}.  Confirms.              Anus:  normal sphincter tone, no lesions  Chaperone was present for exam:  ***  ASSESSMENT     PLAN     An After Visit Summary was printed and given to the patient.  ______ minutes face to face time of which over 50% was spent in counseling.

## 2022-06-13 NOTE — Progress Notes (Signed)
[place], [place] [address]   CLINIC:  Medical Oncology/Hematology  Patient Care Team: London Pepper, MD as PCP - General (Family Medicine) Satira Sark, MD as PCP - Cardiology (Cardiology) Salvadore Dom, MD as Consulting Physician (Obstetrics and Gynecology)  CHIEF COMPLAINTS/PURPOSE OF CONSULTATION:  Evaluation for thrombocytosis  HISTORY OF PRESENTING ILLNESS:  Carmen Edwards 56 y.o. female is here because of evaluation for thrombocytosis, at the request of Dr. Quincy Simmonds.  Today she reports feeling well. She reports heavy menstrual bleeding since last year, and she has had heavy vaginal bleeding every day for the past 40 days. Her menses have been irregular over the past 3-4 years, occurring about every 8 months. She denies frequent hot flashes. She denies fevers, night sweats, and weight loss. She denies DVT and PE,. She denies itching after showers and changing colors of fingertips. She has been taking 81 mg Asprin since a MI on 10/08/2019.   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 She denies unusual or excessive chemical or pesticide exposure. Her mother has multiple myeloma, her maternal aunt and maternal uncle had colon cancer, her maternal grandmother had uterine cancer, and her maternal grandfather had prostate cancer. She denies family history of polycythemia. She quit smoking 10-15 years ago after smoking 1/2 ppd.   MEDICAL HISTORY:  Past Medical History:  Diagnosis Date   Abnormal Pap smear of cervix    Essential hypertension    GERD (gastroesophageal reflux disease)    History of colonic polyps    History of migraine    Occasional, menustral cycle related   Hx of chlamydia infection 1989   Hyperlipidemia    STEMI (ST elevation myocardial infarction) ([place])    10/08/19 PCI/DESx1 to pLAD    SURGICAL HISTORY: Past Surgical History:  Procedure Laterality Date   BREAST BIOPSY Right     BREAST SURGERY  01/2016   Rt.breast bx--?[place]   COLONOSCOPY WITH PROPOFOL N/A 11/28/2014   Procedure: COLONOSCOPY WITH PROPOFOL;  Surgeon: Cleotis Nipper, MD;  Location: [facility];  Service: Endoscopy;  Laterality: N/A;   CORONARY/GRAFT ACUTE MI REVASCULARIZATION N/A 10/08/2019   Procedure: Coronary/Graft Acute MI Revascularization;  Surgeon: Burnell Blanks, MD;  Location: [facility];  Service: Cardiovascular;  Laterality: N/A;   Fibroid resection  01/2012   Dr. Joan Flores   HYSTEROSCOPY  01/2012   Dr. Joan Flores   LEFT HEART CATH AND CORONARY ANGIOGRAPHY N/A 10/08/2019   Procedure: LEFT HEART CATH AND CORONARY ANGIOGRAPHY;  Surgeon: Burnell Blanks, MD;  Location: [facility];  Service: Cardiovascular;  Laterality: N/A;   SKIN LESION EXCISION     --actually frozen by dermatology--benign   TOE SURGERY     Toe nail removed    SOCIAL HISTORY: Social History   Socioeconomic History   Marital status: Married    Spouse name: Not on file   Number of children: Not on file   Years of education: Not on file   Highest education level: Not on file  Occupational History   Not on file  Tobacco Use   Smoking status: Former    Packs/day: 0.00    Types: Cigarettes    Quit date: 10/08/2019    Years since quitting: 2.6   Smokeless tobacco: Never   Tobacco comments:    smokes 1/2 pack per year  Vaping Use   Vaping Use: Never used  Substance and Sexual Activity  Alcohol use: No    Alcohol/week: 0.0 standard drinks of alcohol   Drug use: No   Sexual activity: Yes    Partners: Male    Birth control/protection: Other-see comments    Comment: withdrawal  Other Topics Concern   Not on file  Social History Narrative   Not on file   Social Determinants of Health   Financial Resource Strain: Not on file  Food Insecurity: Not on file  Transportation Needs: Not on file  Physical Activity: Not on file  Stress: Not on file  Social Connections: Not on file   Intimate Partner Violence: Not on file    FAMILY HISTORY: Family History  Problem Relation Age of Onset   Cancer Mother        multiple myeloma   Hyperlipidemia Mother    Macular degeneration Mother    Cancer - Colon Father    Hypertension Father    Hyperlipidemia Father    Heart disease Father    Hypertension Brother    Heart disease Brother 5       Heart attack   Cancer - Colon Maternal Aunt    Cancer - Colon Maternal Uncle    Cancer - Colon Maternal Grandmother    Heart disease Paternal Grandfather     ALLERGIES:  is allergic to amoxicillin.  MEDICATIONS:  Current Outpatient Medications  Medication Sig Dispense Refill   atenolol (TENORMIN) 50 MG tablet Take 50 mg by mouth daily with lunch.      atorvastatin (LIPITOR) 80 MG tablet TAKE 1 TABLET BY MOUTH AT BEDTIME . APPOINTMENT REQUIRED FOR FUTURE REFILLS (OVERDUE FROM 03/2021) 30 tablet 0   BAYER ASPIRIN EC LOW DOSE 81 MG EC tablet Take 1 tablet by mouth daily.     cholecalciferol (VITAMIN D) 1000 units tablet Take 2,000 Units by mouth daily.      Cyanocobalamin (VITAMIN B-12) 2500 MCG SUBL Take 1 tablet by mouth daily.      omeprazole (PRILOSEC) 20 MG capsule Take 20 mg by mouth daily with lunch.      ketoconazole (NIZORAL) 2 % shampoo Apply topically. (Patient not taking: Reported on 06/13/2022)     nitroGLYCERIN (NITROSTAT) 0.4 MG SL tablet Place 1 tablet (0.4 mg total) under the tongue every 5 (five) minutes as needed for chest pain. 25 tablet 0   No current facility-administered medications for this visit.    REVIEW OF SYSTEMS:   Review of Systems  Constitutional:  Positive for fatigue. Negative for appetite change.  Respiratory:  Positive for shortness of breath.   Gastrointestinal:  Positive for abdominal pain (4/10 cramping).  Genitourinary:  Positive for menstrual problem and vaginal bleeding.   All other systems reviewed and are negative.    PHYSICAL EXAMINATION: ECOG PERFORMANCE STATUS: 0 -  Asymptomatic  Vitals:   06/13/22 1307  BP: (!) 155/70  Pulse: 71  Resp: 16  Temp: 98.2 F (36.8 C)  SpO2: 100%   Filed Weights   06/13/22 1307  Weight: 222 lb 10.6 oz (101 kg)   Physical Exam Vitals reviewed.  Constitutional:      Appearance: Normal appearance. She is obese.  Cardiovascular:     Rate and Rhythm: Normal rate and regular rhythm.     Pulses: Normal pulses.     Heart sounds: Normal heart sounds.  Pulmonary:     Effort: Pulmonary effort is normal.     Breath sounds: Normal breath sounds.  Abdominal:     Palpations: Abdomen is soft. There is  no hepatomegaly, splenomegaly or mass.     Tenderness: There is no abdominal tenderness.  Musculoskeletal:     Right lower leg: No edema.     Left lower leg: No edema.  Lymphadenopathy:     Cervical: No cervical adenopathy.     Right cervical: No superficial cervical adenopathy.    Left cervical: No superficial cervical adenopathy.     Upper Body:     Right upper body: No supraclavicular or axillary adenopathy.     Left upper body: No supraclavicular or axillary adenopathy.     Lower Body: No right inguinal adenopathy. No left inguinal adenopathy.  Neurological:     General: No focal deficit present.     Mental Status: She is alert and oriented to person, place, and time.  Psychiatric:        Mood and Affect: Mood normal.        Behavior: Behavior normal.      LABORATORY DATA:  I have reviewed the data as listed Recent Results (from the past 2160 hour(s))  CBC     Status: Abnormal   Collection Time: 05/31/22 12:47 PM  Result Value Ref Range   WBC 11.5 (H) 3.8 - 10.8 Thousand/uL   RBC 5.50 (H) 3.80 - 5.10 Million/uL   Hemoglobin 12.3 11.7 - 15.5 g/dL   HCT 41.6 35.0 - 45.0 %   MCV 75.6 (L) 80.0 - 100.0 fL   MCH 22.4 (L) 27.0 - 33.0 pg   MCHC 29.6 (L) 32.0 - 36.0 g/dL   RDW 16.2 (H) 11.0 - 15.0 %   Platelets 682 (H) 140 - 400 Thousand/uL   MPV 12.0 7.5 - 16.1 fL  Follicle stimulating hormone     Status:  None   Collection Time: 05/31/22 12:47 PM  Result Value Ref Range   FSH 22.4 mIU/mL    Comment:                     Reference Range .              Follicular Phase       0.9-60.4              Mid-cycle Peak         3.1-17.7              Luteal Phase           1.5- 9.1              Postmenopausal       23.0-116.3              .   Estradiol     Status: None   Collection Time: 05/31/22 12:47 PM  Result Value Ref Range   Estradiol 45 pg/mL    Comment:       Reference Range         Follicular Phase:    54-098         Mid-Cycle:           64-357         Luteal Phase:        56-214         Postmenopausal:      < or = 31 . Reference range established on post-pubertal patient population. No pre-pubertal reference range established using this assay. For any patients for whom low Estradiol levels are anticipated (e.g. males, pre-pubertal children and hypogonadal/post-menopausal  females), the [employer] Estradiol, Ultrasensitive,  LCMSMS assay is recommended (order code [phone]). . Please note: patients being treated with the drug  fulvestrant (Faslodex(R)) have demonstrated significant  interference in immunoassay methods for estradiol  measurement. The cross reactivity could lead to falsely  elevated estradiol test results leading to an  inappropriate clinical assessment of estrogen status. [facility] order code [id number]-Estradiol,  Ultrasensitive LC/MS/MS demonstrates negligible cross  re activity with fulvestrant.   Pregnancy, urine     Status: None   Collection Time: 05/31/22  1:34 PM  Result Value Ref Range   Preg Test, Ur NEGATIVE NEGATIVE  Surgical pathology( [facility]/ [facility])     Status: None   Collection Time: 06/02/22  3:14 PM  Result Value Ref Range   SURGICAL PATHOLOGY      SURGICAL PATHOLOGY CASE: [id number] PATIENT: Leta Speller Surgical Pathology Report     Clinical History: Menorrhagia with irregular menses,  thickened endometrium (ms)     FINAL MICROSCOPIC DIAGNOSIS:  A. ENDOMETRIUM, BIOPSY: - Benign proliferative endometrium - Negative for hyperplasia or malignancy      GROSS DESCRIPTION:  Specimen is received in formalin, and consists of a 2.4 x 2.0 x 0.4 cm aggregate of tan-red soft tissue and clotted blood.  The specimen is entirely submitted in 2 cassettes.  Craig Staggers 06/03/2022)    Final Diagnosis performed by Jaquita Folds, MD.   Electronically signed 06/06/2022 Technical component performed at [employer]. [facility], [place] [address], [place], [place] [address].  Professional component performed at [facility], [place] [address], [place], [place] [address].  Immunohistochemistry Technical component (if applicable) was performed at [facility]. [address],  [place], [place], [place] [address].   IMMUNOHISTOCHEMISTRY DISCLAIMER (if applicable): Some of these immunohistochemical stains may have been developed and the performance characteristics determine by [facility]. Some may not have been cleared or approved by the U.S. Food and Drug Administration. The FDA has determined that such clearance or approval is not necessary. This test is used for clinical purposes. It should not be regarded as investigational or for research. This laboratory is certified under the [place] (CLIA-88) as qualified to perform high complexity clinical laboratory testing.  The controls stained appropriately.   CBC with Differential     Status: Abnormal   Collection Time: 06/13/22  1:31 PM  Result Value Ref Range   WBC 10.0 4.0 - 10.5 K/uL   RBC 4.72 3.87 - 5.11 MIL/uL   Hemoglobin 10.8 (L) 12.0 - 15.0 g/dL   HCT 35.7 (L) 36.0 - 46.0 %   MCV 75.6 (L) 80.0 - 100.0 fL   MCH 22.9 (L) 26.0 - 34.0 pg   MCHC 30.3 30.0 - 36.0 g/dL   RDW 17.5 (H) 11.5 - 15.5 %   Platelets 741 (H) 150 - 400 K/uL    nRBC 0.0 0.0 - 0.2 %   Neutrophils Relative % 66 %   Neutro Abs 6.7 1.7 - 7.7 K/uL   Lymphocytes Relative 23 %   Lymphs Abs 2.3 0.7 - 4.0 K/uL   Monocytes Relative 8 %   Monocytes Absolute 0.8 0.1 - 1.0 K/uL   Eosinophils Relative 2 %   Eosinophils Absolute 0.2 0.0 - 0.5 K/uL   Basophils Relative 1 %   Basophils Absolute 0.1 0.0 - 0.1 K/uL   Immature Granulocytes 0 %   Abs Immature Granulocytes 0.03 0.00 - 0.07 K/uL    Comment: Performed at [facility], [address]  [place], [place] [address]  Lactate dehydrogenase     Status: None   Collection Time: 06/13/22  1:31 PM  Result Value Ref Range   LDH 171 98 - 192 U/L    Comment: Performed at [facility], [address], [place], [place] [address]    RADIOGRAPHIC STUDIES: I have personally reviewed the radiological images as listed and agreed with the findings in the report. US PELVIS TRANSVAGINAL NON-OB (TV ONLY)  Result Date: 06/12/2022 Indication:  Menorrhagia with irregular menses.  Known fibroids. Pelvic US Uterus 8.53 x 7.83 x 5.57 cm. Multiple fibroids:  1.93 cm, 1.40 cm, 1.76 cm, 1.06 cm, 1.19 cm, 1.26 cm. EMS 11.96 mm.  Echogenic mass 16 x 14 mm with feeder vessel, separate from submucous fibroid. Left ovary 2.66 x 2.08 x 2.01 cm.  Follicle 16 x 15 mm. Right ovary 2.58 x 1.49 x 1.83 cm. No adnexal mass. No free fluid.          Impression:  Multiple fibroids.  Thickened endometrium with suspected possible poly and submucous fibroid.   ASSESSMENT:  Thrombocytosis: - Patient evaluated for thrombocytosis ranging between 421-741 since February 2013 - No aquagenic pruritus/vasomotor symptoms/thrombosis/B symptoms - She reports irregular menses for the last 3 to 4 years, menstrual bleeding continuously for the last 40 days.  She is scheduled for hysteroscopy and D&C on 07/05/2022 - She also developed microcytosis for the last year with low normal hemoglobin, indicating iron deficiency state. - She is on baby aspirin since MI on  10/08/2019   Social/family history: - She worked Marketing executive jobs.  No exposure to chemicals.  Smoked half pack per day and quit about 10 to 15 years ago. - Mother had multiple myeloma.  Maternal aunt and uncle had colon cancer.  Maternal grandmother had uterine cancer.  Maternal grandfather had prostate cancer.   PLAN:  Thrombocytosis: - We discussed causes of reactive thrombocytosis and clonal causes including myeloproliferative neoplasms.  She also had intermittent leukocytosis, predominantly neutrophils over the last 10 years. - We will repeat her CBC today and check ferritin and iron panel. - We will also check JAK2 V617F and BCR/ABL by FISH to rule out clonal disorders. - RTC 3 weeks for follow-up to discuss results.   All questions were answered. The patient knows to call the clinic with any problems, questions or concerns.  Derek Jack, MD 06/13/22 2:25 PM  [place] [phone]   I, Thana Ates, am acting as a scribe for Dr. Derek Jack.  I, Derek Jack MD, have reviewed the above documentation for accuracy and completeness, and I agree with the above.

## 2022-06-15 ENCOUNTER — Encounter: Payer: Self-pay | Admitting: Obstetrics and Gynecology

## 2022-06-15 ENCOUNTER — Ambulatory Visit: Payer: Commercial Managed Care - PPO | Admitting: Obstetrics and Gynecology

## 2022-06-15 VITALS — BP 132/82 | HR 79 | Ht 64.5 in | Wt 225.0 lb

## 2022-06-15 DIAGNOSIS — D219 Benign neoplasm of connective and other soft tissue, unspecified: Secondary | ICD-10-CM

## 2022-06-15 DIAGNOSIS — N921 Excessive and frequent menstruation with irregular cycle: Secondary | ICD-10-CM | POA: Diagnosis not present

## 2022-06-15 LAB — ERYTHROPOIETIN: Erythropoietin: 8.9 m[IU]/mL (ref 2.6–18.5)

## 2022-06-15 LAB — CBC
HCT: 36.7 % (ref 35.0–45.0)
Hemoglobin: 11.3 g/dL — ABNORMAL LOW (ref 11.7–15.5)
MCH: 22.7 pg — ABNORMAL LOW (ref 27.0–33.0)
MCHC: 30.8 g/dL — ABNORMAL LOW (ref 32.0–36.0)
MCV: 73.8 fL — ABNORMAL LOW (ref 80.0–100.0)
MPV: 11.4 fL (ref 7.5–12.5)
Platelets: 904 10*3/uL — ABNORMAL HIGH (ref 140–400)
RBC: 4.97 10*6/uL (ref 3.80–5.10)
RDW: 16.2 % — ABNORMAL HIGH (ref 11.0–15.0)
WBC: 13.8 10*3/uL — ABNORMAL HIGH (ref 3.8–10.8)

## 2022-06-15 MED ORDER — NORETHINDRONE ACETATE 5 MG PO TABS: 5.0000 mg | ORAL_TABLET | Freq: Every day | ORAL | 0 refills | Status: DC

## 2022-06-15 NOTE — Progress Notes (Unsigned)
GYNECOLOGY  VISIT   HPI: 56 y.o.   Married  Caucasian  female   [id number] with No LMP recorded.   here for heavy bleeding and pre op exam. Changing super pad every hour x2 weeks. Patient states medication only slowed bleeding for 2 days.  Husband present for discussion today.   Last ASA yesterday.   Told to stop 7 days prior to surgery.  GYNECOLOGIC HISTORY: No LMP recorded. Contraception:  coitus interruptus Menopausal hormone therapy:  none Last mammogram:    05-04-21 3D/Neg/BiRads1 Last pap smear:   06-17-21 ASCUS:Neg HR HPV, 06-05-18 Neg:Neg HR HPV,  05/19/17 Pap and HR HPV neg               OB History     Gravida  2   Para  1   Term  1   Preterm      AB  1   Living  1      SAB      IAB  1   Ectopic      Multiple      Live Births  1              Patient Active Problem List   Diagnosis Date Noted   Thrombocytosis 06/13/2022   Hypertension 10/10/2019   Hyperlipidemia 10/10/2019   Acute ST elevation myocardial infarction (STEMI) due to occlusion of left anterior descending (LAD) coronary artery (HCC)    Atypical squamous cell changes of undetermined significance (ASCUS) on cervical cytology with positive high risk human papilloma virus (HPV) 06/06/2014    Past Medical History:  Diagnosis Date   Abnormal Pap smear of cervix    Essential hypertension    GERD (gastroesophageal reflux disease)    History of colonic polyps    History of migraine    Occasional, menustral cycle related   Hx of chlamydia infection 1989   Hyperlipidemia    STEMI (ST elevation myocardial infarction) ([place])    10/08/19 PCI/DESx1 to pLAD    Past Surgical History:  Procedure Laterality Date   BREAST BIOPSY Right    BREAST SURGERY  01/2016   Rt.breast bx--?[place]   COLONOSCOPY WITH PROPOFOL N/A 11/28/2014   Procedure: COLONOSCOPY WITH PROPOFOL;  Surgeon: Cleotis Nipper, MD;  Location: [facility];  Service: Endoscopy;  Laterality: N/A;   CORONARY/GRAFT ACUTE MI  REVASCULARIZATION N/A 10/08/2019   Procedure: Coronary/Graft Acute MI Revascularization;  Surgeon: Burnell Blanks, MD;  Location: [facility];  Service: Cardiovascular;  Laterality: N/A;   Fibroid resection  01/2012   Dr. Joan Flores   HYSTEROSCOPY  01/2012   Dr. Joan Flores   LEFT HEART CATH AND CORONARY ANGIOGRAPHY N/A 10/08/2019   Procedure: LEFT HEART CATH AND CORONARY ANGIOGRAPHY;  Surgeon: Burnell Blanks, MD;  Location: [facility];  Service: Cardiovascular;  Laterality: N/A;   SKIN LESION EXCISION     --actually frozen by dermatology--benign   TOE SURGERY     Toe nail removed    Current Outpatient Medications  Medication Sig Dispense Refill   atenolol (TENORMIN) 50 MG tablet Take 50 mg by mouth daily with lunch.      atorvastatin (LIPITOR) 80 MG tablet TAKE 1 TABLET BY MOUTH AT BEDTIME . APPOINTMENT REQUIRED FOR FUTURE REFILLS (OVERDUE FROM 03/2021) 30 tablet 0   BAYER ASPIRIN EC LOW DOSE 81 MG EC tablet Take 1 tablet by mouth daily.     cholecalciferol (VITAMIN D) 1000 units tablet Take 2,000 Units by mouth daily.  Cyanocobalamin (VITAMIN B-12) 2500 MCG SUBL Take 1 tablet by mouth daily.      ketoconazole (NIZORAL) 2 % shampoo Apply topically.     omeprazole (PRILOSEC) 20 MG capsule Take 20 mg by mouth daily with lunch.      nitroGLYCERIN (NITROSTAT) 0.4 MG SL tablet Place 1 tablet (0.4 mg total) under the tongue every 5 (five) minutes as needed for chest pain. 25 tablet 0   No current facility-administered medications for this visit.     ALLERGIES: Amoxicillin  Family History  Problem Relation Age of Onset   Cancer Mother        multiple myeloma   Hyperlipidemia Mother    Macular degeneration Mother    Cancer - Colon Father    Hypertension Father    Hyperlipidemia Father    Heart disease Father    Hypertension Brother    Heart disease Brother 68       Heart attack   Cancer - Colon Maternal Aunt    Cancer - Colon Maternal Uncle    Cancer -  Colon Maternal Grandmother    Heart disease Paternal Grandfather     Social History   Socioeconomic History   Marital status: Married    Spouse name: Not on file   Number of children: Not on file   Years of education: Not on file   Highest education level: Not on file  Occupational History   Not on file  Tobacco Use   Smoking status: Former    Packs/day: 0.00    Types: Cigarettes    Quit date: 10/08/2019    Years since quitting: 2.6   Smokeless tobacco: Never   Tobacco comments:    smokes 1/2 pack per year  Vaping Use   Vaping Use: Never used  Substance and Sexual Activity   Alcohol use: No    Alcohol/week: 0.0 standard drinks of alcohol   Drug use: No   Sexual activity: Yes    Partners: Male    Birth control/protection: Other-see comments    Comment: withdrawal  Other Topics Concern   Not on file  Social History Narrative   Not on file   Social Determinants of Health   Financial Resource Strain: Not on file  Food Insecurity: Not on file  Transportation Needs: Not on file  Physical Activity: Not on file  Stress: Not on file  Social Connections: Not on file  Intimate Partner Violence: Not on file    Review of Systems  Genitourinary:  Positive for vaginal bleeding.  All other systems reviewed and are negative.   PHYSICAL EXAMINATION:    BP 132/82   Pulse 79   Ht 5' 4.5" (1.638 m)   Wt 225 lb (102.1 kg)   SpO2 98%   BMI 38.02 kg/m     General appearance: alert, cooperative and appears stated age Head: Normocephalic, without obvious abnormality, atraumatic Neck: no adenopathy, supple, symmetrical, trachea midline and thyroid normal to inspection and palpation Lungs: clear to auscultation bilaterally Breasts: normal appearance, no masses or tenderness, No nipple retraction or dimpling, No nipple discharge or bleeding, No axillary or supraclavicular adenopathy Heart: regular rate and rhythm Abdomen: soft, non-tender, no masses,  no  organomegaly Extremities: extremities normal, atraumatic, no cyanosis or edema Skin: Skin color, texture, turgor normal. No rashes or lesions Lymph nodes: Cervical, supraclavicular, and axillary nodes normal. No abnormal inguinal nodes palpated Neurologic: Grossly normal  Pelvic: External genitalia:  no lesions  Urethra:  normal appearing urethra with no masses, tenderness or lesions              Bartholins and Skenes: normal                 Vagina: normal appearing vagina with normal color and discharge, no lesions              Cervix: no lesions                Bimanual Exam:  Uterus:  normal size, contour, position, consistency, mobility, non-tender              Adnexa: no mass, fullness, tenderness              Rectal exam: {yes no:[id number]}.  Confirms.              Anus:  normal sphincter tone, no lesions  Chaperone was present for exam:  ***  ASSESSMENT     PLAN     An After Visit Summary was printed and given to the patient.  ______ minutes face to face time of which over 50% was spent in counseling.

## 2022-06-15 NOTE — Telephone Encounter (Signed)
Spoke with patient. Patient reports bleeding has increased to changing saturated pad q1.5hrs with clots. Reports weakness. Patient is requesting earlier OV. OV scheduled for today at 12pm with Dr. Quincy Simmonds. Advised patient not to drive if feeling weak or lightheaded, patient states she has someone that can drive her.   Routing to Dr. Antony Blackbird.

## 2022-06-15 NOTE — Telephone Encounter (Signed)
Encounter reviewed and closed.  

## 2022-06-15 NOTE — Telephone Encounter (Deleted)
Call to patient. Per DPR, OK to leave message on voicemail.   Left voicemail requesting a return call to review benefits for Recommended Surgery with Brook Silva, MD, FACOG.  

## 2022-06-15 NOTE — Telephone Encounter (Signed)
Reviewed with Dr. Quincy Simmonds. Patient was seen in office today. Request to move surgery to earlier date.  Call placed to Central Scheduling, spoke with Maudie Mercury, surgery r/s to 06/23/22 at [facility], [facility].    Call placed to patient. Advised as seen above. Arrival time 0800. Reviewed pre-op instructions again, to include aspirin. Patient verbalizes understanding and appreciative of call.   Routing to provider for final review. Patient is agreeable to disposition. Will close encounter.  Cc: KimAlexis

## 2022-06-15 NOTE — Telephone Encounter (Addendum)
Spoke with patient regarding surgery benefits. Patient acknowledges understanding of information presented. Patient is aware that benefits presented are professional benefits only. Patient is aware the hospital will call with facility benefits. See account note.This insurance quote is not a guarantee of benefits or claims & is subject to change upon insurance company review. Pt aware front desk will call and collect surgery prepayment  Routing to Glorianne Manchester, RN.

## 2022-06-16 LAB — BCR-ABL1 FISH
Cells Analyzed: 200
Cells Counted: 200

## 2022-06-17 ENCOUNTER — Telehealth: Payer: Self-pay | Admitting: *Deleted

## 2022-06-17 ENCOUNTER — Encounter: Payer: Commercial Managed Care - PPO | Admitting: Obstetrics and Gynecology

## 2022-06-17 ENCOUNTER — Telehealth: Payer: Self-pay | Admitting: Cardiology

## 2022-06-17 NOTE — Telephone Encounter (Signed)
Call received from [place], [facility] PAT.  Was advised patient will need to be moved to [facility] due to cardiac Hx per Dr. Arby Barrette. Patient will need pre-op with anesthesia on Monday or Tuesday.   Call placed to central scheduling, case moved to [facility] on 6/29 at 10am, arrive 8am.   Call placed to [facility] PAT at [phone], left message advising surgery has been moved to [facility], I will call patient to provide update. Please return call to me or patient directly to schedule.    Call placed to patient, left detailed message regarding updated surgery location. Advised to return call to confirm message received.

## 2022-06-17 NOTE — Progress Notes (Addendum)
Chart reviewed by Christeen Douglas, PA-C and she stated that patient will need cardiac clearance before her surgery on 06-23-22.  Spoke with Carmelina Dane, RN at Dr. Rica Records office and advised her of this.  She will work on clearance and notify patient.

## 2022-06-17 NOTE — Telephone Encounter (Signed)
[facility] PAT confirmed message received, she will contact patient directly.   Routing to Dr. Marjorie Smolder.

## 2022-06-17 NOTE — Progress Notes (Addendum)
Reviewed patient history with dr Arby Barrette, mda, [facility] is to [facility] or due to cardiac status, and pt needs [facility] appoinmentt ,  left voicemail message and  epic inbasket for jill hamm, RN.  AddendumCarmelina Dane RN aware case to be moved to [facility] or and patient needs pre op visit Monday or Tuesday per dr hatchett, mda.

## 2022-06-19 LAB — JAK2 V617F RFX CALR/MPL/E12-15: JAK2 V617F %: 9.69 %

## 2022-06-20 ENCOUNTER — Encounter: Payer: Self-pay | Admitting: Physician Assistant

## 2022-06-20 ENCOUNTER — Ambulatory Visit: Payer: Commercial Managed Care - PPO | Admitting: Physician Assistant

## 2022-06-20 VITALS — BP 124/80 | HR 68 | Ht 65.0 in | Wt 219.4 lb

## 2022-06-20 DIAGNOSIS — I2510 Atherosclerotic heart disease of native coronary artery without angina pectoris: Secondary | ICD-10-CM | POA: Diagnosis not present

## 2022-06-20 DIAGNOSIS — I10 Essential (primary) hypertension: Secondary | ICD-10-CM

## 2022-06-20 DIAGNOSIS — Z01810 Encounter for preprocedural cardiovascular examination: Secondary | ICD-10-CM

## 2022-06-20 DIAGNOSIS — E782 Mixed hyperlipidemia: Secondary | ICD-10-CM

## 2022-06-20 NOTE — Progress Notes (Addendum)
Anesthesia Review:  PCP: London Pepper  Cardiologist : needs card clearance Rosaria Ferries 06/20/22 for preop clearance  DR Johnny Bridge - card  Chest x-ray : EKG : Echo : 2020  Stress test: Cardiac Cath :  2020  Activity level: can do a flight of stairs without difficulty  Sleep Study/ CPAP : none  Fasting Blood Sugar :      / Checks Blood Sugar -- times a day:   Blood Thinner/ Instructions /Last Dose: ASA / Instructions/ Last Dose :   81 mg 'aspirin  06/15/22- cbc - white count 13.8  pltc- 904  Platelet count elevated .  PT reports being followed by  Derek Jack LOV 06/13/2022.  CBC done 06/22/22 routed to Dr Quincy Simmonds.  Shawn Stall aware

## 2022-06-20 NOTE — Telephone Encounter (Signed)
Looks like the pt was seen today by Theodore Demark, PAC. I will forward this notes to her as well for appt today

## 2022-06-22 ENCOUNTER — Encounter (HOSPITAL_COMMUNITY): Payer: Self-pay

## 2022-06-22 ENCOUNTER — Encounter (HOSPITAL_COMMUNITY)
Admission: RE | Admit: 2022-06-22 | Discharge: 2022-06-22 | Disposition: A | Discharge status: Home / Self Care | Payer: Commercial Managed Care - PPO | Source: Ambulatory Visit | Attending: Obstetrics and Gynecology | Admitting: Obstetrics and Gynecology

## 2022-06-22 VITALS — BP 144/93 | HR 62 | Temp 98.8°F | Resp 16 | Ht 65.0 in | Wt 217.0 lb

## 2022-06-22 DIAGNOSIS — Z01818 Encounter for other preprocedural examination: Secondary | ICD-10-CM

## 2022-06-22 DIAGNOSIS — I2510 Atherosclerotic heart disease of native coronary artery without angina pectoris: Secondary | ICD-10-CM | POA: Diagnosis not present

## 2022-06-22 DIAGNOSIS — I252 Old myocardial infarction: Secondary | ICD-10-CM | POA: Diagnosis not present

## 2022-06-22 DIAGNOSIS — D250 Submucous leiomyoma of uterus: Secondary | ICD-10-CM | POA: Diagnosis not present

## 2022-06-22 DIAGNOSIS — I10 Essential (primary) hypertension: Secondary | ICD-10-CM | POA: Insufficient documentation

## 2022-06-22 DIAGNOSIS — N939 Abnormal uterine and vaginal bleeding, unspecified: Secondary | ICD-10-CM | POA: Diagnosis not present

## 2022-06-22 DIAGNOSIS — Z01812 Encounter for preprocedural laboratory examination: Secondary | ICD-10-CM | POA: Diagnosis present

## 2022-06-22 DIAGNOSIS — N9489 Other specified conditions associated with female genital organs and menstrual cycle: Secondary | ICD-10-CM | POA: Diagnosis not present

## 2022-06-22 DIAGNOSIS — K219 Gastro-esophageal reflux disease without esophagitis: Secondary | ICD-10-CM | POA: Diagnosis not present

## 2022-06-22 DIAGNOSIS — N921 Excessive and frequent menstruation with irregular cycle: Secondary | ICD-10-CM | POA: Diagnosis not present

## 2022-06-22 HISTORY — DX: Atherosclerotic heart disease of native coronary artery without angina pectoris: I25.10

## 2022-06-22 HISTORY — DX: Anemia, unspecified: D64.9

## 2022-06-22 LAB — BASIC METABOLIC PANEL
Anion gap: 5 (ref 5–15)
BUN: 8 mg/dL (ref 6–20)
CO2: 23 mmol/L (ref 22–32)
Calcium: 8.9 mg/dL (ref 8.9–10.3)
Chloride: 114 mmol/L — ABNORMAL HIGH (ref 98–111)
Creatinine, Ser: 0.7 mg/dL (ref 0.44–1.00)
GFR, Estimated: >60 mL/min (ref >60)
Glucose, Bld: 96 mg/dL (ref 70–99)
Potassium: 3.7 mmol/L (ref 3.5–5.1)
Sodium: 142 mmol/L (ref 135–145)

## 2022-06-22 LAB — CBC
HCT: 33.4 % — ABNORMAL LOW (ref 36.0–46.0)
Hemoglobin: 9.9 g/dL — ABNORMAL LOW (ref 12.0–15.0)
MCH: 22.5 pg — ABNORMAL LOW (ref 26.0–34.0)
MCHC: 29.6 g/dL — ABNORMAL LOW (ref 30.0–36.0)
MCV: 75.9 fL — ABNORMAL LOW (ref 80.0–100.0)
Platelets: 653 10*3/uL — ABNORMAL HIGH (ref 150–400)
RBC: 4.4 MIL/uL (ref 3.87–5.11)
RDW: 17.4 % — ABNORMAL HIGH (ref 11.5–15.5)
WBC: 10.9 10*3/uL — ABNORMAL HIGH (ref 4.0–10.5)
nRBC: 0 % (ref 0.0–0.2)

## 2022-06-22 NOTE — H&P (Signed)
Carmen Cobbs, MD Physician Gynecology Progress Notes    Signed Encounter Date:  06/15/2022   Signed        GYNECOLOGY  VISIT   HPI: 56 y.o.   Married  Caucasian  female   [id number] with No LMP recorded.   here for heavy bleeding and pre op exam. Changing super pad every hour x 2 weeks. Patient states Provera medication only slowed bleeding for 2 days.   Husband present for discussion today.    Patient seen 05/31/22 for menorrhagia of one month duration.  She has known fibroids.  Labs showed:  Estradiol 45 and FSH 22.4 WBC 11.5, Hgb 12.3, platelets 682,000. She was started on Provera 10 mg x 10 days.    Pelvic US done 06/02/22: Uterus 8.53 x 7.83 x 5.57 cm. Multiple fibroids:  1.93 cm, 1.40 cm, 1.76 cm, 1.06 cm, 1.19 cm, 1.26 cm.  EMS 11.96 mm.  1 cm submucosal fibroid.  Echogenic mass 16 x 14 mm with feeder vessel, separate from submucous fibroid.  Left ovary 2.66 x 2.08 x 2.01 cm.  Follicle 16 x 15 mm.  Right ovary 2.58 x 1.49 x 1.83 cm.  No adnexal mass.  No free fluid.      EMB 06/02/22: benign proliferative endometrium.    Elevated platelets on chart review prompted referral to hematology.   Last ASA yesterday.   Told to stop 7 days prior to surgery.   GYNECOLOGIC HISTORY: No LMP recorded. Contraception:  coitus interruptus Menopausal hormone therapy:  none Last mammogram:    05-04-21 3D/Neg/BiRads1 Last pap smear:   06-17-21 ASCUS:Neg HR HPV, 06-05-18 Neg:Neg HR HPV,  05/19/17 Pap and HR HPV neg               OB History       Gravida  2   Para  1   Term  1   Preterm      AB  1   Living  1        SAB      IAB  1   Ectopic      Multiple      Live Births  1                     Patient Active Problem List    Diagnosis Date Noted   Thrombocytosis 06/13/2022   Hypertension 10/10/2019   Hyperlipidemia 10/10/2019   Acute ST elevation myocardial infarction (STEMI) due to occlusion of left anterior descending (LAD) coronary artery  (HCC)     Atypical squamous cell changes of undetermined significance (ASCUS) on cervical cytology with positive high risk human papilloma virus (HPV) 06/06/2014          Past Medical History:  Diagnosis Date   Abnormal Pap smear of cervix     Essential hypertension     GERD (gastroesophageal reflux disease)     History of colonic polyps     History of migraine      Occasional, menustral cycle related   Hx of chlamydia infection 1989   Hyperlipidemia     STEMI (ST elevation myocardial infarction) ([place])      10/08/19 PCI/DESx1 to pLAD           Past Surgical History:  Procedure Laterality Date   BREAST BIOPSY Right     BREAST SURGERY   01/2016    Rt.breast bx--?[place]   COLONOSCOPY WITH PROPOFOL N/A 11/28/2014    Procedure: COLONOSCOPY WITH  PROPOFOL;  Surgeon: Cleotis Nipper, MD;  Location: [facility];  Service: Endoscopy;  Laterality: N/A;   CORONARY/GRAFT ACUTE MI REVASCULARIZATION N/A 10/08/2019    Procedure: Coronary/Graft Acute MI Revascularization;  Surgeon: Burnell Blanks, MD;  Location: [facility];  Service: Cardiovascular;  Laterality: N/A;   Fibroid resection   01/2012    Dr. Joan Flores   HYSTEROSCOPY   01/2012    Dr. Joan Flores   LEFT HEART CATH AND CORONARY ANGIOGRAPHY N/A 10/08/2019    Procedure: LEFT HEART CATH AND CORONARY ANGIOGRAPHY;  Surgeon: Burnell Blanks, MD;  Location: [facility];  Service: Cardiovascular;  Laterality: N/A;   SKIN LESION EXCISION        --actually frozen by dermatology--benign   TOE SURGERY        Toe nail removed            Current Outpatient Medications  Medication Sig Dispense Refill   atenolol (TENORMIN) 50 MG tablet Take 50 mg by mouth daily with lunch.        atorvastatin (LIPITOR) 80 MG tablet TAKE 1 TABLET BY MOUTH AT BEDTIME . APPOINTMENT REQUIRED FOR FUTURE REFILLS (OVERDUE FROM 03/2021) 30 tablet 0   BAYER ASPIRIN EC LOW DOSE 81 MG EC tablet Take 1 tablet by mouth daily. (Patient not taking: Reported  on 06/17/2022)       cholecalciferol (VITAMIN D) 1000 units tablet Take 2,000 Units by mouth daily.        Cyanocobalamin (VITAMIN B-12) 2500 MCG SUBL Take 2,500 mcg by mouth daily.       ketoconazole (NIZORAL) 2 % shampoo Apply 1 Application topically daily as needed for irritation.       norethindrone (AYGESTIN) 5 MG tablet Take 1 tablet (5 mg total) by mouth daily. 30 tablet 0   omeprazole (PRILOSEC) 20 MG capsule Take 20 mg by mouth daily with lunch.        ferrous sulfate 325 (65 FE) MG tablet Take 325 mg by mouth daily with breakfast.       nitroGLYCERIN (NITROSTAT) 0.4 MG SL tablet Place 1 tablet (0.4 mg total) under the tongue every 5 (five) minutes as needed for chest pain. 25 tablet 0    No current facility-administered medications for this visit.      ALLERGIES: Amoxicillin        Family History  Problem Relation Age of Onset   Cancer Mother          multiple myeloma   Hyperlipidemia Mother     Macular degeneration Mother     Cancer - Colon Father     Hypertension Father     Hyperlipidemia Father     Heart disease Father     Hypertension Brother     Heart disease Brother 58        Heart attack   Cancer - Colon Maternal Aunt     Cancer - Colon Maternal Uncle     Cancer - Colon Maternal Grandmother     Heart disease Paternal Grandfather        Social History         Socioeconomic History   Marital status: Married      Spouse name: Not on file   Number of children: Not on file   Years of education: Not on file   Highest education level: Not on file  Occupational History   Not on file  Tobacco Use   Smoking status: Former  Packs/day: 0.00      Types: Cigarettes      Quit date: 10/08/2019      Years since quitting: 2.6   Smokeless tobacco: Never   Tobacco comments:      smokes 1/2 pack per year  Vaping Use   Vaping Use: Never used  Substance and Sexual Activity   Alcohol use: No      Alcohol/week: 0.0 standard drinks of alcohol   Drug use: No    Sexual activity: Yes      Partners: Male      Birth control/protection: Other-see comments      Comment: withdrawal  Other Topics Concern   Not on file  Social History Narrative   Not on file    Social Determinants of Health    Financial Resource Strain: Not on file  Food Insecurity: Not on file  Transportation Needs: Not on file  Physical Activity: Not on file  Stress: Not on file  Social Connections: Not on file  Intimate Partner Violence: Not on file      Review of Systems  Genitourinary:  Positive for vaginal bleeding.  All other systems reviewed and are negative.     PHYSICAL EXAMINATION:     BP 132/82   Pulse 79   Ht 5' 4.5" (1.638 m)   Wt 225 lb (102.1 kg)   SpO2 98%   BMI 38.02 kg/m     General appearance: alert, cooperative and appears stated age Head: Normocephalic, without obvious abnormality, atraumatic Neck: no adenopathy, supple, symmetrical, trachea midline and thyroid normal to inspection and palpation Lungs: clear to auscultation bilaterally Heart: regular rate and rhythm Abdomen: soft, non-tender, no masses,  no organomegaly Extremities: extremities normal, atraumatic, no cyanosis or edema No abnormal inguinal nodes palpated Neurologic: Grossly normal   Pelvic: External genitalia:  no lesions.  Blood stained vulva.              Urethra:  normal appearing urethra with no masses, tenderness or lesions              Bartholins and Skenes: normal                 Vagina: normal appearing vagina with normal color and discharge, no lesions              Cervix: no lesions.  Small clot from os.                 Bimanual Exam:  Uterus:  normal size, contour, position, consistency, mobility, non-tender              Adnexa: no mass, fullness, tenderness   Chaperone was present for exam:  yes.   ASSESSMENT   Menorrhagia with irregular menses.  Uterine fibroids with suspected submucous fibroid and polyp.  Elevated platelets.  Undergoing work up. Hx MI.    PLAN   CBC. Will start Aygestin 5 mg daily. Increased risk of thrombosis discussed.  Discussion of hysteroscopy with Myosure myomectomy and polypectomy, dilation and curettage.  Risks, benefits, and alternatives reviewed. Risks include but are not limited to bleeding, infection, damage to surrounding organs including uterine perforation requiring hospitalization and laparoscopy, pulmonary edema, reaction to anesthesia, DVT, PE, death, need for further treatment. Surgical expectations and recovery discussed.  ACOG information on hysteroscopy and dilation and curettage to patient.  Patient wishes to proceed.   An After Visit Summary was printed and given to the patient.

## 2022-06-22 NOTE — Progress Notes (Signed)
Anesthesia Chart Review   Case: [id number] Date/Time: 06/23/22 0945   Procedure: DILATATION & CURETTAGE/HYSTEROSCOPY WITH MYOSURE   Anesthesia type: Choice   Pre-op diagnosis: abnormal uterine bleeding, submucous fibroid, endometrial mass   [facility]: [facility] / [facility]   Surgeons: Nunzio Cobbs, MD       DISCUSSION:56 y.o. former smoker with h/o GERD, HTN, CAD (STEMI 09/2019 s/p DES pLAD), abnormal uterine bleeding, endometrial mass scheduled for above procedure 06/23/2022 with Dr. Magdalene Molly.   Pt seen by cardiology 06/20/2022 for preoperative evaluation.  Per O Vnote, "Preoperative cardiovascular assessment. - RCRI is 1, for a 0.9% risk of major perioperative cardiac events - DASI is 36.7 giving her a functional capacity in METS of 7.25 - No additional cardiac work-up is needed in preparation for the upcoming surgery -It is okay to hold aspirin for the procedure, restart as soon as possible after the procedure"  Anticipate pt can proceed with planned procedure barring acute status change.   VS: BP (!) 144/93   Pulse 62   Temp 37.1 C (Oral)   Resp 16   Ht '5\' 5"'$  (1.651 m)   Wt 98.4 kg   SpO2 100%   BMI 36.11 kg/m   PROVIDERS: London Pepper, MD is PCP   Cardiologist:  Rozann Lesches, MD  LABS: Labs reviewed: Acceptable for surgery. (all labs ordered are listed, but only abnormal results are displayed)  Labs Reviewed  CBC  BASIC METABOLIC PANEL  TYPE AND SCREEN     IMAGES:   EKG: 06/20/2022 Rate 68 bpm  NSR Nonspecific T wave abnormality   CV: Echo 10/08/2019 1. Left ventricular ejection fraction, by visual estimation, is 60 to  65%. The left ventricle has normal function. Normal left ventricular size.  There is mildly increased left ventricular hypertrophy.   2. Global right ventricle has normal systolic function.The right  ventricular size is normal. No increase in right ventricular wall  thickness.   3. Left atrial size was normal.   4. Right  atrial size was normal.   5. The mitral valve is normal in structure. No evidence of mitral valve  regurgitation.   6. The tricuspid valve is normal in structure. Tricuspid valve  regurgitation was not visualized by color flow Doppler.   7. The aortic valve is tricuspid Aortic valve regurgitation was not  visualized by color flow Doppler.  Past Medical History:  Diagnosis Date   Abnormal Pap smear of cervix    Anemia    Coronary artery disease    Essential hypertension    Family history of adverse reaction to anesthesia    mother- has proble,ms with N/v   GERD (gastroesophageal reflux disease)    History of colonic polyps    History of migraine    Occasional, menustral cycle related   Hx of chlamydia infection 1989   Hyperlipidemia    STEMI (ST elevation myocardial infarction) ([place])    10/08/19 PCI/DESx1 to pLAD    Past Surgical History:  Procedure Laterality Date   BREAST BIOPSY Right    BREAST SURGERY  01/2016   Rt.breast bx--?[place]   COLONOSCOPY WITH PROPOFOL N/A 11/28/2014   Procedure: COLONOSCOPY WITH PROPOFOL;  Surgeon: Cleotis Nipper, MD;  [facility];  Service: Endoscopy;  Laterality: N/A;   CORONARY/GRAFT ACUTE MI REVASCULARIZATION N/A 10/08/2019   Procedure: Coronary/Graft Acute MI Revascularization;  Surgeon: Burnell Blanks, MD;  Location: [facility];  Service: Cardiovascular;  Laterality: N/A;   Fibroid resection  01/2012   Dr. Joan Flores   HYSTEROSCOPY  01/2012   Dr. Joan Flores   LEFT HEART CATH AND CORONARY ANGIOGRAPHY N/A 10/08/2019   Procedure: LEFT HEART CATH AND CORONARY ANGIOGRAPHY;  Surgeon: Burnell Blanks, MD;  Location: [facility];  Service: Cardiovascular;  Laterality: N/A;   SKIN LESION EXCISION     --actually frozen by dermatology--benign   TOE SURGERY     Toe nail removed    MEDICATIONS:  acetaminophen (TYLENOL) 500 MG tablet   atenolol (TENORMIN) 50 MG tablet   atorvastatin (LIPITOR) 80 MG tablet   BAYER  ASPIRIN EC LOW DOSE 81 MG EC tablet   cholecalciferol (VITAMIN D) 1000 units tablet   Cyanocobalamin (VITAMIN B-12) 2500 MCG SUBL   ferrous sulfate 325 (65 FE) MG tablet   ketoconazole (NIZORAL) 2 % shampoo   nitroGLYCERIN (NITROSTAT) 0.4 MG SL tablet   norethindrone (AYGESTIN) 5 MG tablet   omeprazole (PRILOSEC) 20 MG capsule   No current facility-administered medications for this encounter.    Konrad Felix Ward, PA-C [facility] [phone]

## 2022-06-22 NOTE — Anesthesia Preprocedure Evaluation (Addendum)
Anesthesia Evaluation  Patient identified by MRN, date of birth, ID band Patient awake    Reviewed: Allergy & Precautions, NPO status , Patient's Chart, lab work & pertinent test results  Airway Mallampati: III  TM Distance: >3 FB Neck ROM: Full    Dental no notable dental hx. (+) Dental Advisory Given   Pulmonary neg pulmonary ROS, former smoker,    Pulmonary exam normal        Cardiovascular hypertension, + Past MI and + Cardiac Stents  Normal cardiovascular exam  Echo 10/08/2019 1. Left ventricular ejection fraction, by visual estimation, is 60 to  65%. The left ventricle has normal function. Normal left ventricular size.  There is mildly increased left ventricular hypertrophy.  2. Global right ventricle has normal systolic function.The right  ventricular size is normal. No increase in right ventricular wall  thickness.  3. Left atrial size was normal.  4. Right atrial size was normal.  5. The mitral valve is normal in structure. No evidence of mitral valve  regurgitation.  6. The tricuspid valve is normal in structure. Tricuspid valve  regurgitation was not visualized by color flow Doppler.  7. The aortic valve is tricuspid Aortic valve regurgitation was not  visualized by color flow Doppler.   1. Acute anterior ST elevation MI secondary to thrombotic occlusion of the proximal LAD 2. Successful PTCA/DES x 1 proximal LAD 3. No obstructive disease in the Circumflex or RCA 4. Mild segmental LV systolic dysfunction with hypokinesis of the anterior wall and apex. LVEF around 40% by visual estimate.   Recommendations: Will admit to ICU and monitor on telemetry. Will plan to continue DAPT with ASA and Brilinta for one year. Continue beta blocker. Increase Lipitor to 80 mg daily. Will continue Aggrastat for 18 hours given distal LAD thrombus. Echo in am.      Neuro/Psych negative neurological ROS     GI/Hepatic Neg  liver ROS, GERD  ,  Endo/Other  negative endocrine ROS  Renal/GU negative Renal ROS     Musculoskeletal negative musculoskeletal ROS (+)   Abdominal   Peds  Hematology  (+) Blood dyscrasia, anemia ,   Anesthesia Other Findings   Reproductive/Obstetrics                           Anesthesia Physical Anesthesia Plan  ASA: 3  Anesthesia Plan: General   Post-op Pain Management: Celebrex PO (pre-op)* and Tylenol PO (pre-op)*   Induction: Intravenous  PONV Risk Score and Plan: 4 or greater and Ondansetron, Dexamethasone, Midazolam and Scopolamine patch - Pre-op  Airway Management Planned: LMA  Additional Equipment:   Intra-op Plan:   Post-operative Plan: Extubation in OR  Informed Consent: I have reviewed the patients History and Physical, chart, labs and discussed the procedure including the risks, benefits and alternatives for the proposed anesthesia with the patient or authorized representative who has indicated his/her understanding and acceptance.     Dental advisory given  Plan Discussed with: Anesthesiologist and CRNA  Anesthesia Plan Comments: (See PAT note 06/22/2022)      Anesthesia Quick Evaluation

## 2022-06-23 ENCOUNTER — Encounter (HOSPITAL_COMMUNITY)
Admission: RE | Disposition: A | Discharge status: Home / Self Care | Payer: Self-pay | Source: Ambulatory Visit | Attending: Obstetrics and Gynecology

## 2022-06-23 ENCOUNTER — Ambulatory Visit (HOSPITAL_BASED_OUTPATIENT_CLINIC_OR_DEPARTMENT_OTHER): Payer: Commercial Managed Care - PPO | Admitting: Certified Registered Nurse Anesthetist

## 2022-06-23 ENCOUNTER — Ambulatory Visit (HOSPITAL_COMMUNITY): Payer: Commercial Managed Care - PPO | Admitting: Certified Registered Nurse Anesthetist

## 2022-06-23 ENCOUNTER — Other Ambulatory Visit: Payer: Self-pay

## 2022-06-23 ENCOUNTER — Encounter (HOSPITAL_COMMUNITY): Payer: Self-pay | Admitting: Obstetrics and Gynecology

## 2022-06-23 ENCOUNTER — Ambulatory Visit (HOSPITAL_COMMUNITY)
Admission: RE | Admit: 2022-06-23 | Discharge: 2022-06-23 | Disposition: A | Discharge status: Home / Self Care | Payer: Commercial Managed Care - PPO | Source: Ambulatory Visit | Attending: Obstetrics and Gynecology | Admitting: Obstetrics and Gynecology

## 2022-06-23 DIAGNOSIS — Z87891 Personal history of nicotine dependence: Secondary | ICD-10-CM | POA: Insufficient documentation

## 2022-06-23 DIAGNOSIS — I10 Essential (primary) hypertension: Secondary | ICD-10-CM | POA: Diagnosis not present

## 2022-06-23 DIAGNOSIS — D250 Submucous leiomyoma of uterus: Secondary | ICD-10-CM

## 2022-06-23 DIAGNOSIS — I252 Old myocardial infarction: Secondary | ICD-10-CM

## 2022-06-23 DIAGNOSIS — N939 Abnormal uterine and vaginal bleeding, unspecified: Secondary | ICD-10-CM | POA: Diagnosis not present

## 2022-06-23 DIAGNOSIS — Z955 Presence of coronary angioplasty implant and graft: Secondary | ICD-10-CM | POA: Insufficient documentation

## 2022-06-23 DIAGNOSIS — N840 Polyp of corpus uteri: Secondary | ICD-10-CM

## 2022-06-23 DIAGNOSIS — Z01818 Encounter for other preprocedural examination: Secondary | ICD-10-CM

## 2022-06-23 DIAGNOSIS — N920 Excessive and frequent menstruation with regular cycle: Secondary | ICD-10-CM | POA: Insufficient documentation

## 2022-06-23 DIAGNOSIS — K219 Gastro-esophageal reflux disease without esophagitis: Secondary | ICD-10-CM | POA: Diagnosis not present

## 2022-06-23 DIAGNOSIS — N921 Excessive and frequent menstruation with irregular cycle: Secondary | ICD-10-CM

## 2022-06-23 DIAGNOSIS — N9489 Other specified conditions associated with female genital organs and menstrual cycle: Secondary | ICD-10-CM

## 2022-06-23 DIAGNOSIS — D219 Benign neoplasm of connective and other soft tissue, unspecified: Secondary | ICD-10-CM | POA: Diagnosis not present

## 2022-06-23 HISTORY — PX: DILATATION & CURETTAGE/HYSTEROSCOPY WITH MYOSURE: SHX6511

## 2022-06-23 LAB — TYPE AND SCREEN
ABO/RH(D): O NEG
Antibody Screen: NEGATIVE

## 2022-06-23 LAB — ABO/RH: ABO/RH(D): O NEG

## 2022-06-23 LAB — PREGNANCY, URINE: Preg Test, Ur: NEGATIVE

## 2022-06-23 SURGERY — DILATATION & CURETTAGE/HYSTEROSCOPY WITH MYOSURE
Anesthesia: General

## 2022-06-23 MED ORDER — LIDOCAINE HCL 1 % IJ SOLN
INTRAMUSCULAR | Status: DC | PRN
  Administered 2022-06-23: 10 mL

## 2022-06-23 MED ORDER — CHLORHEXIDINE GLUCONATE 0.12 % MT SOLN
15.0000 mL | Freq: Once | OROMUCOSAL | Status: AC
  Administered 2022-06-23: 15 mL via OROMUCOSAL

## 2022-06-23 MED ORDER — MIDAZOLAM HCL 5 MG/5ML IJ SOLN
INTRAMUSCULAR | Status: DC | PRN
  Administered 2022-06-23: 2 mg via INTRAVENOUS

## 2022-06-23 MED ORDER — FENTANYL CITRATE (PF) 100 MCG/2ML IJ SOLN
INTRAMUSCULAR | Status: DC | PRN
  Administered 2022-06-23 (×2): 50 ug via INTRAVENOUS

## 2022-06-23 MED ORDER — DEXAMETHASONE SODIUM PHOSPHATE 10 MG/ML IJ SOLN
INTRAMUSCULAR | Status: AC
  Filled 2022-06-23: qty 1

## 2022-06-23 MED ORDER — LACTATED RINGERS IV SOLN: INTRAVENOUS | Status: DC

## 2022-06-23 MED ORDER — ACETAMINOPHEN 500 MG PO TABS
1000.0000 mg | ORAL_TABLET | Freq: Once | ORAL | Status: AC
  Administered 2022-06-23: 1000 mg via ORAL
  Filled 2022-06-23: qty 2

## 2022-06-23 MED ORDER — DEXAMETHASONE SODIUM PHOSPHATE 4 MG/ML IJ SOLN
INTRAMUSCULAR | Status: DC | PRN
  Administered 2022-06-23: 10 mg via INTRAVENOUS

## 2022-06-23 MED ORDER — ORAL CARE MOUTH RINSE: 15.0000 mL | Freq: Once | OROMUCOSAL | Status: AC

## 2022-06-23 MED ORDER — ONDANSETRON HCL 4 MG/2ML IJ SOLN
INTRAMUSCULAR | Status: DC | PRN
  Administered 2022-06-23: 4 mg via INTRAVENOUS

## 2022-06-23 MED ORDER — LIDOCAINE HCL (PF) 1 % IJ SOLN
INTRAMUSCULAR | Status: AC
  Filled 2022-06-23: qty 30

## 2022-06-23 MED ORDER — EPHEDRINE SULFATE-NACL 50-0.9 MG/10ML-% IV SOSY
PREFILLED_SYRINGE | INTRAVENOUS | Status: DC | PRN
  Administered 2022-06-23 (×2): 5 mg via INTRAVENOUS

## 2022-06-23 MED ORDER — PROPOFOL 10 MG/ML IV BOLUS
INTRAVENOUS | Status: DC | PRN
  Administered 2022-06-23: 160 mg via INTRAVENOUS

## 2022-06-23 MED ORDER — VASOPRESSIN 20 UNIT/ML IV SOLN
INTRAVENOUS | Status: AC
  Filled 2022-06-23: qty 1

## 2022-06-23 MED ORDER — PROMETHAZINE HCL 25 MG/ML IJ SOLN: 6.2500 mg | INTRAMUSCULAR | Status: DC | PRN

## 2022-06-23 MED ORDER — AMISULPRIDE (ANTIEMETIC) 5 MG/2ML IV SOLN: 10.0000 mg | Freq: Once | INTRAVENOUS | Status: DC | PRN

## 2022-06-23 MED ORDER — MIDAZOLAM HCL 2 MG/2ML IJ SOLN
INTRAMUSCULAR | Status: AC
  Filled 2022-06-23: qty 2

## 2022-06-23 MED ORDER — ONDANSETRON HCL 4 MG/2ML IJ SOLN
INTRAMUSCULAR | Status: AC
  Filled 2022-06-23: qty 2

## 2022-06-23 MED ORDER — SODIUM CHLORIDE 0.9 % IR SOLN
Status: DC | PRN
  Administered 2022-06-23: 3000 mL

## 2022-06-23 MED ORDER — SODIUM CHLORIDE (PF) 0.9 % IJ SOLN
INTRAMUSCULAR | Status: AC
  Filled 2022-06-23: qty 100

## 2022-06-23 MED ORDER — FENTANYL CITRATE PF 50 MCG/ML IJ SOSY: 25.0000 ug | PREFILLED_SYRINGE | INTRAMUSCULAR | Status: DC | PRN

## 2022-06-23 MED ORDER — LIDOCAINE HCL (CARDIAC) PF 100 MG/5ML IV SOSY
PREFILLED_SYRINGE | INTRAVENOUS | Status: DC | PRN
  Administered 2022-06-23: 60 mg via INTRAVENOUS

## 2022-06-23 MED ORDER — FERROUS SULFATE 325 (65 FE) MG PO TABS: 325.0000 mg | ORAL_TABLET | Freq: Two times a day (BID) | ORAL | 1 refills | Status: DC

## 2022-06-23 MED ORDER — POVIDONE-IODINE 10 % EX SWAB: 2.0000 | Freq: Once | CUTANEOUS | Status: DC

## 2022-06-23 MED ORDER — FENTANYL CITRATE (PF) 100 MCG/2ML IJ SOLN
INTRAMUSCULAR | Status: AC
  Filled 2022-06-23: qty 2

## 2022-06-23 MED ORDER — LIDOCAINE HCL (PF) 2 % IJ SOLN
INTRAMUSCULAR | Status: AC
  Filled 2022-06-23: qty 5

## 2022-06-23 MED ORDER — CELECOXIB 200 MG PO CAPS
200.0000 mg | ORAL_CAPSULE | Freq: Once | ORAL | Status: AC
  Administered 2022-06-23: 200 mg via ORAL
  Filled 2022-06-23: qty 1

## 2022-06-23 MED ORDER — SCOPOLAMINE 1 MG/3DAYS TD PT72
1.0000 | MEDICATED_PATCH | TRANSDERMAL | Status: DC
  Administered 2022-06-23: 1.5 mg via TRANSDERMAL
  Filled 2022-06-23: qty 1

## 2022-06-23 SURGICAL SUPPLY — 20 items
CATH ROBINSON RED A/P 16FR (CATHETERS) ×3 IMPLANT
DEVICE MYOSURE LITE (MISCELLANEOUS) IMPLANT
DEVICE MYOSURE REACH (MISCELLANEOUS) IMPLANT
DILATOR CANAL MILEX (MISCELLANEOUS) IMPLANT
DRSG TELFA 3X8 NADH (GAUZE/BANDAGES/DRESSINGS) ×2 IMPLANT
GAUZE 4X4 16PLY ~~LOC~~+RFID DBL (SPONGE) ×6 IMPLANT
GLOVE BIO SURGEON STRL SZ 6.5 (GLOVE) ×3 IMPLANT
GOWN STRL REUS W/ TWL LRG LVL3 (GOWN DISPOSABLE) ×2 IMPLANT
GOWN STRL REUS W/TWL LRG LVL3 (GOWN DISPOSABLE) ×2
IV NS IRRIG 3000ML ARTHROMATIC (IV SOLUTION) ×3 IMPLANT
KIT PROCEDURE FLUENT (KITS) ×3 IMPLANT
KIT TURNOVER KIT A (KITS) ×3 IMPLANT
MYOSURE XL FIBROID (MISCELLANEOUS) ×2
PACK VAGINAL MINOR WOMEN LF (CUSTOM PROCEDURE TRAY) ×3 IMPLANT
PAD DRESSING TELFA 3X8 NADH (GAUZE/BANDAGES/DRESSINGS) ×2 IMPLANT
PAD OB MATERNITY 4.3X12.25 (PERSONAL CARE ITEMS) ×2 IMPLANT
SEAL CERVICAL OMNI LOK (ABLATOR) IMPLANT
SEAL ROD LENS SCOPE MYOSURE (ABLATOR) ×3 IMPLANT
SYSTEM TISS REMOVAL MYOSURE XL (MISCELLANEOUS) IMPLANT
TOWEL OR 17X26 10 PK STRL BLUE (TOWEL DISPOSABLE) ×3 IMPLANT

## 2022-06-23 NOTE — Op Note (Signed)
OPERATIVE REPORT   PREOPERATIVE DIAGNOSES:    Menorrhagia with irregular menses, submucous fibroid, endometrial mass  POSTOPERATIVE DIAGNOSES:    Menorrhagia with irregular menses, submucous fibroid, polypoid endometrium.   PROCEDURE:  Hysteroscopy with dilation and curettage and Myosure resection of submucous fibroid  SURGEON:  Lenard Galloway, MD  ANESTHESIA:  LMA, paracervical block with 10 mL of 1% lidocaine.  IV FLUIDS:   700 cc LR  EBL:   10 cc  URINE OUTPUT:   100 cc  NORMAL SALINE DEFICIT:    35 cc  COMPLICATIONS:  None.  INDICATIONS FOR THE PROCEDURE:     The patient is a 56 year old [id number] Caucasian female who presents with heavy and prolonged menstrual bleeding.  Pelvic ultrasound showed a multifibroid uterus and a 1 cm submucous fibroid and an echogenic mass 16 x 14 mm with a feeder vessel.  Office endometrial biopsy showed benign proliferative endometrium.  A plan is now made to proceed with a hysteroscopy with dilation and curettage and Myosurgical resection of the submucous fibroid and the endometrial mass; after risks, benefits and alternatives were reviewed.  FINDINGS:  Exam under anesthesia revealed a small anteverted, mobile uterus.  No adnexal masses were noted.  The uterus was sounded to 8 cm.   Hysteroscopy showed a 1 cm posterior fundal central submucous fibroid.  The endometrium appeared generally polypoid with no one discrete polyp noted.  The tubal ostia regions were unremarkable.   Endometrial currettings were scant in amount.   SPECIMENS:  the submucous fibroid was sent to pathology separately from the endometrial curettings.  PROCEDURE IN DETAIL:  The patient was reidentified in the preoperative hold area.  She received TED hose and PAS stockings for DVT prophylaxis.  In the operating room, the patient was placed in the dorsal lithotomy position and then an LMA anesthetic was introduced.  The patient's lower abdomen, vagina and perineum were  sterilely prepped with Betadine and the  patient's bladder was catheterized of urine.  She was sterilly draped.  An exam under anesthesia was performed.  A speculum was placed inside the vagina and a single-tooth tenaculum was placed on the anterior cervical lip.  A paracervical block was performed with a total of 10 mL of 1% lidocaine plain.  The uterus was sounded. The cervix was dilated to a #23 Pratt dilator.  The MyoSure hysteroscope was then inserted inside the uterine cavity under the continuous infusion of normal saline solution.   Findings are as noted above.  The Myosure XL device was used to resect the endometrial fibroid without difficulty, and it was sent to pathology.   The MyoSure hysteroscope was removed. The serrated and then sharp curettes were introduced into the uterine cavity and the endometrium was curetted in all 4 quadrants.   A scant amount of endometrial curettings was obtained.  This specimen was sent to Pathology.  The single-tooth tenaculum which had been placed on the anterior cervical lip was removed.     Hemostasis was good, and all of the vaginal instruments were removed.  The patient was awakened and escorted to the recovery room in stable condition after she was cleansed of Betadine.  There were no complications to the procedure.   All needle, instrument and sponge counts were correct.  Lenard Galloway, MD

## 2022-06-23 NOTE — Anesthesia Procedure Notes (Signed)
Procedure Name: LMA Insertion Date/Time: 06/23/2022 10:14 AM  Performed by: Lieutenant Diego, CRNAPre-anesthesia Checklist: Patient identified, Emergency Drugs available, Suction available and Patient being monitored Patient Re-evaluated:Patient Re-evaluated prior to induction Oxygen Delivery Method: Circle system utilized Preoxygenation: Pre-oxygenation with 100% oxygen Induction Type: IV induction Ventilation: Mask ventilation without difficulty LMA: LMA inserted LMA Size: 4.0 Number of attempts: 1 Placement Confirmation: positive ETCO2 and breath sounds checked- equal and bilateral Tube secured with: Tape Dental Injury: Teeth and Oropharynx as per pre-operative assessment

## 2022-06-23 NOTE — Transfer of Care (Signed)
Immediate Anesthesia Transfer of Care Note  Patient: Shreya Lacasse  Procedure(s) Performed: DILATATION & CURETTAGE/HYSTEROSCOPY WITH MYOSURE  Patient Location: PACU  Anesthesia Type:General  Level of Consciousness: drowsy  Airway & Oxygen Therapy: Patient Spontanous Breathing and Patient connected to face mask oxygen  Post-op Assessment: Report given to RN and Post -op Vital signs reviewed and stable  Post vital signs: Reviewed and stable  Last Vitals:  Vitals Value Taken Time  BP    Temp    Pulse 56 06/23/22 1100  Resp 13 06/23/22 1100  SpO2 100 % 06/23/22 1100    Last Pain:  Vitals:   06/23/22 0825  TempSrc:   PainSc: 0-No pain      Patients Stated Pain Goal: 3 (31/12/16 2446)  Complications: No notable events documented.

## 2022-06-23 NOTE — Progress Notes (Signed)
Update to History and Physical  Bleeding has essentially stopped with Aygestin.  Patient examined.  OK to proceed with surgery.

## 2022-06-23 NOTE — Anesthesia Postprocedure Evaluation (Signed)
Anesthesia Post Note  Patient: Carmen Edwards  Procedure(s) Performed: DILATATION & CURETTAGE/HYSTEROSCOPY WITH MYOSURE FIBROID RESECTION     Patient location during evaluation: PACU Anesthesia Type: General Level of consciousness: sedated Pain management: pain level controlled Vital Signs Assessment: post-procedure vital signs reviewed and stable Respiratory status: spontaneous breathing and respiratory function stable Cardiovascular status: stable Postop Assessment: no apparent nausea or vomiting Anesthetic complications: no   No notable events documented.  Last Vitals:  Vitals:   06/23/22 1145 06/23/22 1200  BP:  (!) 158/70  Pulse: (!) 55 (!) 53  Resp: 12 13  Temp:    SpO2: 100% 100%    Last Pain:  Vitals:   06/23/22 1200  TempSrc:   PainSc: 0-No pain                 Anselm Aumiller DANIEL

## 2022-06-23 NOTE — Discharge Instructions (Signed)
Hi Carmen Edwards,   I found and removed a fibroid.  I also did a curettage of the endometrium, which looked generally polypoid and thickened.  The fibroid and the endometrial curettings were sent for biopsy and analysis.   You did well today!  We will wait for the final reports to determine next steps in care to prevent future bleeding.   We may want to consider a Mirena IUD, which is a progesterone IUD.    I would like to see you in the office next Friday, July 7th.  Please call to make an appointment with me.   Josefa Half, MD

## 2022-06-24 ENCOUNTER — Encounter (HOSPITAL_COMMUNITY): Payer: Self-pay | Admitting: Obstetrics and Gynecology

## 2022-06-24 LAB — SURGICAL PATHOLOGY

## 2022-06-24 NOTE — Telephone Encounter (Signed)
I will fax over the clearance notes and ov note per Rosaria Ferries, PAC.

## 2022-06-30 NOTE — Progress Notes (Signed)
GYNECOLOGY  VISIT   HPI: 56 y.o.   Married  Caucasian  female   [id number] with Patient's last menstrual period was 06/28/2022.--spotting only. here for 1 week status post  DILATATION & CURETTAGE/HYSTEROSCOPY WITH MYOSURE FIBROID RESECTION. Patient has perimenopausal abnormal uterine bleeding.   Final pathology:  submucous fibroid and proliferative endometrium.   Now has light bleeding and uses one pad all day long. Some minimal cramping.  Took one Tylenol.  Some constipation.  Taking iron.   Has had irritability, which is now improved.   Currently on Aygestin once daily.   Sees her hematologist next week.   GYNECOLOGIC HISTORY: Patient's last menstrual period was 06/28/2022. Contraception:  coitus interruptus/Aygestin Menopausal hormone therapy:  none Last mammogram:  05-04-21 3D/Neg/BiRads1 Last pap smear:  06-17-21 ASCUS:Neg HR HPV, 06-05-18 Neg:Neg HR HPV,  05/19/17 Pap and HR HPV neg        OB History     Gravida  2   Para  1   Term  1   Preterm      AB  1   Living  1      SAB      IAB  1   Ectopic      Multiple      Live Births  1              Patient Active Problem List   Diagnosis Date Noted   Thrombocytosis 06/13/2022   Hypertension 10/10/2019   Hyperlipidemia 10/10/2019   Acute ST elevation myocardial infarction (STEMI) due to occlusion of left anterior descending (LAD) coronary artery (HCC)    Atypical squamous cell changes of undetermined significance (ASCUS) on cervical cytology with positive high risk human papilloma virus (HPV) 06/06/2014    Past Medical History:  Diagnosis Date   Abnormal Pap smear of cervix    Anemia    Coronary artery disease    Essential hypertension    Family history of adverse reaction to anesthesia    mother- has proble,ms with N/v   GERD (gastroesophageal reflux disease)    History of colonic polyps    History of migraine    Occasional, menustral cycle related   Hx of chlamydia infection 1989    Hyperlipidemia    STEMI (ST elevation myocardial infarction) ([place])    10/08/19 PCI/DESx1 to pLAD    Past Surgical History:  Procedure Laterality Date   BREAST BIOPSY Right    BREAST SURGERY  01/2016   Rt.breast bx--?[place]   COLONOSCOPY WITH PROPOFOL N/A 11/28/2014   Procedure: COLONOSCOPY WITH PROPOFOL;  Surgeon: Cleotis Nipper, MD;  [facility]: [facility];  Service: Endoscopy;  Laterality: N/A;   CORONARY/GRAFT ACUTE MI REVASCULARIZATION N/A 10/08/2019   Procedure: Coronary/Graft Acute MI Revascularization;  Surgeon: Burnell Blanks, MD;  Location: [facility];  Service: Cardiovascular;  Laterality: N/A;   DILATATION & CURETTAGE/HYSTEROSCOPY WITH MYOSURE N/A 06/23/2022   Procedure: DILATATION & CURETTAGE/HYSTEROSCOPY WITH MYOSURE FIBROID RESECTION;  Surgeon: Nunzio Cobbs, MD;  Location: [facility];  Service: Gynecology;  Laterality: N/A;   Fibroid resection  01/2012   Dr. Joan Flores   HYSTEROSCOPY  01/2012   Dr. Joan Flores   LEFT HEART CATH AND CORONARY ANGIOGRAPHY N/A 10/08/2019   Procedure: LEFT HEART CATH AND CORONARY ANGIOGRAPHY;  Surgeon: Burnell Blanks, MD;  Location: [facility];  Service: Cardiovascular;  Laterality: N/A;   SKIN LESION EXCISION     --actually frozen by dermatology--benign   TOE SURGERY  Toe nail removed    Current Outpatient Medications  Medication Sig Dispense Refill   acetaminophen (TYLENOL) 500 MG tablet Take 500 mg by mouth every 6 (six) hours as needed.     atenolol (TENORMIN) 50 MG tablet Take 50 mg by mouth daily with lunch.      atorvastatin (LIPITOR) 80 MG tablet TAKE 1 TABLET BY MOUTH AT BEDTIME . APPOINTMENT REQUIRED FOR FUTURE REFILLS (OVERDUE FROM 03/2021) 30 tablet 0   BAYER ASPIRIN EC LOW DOSE 81 MG EC tablet Take 1 tablet by mouth daily.     cholecalciferol (VITAMIN D) 1000 units tablet Take 2,000 Units by mouth daily.      Cyanocobalamin (VITAMIN B-12) 2500 MCG SUBL Take 2,500 mcg by mouth daily.     docusate  sodium (COLACE) 100 MG capsule Take 100 mg by mouth daily as needed for mild constipation.     ferrous sulfate 325 (65 FE) MG tablet Take 1 tablet (325 mg total) by mouth 2 (two) times daily with a meal. 60 tablet 1   ketoconazole (NIZORAL) 2 % shampoo Apply 1 Application topically daily as needed for irritation.     norethindrone (AYGESTIN) 5 MG tablet Take 1 tablet (5 mg total) by mouth daily. 30 tablet 0   omeprazole (PRILOSEC) 20 MG capsule Take 20 mg by mouth daily with lunch.      nitroGLYCERIN (NITROSTAT) 0.4 MG SL tablet Place 1 tablet (0.4 mg total) under the tongue every 5 (five) minutes as needed for chest pain. (Patient not taking: Reported on 06/20/2022) 25 tablet 0   No current facility-administered medications for this visit.     ALLERGIES: Amoxicillin  Family History  Problem Relation Age of Onset   Cancer Mother        multiple myeloma   Hyperlipidemia Mother    Macular degeneration Mother    Cancer - Colon Father    Hypertension Father    Hyperlipidemia Father    Heart disease Father    Hypertension Brother    Heart disease Brother 6       Heart attack   Cancer - Colon Maternal Aunt    Cancer - Colon Maternal Uncle    Cancer - Colon Maternal Grandmother    Heart disease Paternal Grandfather     Social History   Socioeconomic History   Marital status: Married    Spouse name: Not on file   Number of children: Not on file   Years of education: Not on file   Highest education level: Not on file  Occupational History   Not on file  Tobacco Use   Smoking status: Former    Packs/day: 0.00    Types: Cigarettes    Quit date: 10/08/2019    Years since quitting: 2.7   Smokeless tobacco: Never   Tobacco comments:    smokes 1/2 pack per year  Vaping Use   Vaping Use: Never used  Substance and Sexual Activity   Alcohol use: No    Alcohol/week: 0.0 standard drinks of alcohol   Drug use: No   Sexual activity: Yes    Partners: Male    Birth  control/protection: Other-see comments    Comment: withdrawal  Other Topics Concern   Not on file  Social History Narrative   Not on file   Social Determinants of Health   Financial Resource Strain: Not on file  Food Insecurity: Not on file  Transportation Needs: Not on file  Physical Activity: Not on file  Stress:  Not on file  Social Connections: Not on file  Intimate Partner Violence: Not on file    Review of Systems  All other systems reviewed and are negative.   PHYSICAL EXAMINATION:    BP (!) 142/82   Ht 5' 4.25" (1.632 m)   Wt 225 lb (102.1 kg)   LMP 06/28/2022 Comment: spotting only  BMI 38.32 kg/m     General appearance: alert, cooperative and appears stated age   Pelvic: External genitalia:  no lesions              Urethra:  normal appearing urethra with no masses, tenderness or lesions              Bartholins and Skenes: normal                 Vagina: normal appearing vagina with normal color and discharge, no lesions              Cervix: no lesions.  Very light blood noted.                 Bimanual Exam:  Uterus:  normal size, contour, position, consistency, mobility, non-tender              Adnexa: no mass, fullness, tenderness               Chaperone was present for exam:  Estill Bamberg, CMA  ASSESSMENT  Status post hysteroscopy with resection of fibroid, dilation and curettage.  Proliferative endometrium.  Currently on Aygestin 5 mg daily.  Undergoing work up for thrombocytosis.   PLAN  Surgical findings, procedure, and pathology report reviewed.  Resume all normal activity in 1 week.  Nothing per vagina and no submerging in water for one week.  Take Aygestin 5 mg, 1/2 tablet daily.  Our goal is to stop this medication.  CBC.  We discussed Mirena IUD risks and benefits.  Brochure to patient.  She will follow up with hematology in 3 days.  She will let me know if she would like to do Mirena or observational management.    An After Visit Summary was  printed and given to the patient.

## 2022-07-01 ENCOUNTER — Ambulatory Visit (INDEPENDENT_AMBULATORY_CARE_PROVIDER_SITE_OTHER): Payer: Commercial Managed Care - PPO | Admitting: Obstetrics and Gynecology

## 2022-07-01 ENCOUNTER — Encounter: Payer: Self-pay | Admitting: Obstetrics and Gynecology

## 2022-07-01 ENCOUNTER — Encounter (HOSPITAL_COMMUNITY): Payer: Commercial Managed Care - PPO | Admitting: Hematology

## 2022-07-01 VITALS — BP 142/82 | Ht 64.25 in | Wt 225.0 lb

## 2022-07-01 DIAGNOSIS — Z9889 Other specified postprocedural states: Secondary | ICD-10-CM

## 2022-07-01 DIAGNOSIS — D649 Anemia, unspecified: Secondary | ICD-10-CM

## 2022-07-01 LAB — CBC
HCT: 38 % (ref 35.0–45.0)
Hemoglobin: 11.3 g/dL — ABNORMAL LOW (ref 11.7–15.5)
MCH: 22.7 pg — ABNORMAL LOW (ref 27.0–33.0)
MCHC: 29.7 g/dL — ABNORMAL LOW (ref 32.0–36.0)
MCV: 76.5 fL — ABNORMAL LOW (ref 80.0–100.0)
MPV: 11 fL (ref 7.5–12.5)
Platelets: 693 10*3/uL — ABNORMAL HIGH (ref 140–400)
RBC: 4.97 10*6/uL (ref 3.80–5.10)
RDW: 16.4 % — ABNORMAL HIGH (ref 11.0–15.0)
WBC: 11.2 10*3/uL — ABNORMAL HIGH (ref 3.8–10.8)

## 2022-07-04 ENCOUNTER — Inpatient Hospital Stay (HOSPITAL_COMMUNITY): Payer: Commercial Managed Care - PPO | Attending: Hematology | Admitting: Hematology

## 2022-07-04 VITALS — BP 158/88 | HR 68 | Temp 97.9°F | Resp 18 | Ht 65.0 in | Wt 214.8 lb

## 2022-07-04 DIAGNOSIS — D471 Chronic myeloproliferative disease: Secondary | ICD-10-CM | POA: Diagnosis not present

## 2022-07-04 DIAGNOSIS — D45 Polycythemia vera: Secondary | ICD-10-CM | POA: Insufficient documentation

## 2022-07-04 DIAGNOSIS — D75839 Thrombocytosis, unspecified: Secondary | ICD-10-CM

## 2022-07-04 DIAGNOSIS — Z79899 Other long term (current) drug therapy: Secondary | ICD-10-CM | POA: Insufficient documentation

## 2022-07-04 NOTE — Progress Notes (Signed)
Carmen Edwards, [name]   CLINIC:  Medical Oncology/Hematology  PCP:  London Pepper, MD [address] / [place] [address]  [phone]  REASON FOR VISIT:  Follow-up for polycythemia vera  PRIOR THERAPY: none  CURRENT THERAPY: Aspirin 81 mg daily  INTERVAL HISTORY:  [name], a 56 y.o. female, returns for routine follow-up for her thrombocytosis. Carmen Edwards was last seen on 06/13/2022.  Today she reports feeling good. She denies history of DVT and PE. She takes 81 mg aspirin daily. She reports a MI in 2020, and she denies history of CVA. She reports her father and brother both have history of MI.   REVIEW OF SYSTEMS:  Review of Systems  Constitutional:  Negative for appetite change and fatigue.  Gastrointestinal:  Positive for constipation.  Psychiatric/Behavioral:  Positive for sleep disturbance. The patient is nervous/anxious.   All other systems reviewed and are negative.   PAST MEDICAL/SURGICAL HISTORY:  Past Medical History:  Diagnosis Date   Abnormal Pap smear of cervix    Anemia    Coronary artery disease    Essential hypertension    Family history of adverse reaction to anesthesia    mother- has proble,ms with N/v   GERD (gastroesophageal reflux disease)    History of colonic polyps    History of migraine    Occasional, menustral cycle related   Hx of chlamydia infection 1989   Hyperlipidemia    STEMI (ST elevation myocardial infarction) ([place])    10/08/19 PCI/DESx1 to pLAD   Past Surgical History:  Procedure Laterality Date   BREAST BIOPSY Right    BREAST SURGERY  01/2016   Rt.breast bx--?[place]   COLONOSCOPY WITH PROPOFOL N/A 11/28/2014   Procedure: COLONOSCOPY WITH PROPOFOL;  Surgeon: Cleotis Nipper, MD;  [facility];  Service: Endoscopy;  Laterality: N/A;   CORONARY/GRAFT ACUTE MI REVASCULARIZATION N/A 10/08/2019   Procedure: Coronary/Graft Acute MI  Revascularization;  Surgeon: Burnell Blanks, MD;  Location: [facility];  Service: Cardiovascular;  Laterality: N/A;   DILATATION & CURETTAGE/HYSTEROSCOPY WITH MYOSURE N/A 06/23/2022   Procedure: DILATATION & CURETTAGE/HYSTEROSCOPY WITH MYOSURE FIBROID RESECTION;  Surgeon: Nunzio Cobbs, MD;  [facility];  Service: Gynecology;  Laterality: N/A;   Fibroid resection  01/2012   Dr. Joan Flores   HYSTEROSCOPY  01/2012   Dr. Joan Flores   LEFT HEART CATH AND CORONARY ANGIOGRAPHY N/A 10/08/2019   Procedure: LEFT HEART CATH AND CORONARY ANGIOGRAPHY;  Surgeon: Burnell Blanks, MD;  Location: [facility];  Service: Cardiovascular;  Laterality: N/A;   SKIN LESION EXCISION     --actually frozen by dermatology--benign   TOE SURGERY     Toe nail removed    SOCIAL HISTORY:  Social History   Socioeconomic History   Marital status: Married    Spouse name: Not on file   Number of children: Not on file   Years of education: Not on file   Highest education level: Not on file  Occupational History   Not on file  Tobacco Use   Smoking status: Former    Packs/day: 0.00    Types: Cigarettes    Quit date: 10/08/2019    Years since quitting: 2.7   Smokeless tobacco: Never   Tobacco comments:    smokes 1/2 pack per year  Vaping Use   Vaping Use: Never used  Substance and Sexual Activity   Alcohol use: No  Alcohol/week: 0.0 standard drinks of alcohol   Drug use: No   Sexual activity: Yes    Partners: Male    Birth control/protection: Other-see comments    Comment: withdrawal  Other Topics Concern   Not on file  Social History Narrative   Not on file   Social Determinants of Health   Financial Resource Strain: Not on file  Food Insecurity: Not on file  Transportation Needs: Not on file  Physical Activity: Not on file  Stress: Not on file  Social Connections: Not on file  Intimate Partner Violence: Not on file    FAMILY HISTORY:  Family History   Problem Relation Age of Onset   Cancer Mother        multiple myeloma   Hyperlipidemia Mother    Macular degeneration Mother    Cancer - Colon Father    Hypertension Father    Hyperlipidemia Father    Heart disease Father    Hypertension Brother    Heart disease Brother 68       Heart attack   Cancer - Colon Maternal Aunt    Cancer - Colon Maternal Uncle    Cancer - Colon Maternal Grandmother    Heart disease Paternal Grandfather     CURRENT MEDICATIONS:  Current Outpatient Medications  Medication Sig Dispense Refill   acetaminophen (TYLENOL) 500 MG tablet Take 500 mg by mouth every 6 (six) hours as needed.     atenolol (TENORMIN) 50 MG tablet Take 50 mg by mouth daily with lunch.      atorvastatin (LIPITOR) 80 MG tablet TAKE 1 TABLET BY MOUTH AT BEDTIME . APPOINTMENT REQUIRED FOR FUTURE REFILLS (OVERDUE FROM 03/2021) 30 tablet 0   BAYER ASPIRIN EC LOW DOSE 81 MG EC tablet Take 1 tablet by mouth daily.     cholecalciferol (VITAMIN D) 1000 units tablet Take 2,000 Units by mouth daily.      Cyanocobalamin (VITAMIN B-12) 2500 MCG SUBL Take 2,500 mcg by mouth daily.     docusate sodium (COLACE) 100 MG capsule Take 100 mg by mouth daily as needed for mild constipation.     ferrous sulfate 325 (65 FE) MG tablet Take 1 tablet (325 mg total) by mouth 2 (two) times daily with a meal. 60 tablet 1   ketoconazole (NIZORAL) 2 % shampoo Apply 1 Application topically daily as needed for irritation.     norethindrone (AYGESTIN) 5 MG tablet Take 1 tablet (5 mg total) by mouth daily. (Patient taking differently: Take 5 mg by mouth daily. Take 1/2 tablet) 30 tablet 0   omeprazole (PRILOSEC) 20 MG capsule Take 20 mg by mouth daily with lunch.      nitroGLYCERIN (NITROSTAT) 0.4 MG SL tablet Place 1 tablet (0.4 mg total) under the tongue every 5 (five) minutes as needed for chest pain. (Patient not taking: Reported on 06/20/2022) 25 tablet 0   No current facility-administered medications for this visit.     ALLERGIES:  Allergies  Allergen Reactions   Amoxicillin Nausea And Vomiting and Other (See Comments)    (high doses) Did it involve swelling of the face/tongue/throat, SOB, or low BP? No Did it involve sudden or severe rash/hives, skin peeling, or any reaction on the inside of your mouth or nose? No Did you need to seek medical attention at a hospital or doctor's office? No When did it last happen? 2000      If all above answers are "NO", may proceed with cephalosporin use.  PHYSICAL EXAM:  Performance status (ECOG): 1 - Symptomatic but completely ambulatory  Vitals:   07/04/22 1547  BP: (!) 158/88  Pulse: 68  Resp: 18  Temp: 97.9 F (36.6 C)  SpO2: 98%   Wt Readings from Last 3 Encounters:  07/04/22 214 lb 12.8 oz (97.4 kg)  07/01/22 225 lb (102.1 kg)  06/23/22 217 lb (98.4 kg)   Physical Exam Vitals reviewed.  Constitutional:      Appearance: Normal appearance. She is obese.  Cardiovascular:     Rate and Rhythm: Normal rate and regular rhythm.     Pulses: Normal pulses.     Heart sounds: Normal heart sounds.  Pulmonary:     Effort: Pulmonary effort is normal.     Breath sounds: Normal breath sounds.  Neurological:     General: No focal deficit present.     Mental Status: She is alert and oriented to person, place, and time.  Psychiatric:        Mood and Affect: Mood normal.        Behavior: Behavior normal.     LABORATORY DATA:  I have reviewed the labs as listed.     Latest Ref Rng & Units 07/01/2022    8:34 AM 06/22/2022    2:36 PM 06/15/2022   12:56 PM  CBC  WBC 3.8 - 10.8 Thousand/uL 11.2  10.9  13.8   Hemoglobin 11.7 - 15.5 g/dL 11.3  9.9  11.3   Hematocrit 35.0 - 45.0 % 38.0  33.4  36.7   Platelets 140 - 400 Thousand/uL 693  653  904       Latest Ref Rng & Units 06/22/2022    2:36 PM 10/10/2019    5:03 AM 10/09/2019    4:21 AM  CMP  Glucose 70 - 99 mg/dL 96  82  82   BUN 6 - 20 mg/dL _0 Creatinine 0.44 - 1.00 mg/dL 0.70  0.74   0.56   Sodium 135 - 145 mmol/L 142  137  136   Potassium 3.5 - 5.1 mmol/L 3.7  3.0  4.1   Chloride 98 - 111 mmol/L 114  106  108   CO2 22 - 32 mmol/L _1 Calcium 8.9 - 10.3 mg/dL 8.9  8.6  8.6   Total Protein 6.5 - 8.1 g/dL   6.1   Total Bilirubin 0.3 - 1.2 mg/dL   1.6   Alkaline Phos 38 - 126 U/L   75   AST 15 - 41 U/L   38   ALT 0 - 44 U/L   23       Component Value Date/Time   RBC 4.97 07/01/2022 0834   MCV 76.5 (L) 07/01/2022 0834   MCH 22.7 (L) 07/01/2022 0834   MCHC 29.7 (L) 07/01/2022 0834   RDW 16.4 (H) 07/01/2022 0834   LYMPHSABS 2.3 06/13/2022 1331   MONOABS 0.8 06/13/2022 1331   EOSABS 0.2 06/13/2022 1331   BASOSABS 0.1 06/13/2022 1331    DIAGNOSTIC IMAGING:  I have independently reviewed the scans and discussed with the patient. No results found.   ASSESSMENT:  JAK2 positive polycythemia vera: - Patient evaluated for thrombocytosis ranging between 421-741 since February 2013 - No aquagenic pruritus/vasomotor symptoms/thrombosis/B symptoms - She reports irregular menses for the last 3 to 4 years, menstrual bleeding continuously for the last 40 days.  She is scheduled for hysteroscopy and D&C on 07/05/2022 - She also developed  microcytosis for the last year with low normal hemoglobin, indicating iron deficiency state. - She is on baby aspirin since MI on 10/08/2019 - JAK2 V617F test positive on 06/13/2022    Social/family history: - She worked Marketing executive jobs.  No exposure to chemicals.  Smoked half pack per day and quit about 10 to 15 years ago. - Mother had multiple myeloma.  Maternal aunt and uncle had colon cancer.  Maternal grandmother had uterine cancer.  Maternal grandfather had prostate cancer.   PLAN:  JAK2 V617F positive polycythemia vera: - We discussed results of JAK2 V617F testing which was positive. - Based on risk stratification, she is less than 50 years old and no history of prior thrombosis, making her low risk. - We discussed management  of low risk PV with low-dose aspirin. - I think she is anemic from vaginal bleeding.  Her ferritin is severely low.  She is taking iron tablet twice daily.  She is having constipation. - I have told her to cut back iron tablet to once daily. - We discussed that most patients with low risk PV do not need cytoreductive therapy.  Cytoreductive therapy is usually reserved for uncontrolled PV associated symptoms, progressive increase of leukocyte and/or platelet counts, symptomatic or progressive splenomegaly and poor tolerance of phlebotomy. - She has elevated platelet count, partly from low iron levels and partly from P vera. - I have recommended continuing aspirin and recheck her counts in 6 weeks. - If there is any significant worsening of the counts, will consider hydroxyurea. - We will consider NGS testing.  We will also consider baseline bone marrow test with cytogenetics in the future.  2.  Vaginal bleeding: - She had endometrial biopsy which was negative for malignancy. - She was recommended to have an IUD placed.  She is being tapered off norethindrone.  Orders placed this encounter:  No orders of the defined types were placed in this encounter.    Derek Jack, MD [place] [phone]   I, Thana Ates, am acting as a scribe for Dr. Derek Jack.  I, Derek Jack MD, have reviewed the above documentation for accuracy and completeness, and I agree with the above.

## 2022-07-04 NOTE — Patient Instructions (Addendum)
[place] at [facility] Discharge Instructions  You were seen and examined today by Dr. Delton Coombes.  Dr. Delton Coombes discussed your most recent lab work and it shows that you platelets are elevated. Continue taking aspirin. Decrease the Iron to once daily.   We will recheck your labs at your next appointment.   Follow-up as scheduled in 6 weeks.    Thank you for choosing [place] at [facility] to provide your oncology and hematology care.  To afford each patient quality time with our provider, please arrive at least 15 minutes before your scheduled appointment time.   If you have a lab appointment with the [facility] please come in thru the Main Entrance and check in at the main information desk.  You need to re-schedule your appointment should you arrive 10 or more minutes late.  We strive to give you quality time with our providers, and arriving late affects you and other patients whose appointments are after yours.  Also, if you no show three or more times for appointments you may be dismissed from the clinic at the providers discretion.     Again, thank you for choosing [facility].  Our hope is that these requests will decrease the amount of time that you wait before being seen by our physicians.       _____________________________________________________________  Should you have questions after your visit to [facility], please contact our office at [phone] and follow the prompts.  Our office hours are 8:00 a.m. and 4:30 p.m. Monday - Friday.  Please note that voicemails left after 4:00 p.m. may not be returned until the following business day.  We are closed weekends and major holidays.  You do have access to a nurse 24-7, just call the main number to the clinic [phone] and do not press any options, hold on the line and a nurse will answer the phone.    For prescription refill requests, have your  pharmacy contact our office and allow 72 hours.    Due to Covid, you will need to wear a mask upon entering the hospital. If you do not have a mask, a mask will be given to you at the Main Entrance upon arrival. For doctor visits, patients may have 1 support person age 80 or older with them. For treatment visits, patients can not have anyone with them due to social distancing guidelines and our immunocompromised population.

## 2022-07-06 ENCOUNTER — Encounter: Payer: Commercial Managed Care - PPO | Admitting: Obstetrics and Gynecology

## 2022-07-07 ENCOUNTER — Telehealth: Payer: Self-pay

## 2022-07-07 DIAGNOSIS — Z30430 Encounter for insertion of intrauterine contraceptive device: Secondary | ICD-10-CM

## 2022-07-07 NOTE — Telephone Encounter (Signed)
Encounter reviewed and closed.  

## 2022-07-07 NOTE — Telephone Encounter (Signed)
Spoke with patient and informed her Ok to proceed with Mirena IUD per Dr. Quincy Simmonds. Order placed and appointment desk will be calling her to arrange.

## 2022-07-07 NOTE — Telephone Encounter (Signed)
Ok to order Mirena IUD insertion.  Thank you!

## 2022-07-07 NOTE — Telephone Encounter (Signed)
Appt for IUD insertion scheduled 07/20/22.

## 2022-07-07 NOTE — Telephone Encounter (Signed)
Patient reports that her physician from the [facility] called her back and it is okay for her to proceed with IUD.  She said she spoke with insurance co and it is covered and she would like to proceed and get this scheduled.  Ok to order?

## 2022-07-12 NOTE — Progress Notes (Signed)
GYNECOLOGY  VISIT   HPI: 56 y.o.   Married  Caucasian  female   [id number] with Patient's last menstrual period was 06/28/2022.   here for Mirena IUD insertion. Still having pink spotting.  Taking Aygestin 2.5 mg daily.   She has been approved for a Mirena IUD by her oncologist.  She has thrombocytosis and myeloproliferative neoplasm.  She is now on daily ASA.   UPT negative.  GYNECOLOGIC HISTORY: Patient's last menstrual period was 06/28/2022. Contraception:  coitus interruptus/Aygestin Menopausal hormone therapy:  Aygestin 59m--1/2 tab.qd Last mammogram:    05-04-21 3D/Neg/BiRads1 Last pap smear:  06-17-21 ASCUS:Neg HR HPV, 06-05-18 Neg:Neg HR HPV,  05/19/17 Pap and HR HPV neg               OB History     Gravida  2   Para  1   Term  1   Preterm      AB  1   Living  1      SAB      IAB  1   Ectopic      Multiple      Live Births  1              Patient Active Problem List   Diagnosis Date Noted   Myeloproliferative neoplasm ([facility] 07/04/2022   Thrombocytosis 06/13/2022   Hypertension 10/10/2019   Hyperlipidemia 10/10/2019   Acute ST elevation myocardial infarction (STEMI) due to occlusion of left anterior descending (LAD) coronary artery (HCC)    Atypical squamous cell changes of undetermined significance (ASCUS) on cervical cytology with positive high risk human papilloma virus (HPV) 06/06/2014    Past Medical History:  Diagnosis Date   Abnormal Pap smear of cervix    Anemia    Coronary artery disease    Essential hypertension    Family history of adverse reaction to anesthesia    mother- has proble,ms with N/v   GERD (gastroesophageal reflux disease)    History of colonic polyps    History of migraine    Occasional, menustral cycle related   Hx of chlamydia infection 1989   Hyperlipidemia    PV (polycythemia vera) ([facility] 05/26/2022   STEMI (ST elevation myocardial infarction) ([facility]    10/08/19 PCI/DESx1 to pLAD    Past Surgical History:   Procedure Laterality Date   BREAST BIOPSY Right    BREAST SURGERY  01/2016   Rt.breast bx--?[place]  COLONOSCOPY WITH PROPOFOL N/A 11/28/2014   Procedure: COLONOSCOPY WITH PROPOFOL;  Surgeon: RCleotis Nipper MD;  [facility];  Service: Endoscopy;  Laterality: N/A;   CORONARY/GRAFT ACUTE MI REVASCULARIZATION N/A 10/08/2019   Procedure: Coronary/Graft Acute MI Revascularization;  Surgeon: MBurnell Blanks MD;  [facility];  Service: Cardiovascular;  Laterality: N/A;   DILATATION & CURETTAGE/HYSTEROSCOPY WITH MYOSURE N/A 06/23/2022   Procedure: DILATATION & CURETTAGE/HYSTEROSCOPY WITH MYOSURE FIBROID RESECTION;  Surgeon: ANunzio Cobbs MD;  [facility];  Service: Gynecology;  Laterality: N/A;   Fibroid resection  01/2012   Dr. RJoan Flores  HYSTEROSCOPY  01/2012   Dr. RJoan Flores  LEFT HEART CATH AND CORONARY ANGIOGRAPHY N/A 10/08/2019   Procedure: LEFT HEART CATH AND CORONARY ANGIOGRAPHY;  Surgeon: MBurnell Blanks MD;  [facility];  Service: Cardiovascular;  Laterality: N/A;   SKIN LESION EXCISION     --actually frozen by dermatology--benign   TOE SURGERY     Toe nail removed    Current  Outpatient Medications  Medication Sig Dispense Refill   acetaminophen (TYLENOL) 500 MG tablet Take 500 mg by mouth every 6 (six) hours as needed.     atenolol (TENORMIN) 50 MG tablet Take 50 mg by mouth daily with lunch.      atorvastatin (LIPITOR) 80 MG tablet TAKE 1 TABLET BY MOUTH AT BEDTIME . APPOINTMENT REQUIRED FOR FUTURE REFILLS (OVERDUE FROM 03/2021) 30 tablet 0   BAYER ASPIRIN EC LOW DOSE 81 MG EC tablet Take 1 tablet by mouth daily.     cholecalciferol (VITAMIN D) 1000 units tablet Take 2,000 Units by mouth daily.      Cyanocobalamin (VITAMIN B-12) 2500 MCG SUBL Take 2,500 mcg by mouth daily.     docusate sodium (COLACE) 100 MG capsule Take 100 mg by mouth daily as needed for mild constipation.     ferrous sulfate 325 (65 FE) MG  tablet Take 1 tablet (325 mg total) by mouth 2 (two) times daily with a meal. (Patient taking differently: Take 325 mg by mouth daily.) 60 tablet 1   ketoconazole (NIZORAL) 2 % shampoo Apply 1 Application topically daily as needed for irritation.     Lactobacillus (PROBIOTIC ACIDOPHILUS PO) Take by mouth as needed.     norethindrone (AYGESTIN) 5 MG tablet Take 1 tablet (5 mg total) by mouth daily. (Patient taking differently: Take 5 mg by mouth daily. Take 1/2 tablet) 30 tablet 0   omeprazole (PRILOSEC) 20 MG capsule Take 20 mg by mouth daily with lunch.      nitroGLYCERIN (NITROSTAT) 0.4 MG SL tablet Place 1 tablet (0.4 mg total) under the tongue every 5 (five) minutes as needed for chest pain. (Patient not taking: Reported on 06/20/2022) 25 tablet 0   No current facility-administered medications for this visit.     ALLERGIES: Amoxicillin  Family History  Problem Relation Age of Onset   Cancer Mother        multiple myeloma   Hyperlipidemia Mother    Macular degeneration Mother    Cancer - Colon Father    Hypertension Father    Hyperlipidemia Father    Heart disease Father    Hypertension Brother    Heart disease Brother 48       Heart attack   Cancer - Colon Maternal Aunt    Cancer - Colon Maternal Uncle    Cancer - Colon Maternal Grandmother    Heart disease Paternal Grandfather     Social History   Socioeconomic History   Marital status: Married    Spouse name: Not on file   Number of children: Not on file   Years of education: Not on file   Highest education level: Not on file  Occupational History   Not on file  Tobacco Use   Smoking status: Former    Packs/day: 0.00    Types: Cigarettes    Quit date: 10/08/2019    Years since quitting: 2.7   Smokeless tobacco: Never   Tobacco comments:    smokes 1/2 pack per year  Vaping Use   Vaping Use: Never used  Substance and Sexual Activity   Alcohol use: No    Alcohol/week: 0.0 standard drinks of alcohol   Drug use:  No   Sexual activity: Yes    Partners: Male    Birth control/protection: Other-see comments    Comment: withdrawal  Other Topics Concern   Not on file  Social History Narrative   Not on file   Social Determinants of Health  Financial Resource Strain: Not on file  Food Insecurity: Not on file  Transportation Needs: Not on file  Physical Activity: Not on file  Stress: Not on file  Social Connections: Not on file  Intimate Partner Violence: Not on file    Review of Systems  All other systems reviewed and are negative.   PHYSICAL EXAMINATION:    BP (!) 158/84   Ht 5' 4.5" (1.638 m)   Wt 225 lb (102.1 kg)   LMP 06/28/2022 Comment: spotting only  BMI 38.02 kg/m     General appearance: alert, cooperative and appears stated age   Pelvic: External genitalia:  no lesions              Urethra:  normal appearing urethra with no masses, tenderness or lesions              Bartholins and Skenes: normal                 Vagina: normal appearing vagina with normal color and discharge, no lesions              Cervix: no lesions                Bimanual Exam:  Uterus:  normal size, contour, position, consistency, mobility, non-tender              Adnexa: no mass, fullness, tenderness       Mirena IUD insertion. Lot [id number], exp Aug 2025.  Consent done.  Hibiclens prep.  Local 1% lidocaine 10 cc, lot [id number]   , Exp 12/27/23 Tenaculum to anterior cervical lip.  Uterus sounded to 7.5 cm.  Mirena IUD placed without difficulty.  Strings trimmed and shown to patient.  Repeat bimanual exam, no change.   Chaperone was present for exam:  Sharee Pimple, RN  ASSESSMENT  Encounter for Mirena IUD insertion.  Myeloproliferative disorder.   PLAN  IUD insertion precautions given.  Back up protection for 2 weeks.  Reduce Aygestin to 1.25 mg daily until prescription is gone.  IUD card to patient.  FU in 4 weeks for IUD check up.     An After Visit Summary was printed and given to the  patient.

## 2022-07-14 ENCOUNTER — Encounter: Payer: Commercial Managed Care - PPO | Admitting: Obstetrics and Gynecology

## 2022-07-20 ENCOUNTER — Encounter: Payer: Self-pay | Admitting: Obstetrics and Gynecology

## 2022-07-20 ENCOUNTER — Ambulatory Visit (INDEPENDENT_AMBULATORY_CARE_PROVIDER_SITE_OTHER): Payer: Commercial Managed Care - PPO | Admitting: Obstetrics and Gynecology

## 2022-07-20 VITALS — BP 158/84 | Ht 64.5 in | Wt 225.0 lb

## 2022-07-20 DIAGNOSIS — Z01812 Encounter for preprocedural laboratory examination: Secondary | ICD-10-CM

## 2022-07-20 DIAGNOSIS — Z30430 Encounter for insertion of intrauterine contraceptive device: Secondary | ICD-10-CM

## 2022-07-20 DIAGNOSIS — N8500 Endometrial hyperplasia, unspecified: Secondary | ICD-10-CM | POA: Diagnosis not present

## 2022-07-20 DIAGNOSIS — D471 Chronic myeloproliferative disease: Secondary | ICD-10-CM

## 2022-07-20 LAB — PREGNANCY, URINE: Preg Test, Ur: NEGATIVE

## 2022-07-20 NOTE — Patient Instructions (Signed)
Intrauterine Device Insertion, Care After This sheet gives you information about how to care for yourself after your procedure. Your health care provider may also give you more specific instructions. If you have problems or questions, contact your health care provider. What can I expect after the procedure? After the procedure, it is common to have: Cramps and pain in the abdomen. Bleeding. It may be light or heavy. This may last for a few days. Lower back pain. Dizziness. Headaches. Nausea. Follow these instructions at home:  Before resuming sexual activity, check to make sure that you can feel the IUD string or strings. You should be able to feel the end of the string below the opening of your cervix. If your IUD string is in place, you may resume sexual activity. If you had a hormonal IUD inserted more than 7 days after your most recent period started, you will need to use a backup method of birth control for 7 days after IUD insertion. Ask your health care provider whether this applies to you. Continue to check that the IUD is still in place by feeling for the strings after every menstrual period, or once a month. An IUD will not protect you from sexually transmitted infections (STIs). Use methods to prevent the exchange of body fluids between partners (barrier protection) every time you have sex. Barrier protection can be used during oral, vaginal, or anal sex. Commonly used barrier methods include: Female condom. Female condom. Dental dam. Take over-the-counter and prescription medicines only as told by your health care provider. Keep all follow-up visits as told by your health care provider. This is important. Contact a health care provider if: You feel light-headed or weak. You have any of the following problems with your IUD string or strings: The string bothers or hurts you or your sexual partner. You cannot feel the string. The string has gotten longer. You can feel the IUD in  your vagina. You think you may be pregnant, or you miss your menstrual period. You think you may have a sexually transmitted infection (STI). Get help right away if: You have flu-like symptoms, such as tiredness (fatigue) and muscle aches. You have a fever and chills. You have bleeding that is heavier or lasts longer than a normal menstrual cycle. You have abnormal or bad-smelling discharge from your vagina. You develop abdominal pain that is new, is getting worse, or is not in the same area of earlier cramping and pain. You have pain during sexual activity. Summary After the procedure, it is common to have cramps and pain in the abdomen. It is also common to have light bleeding or heavier bleeding that is like your menstrual period. Continue to check that the IUD is still in place by feeling for the strings after every menstrual period, or once a month. Keep all follow-up visits as told by your health care provider. This is important. Contact your health care provider if you have problems with your IUD strings, such as the string getting longer or bothering you or your sexual partner. This information is not intended to replace advice given to you by your health care provider. Make sure you discuss any questions you have with your health care provider. Document Revised: 12/03/2019 Document Reviewed: 12/03/2019 Elsevier Patient Education  [place].

## 2022-08-15 ENCOUNTER — Inpatient Hospital Stay: Payer: Commercial Managed Care - PPO | Attending: Hematology

## 2022-08-15 DIAGNOSIS — D471 Chronic myeloproliferative disease: Secondary | ICD-10-CM

## 2022-08-15 DIAGNOSIS — D45 Polycythemia vera: Secondary | ICD-10-CM | POA: Diagnosis present

## 2022-08-15 DIAGNOSIS — Z87891 Personal history of nicotine dependence: Secondary | ICD-10-CM | POA: Insufficient documentation

## 2022-08-15 DIAGNOSIS — I10 Essential (primary) hypertension: Secondary | ICD-10-CM | POA: Insufficient documentation

## 2022-08-15 DIAGNOSIS — Z800 Family history of malignant neoplasm of digestive organs: Secondary | ICD-10-CM | POA: Insufficient documentation

## 2022-08-15 DIAGNOSIS — Z807 Family history of other malignant neoplasms of lymphoid, hematopoietic and related tissues: Secondary | ICD-10-CM | POA: Insufficient documentation

## 2022-08-15 DIAGNOSIS — D75839 Thrombocytosis, unspecified: Secondary | ICD-10-CM

## 2022-08-15 LAB — CBC WITH DIFFERENTIAL/PLATELET
Abs Immature Granulocytes: 0.03 10*3/uL (ref 0.00–0.07)
Basophils Absolute: 0.1 10*3/uL (ref 0.0–0.1)
Basophils Relative: 1 %
Eosinophils Absolute: 0.3 10*3/uL (ref 0.0–0.5)
Eosinophils Relative: 3 %
HCT: 39.8 % (ref 36.0–46.0)
Hemoglobin: 11.9 g/dL — ABNORMAL LOW (ref 12.0–15.0)
Immature Granulocytes: 0 %
Lymphocytes Relative: 28 %
Lymphs Abs: 2.4 10*3/uL (ref 0.7–4.0)
MCH: 24.2 pg — ABNORMAL LOW (ref 26.0–34.0)
MCHC: 29.9 g/dL — ABNORMAL LOW (ref 30.0–36.0)
MCV: 81.1 fL (ref 80.0–100.0)
Monocytes Absolute: 0.7 10*3/uL (ref 0.1–1.0)
Monocytes Relative: 8 %
Neutro Abs: 5.1 10*3/uL (ref 1.7–7.7)
Neutrophils Relative %: 60 %
Platelets: 723 10*3/uL — ABNORMAL HIGH (ref 150–400)
RBC: 4.91 MIL/uL (ref 3.87–5.11)
RDW: 19.5 % — ABNORMAL HIGH (ref 11.5–15.5)
WBC: 8.6 10*3/uL (ref 4.0–10.5)
nRBC: 0 % (ref 0.0–0.2)

## 2022-08-15 LAB — FERRITIN: Ferritin: 11 ng/mL (ref 11–307)

## 2022-08-15 LAB — IRON AND TIBC
Iron: 27 ug/dL — ABNORMAL LOW (ref 28–170)
Saturation Ratios: 7 % — ABNORMAL LOW (ref 10.4–31.8)
TIBC: 383 ug/dL (ref 250–450)
UIBC: 356 ug/dL

## 2022-08-15 LAB — LACTATE DEHYDROGENASE: LDH: 170 U/L (ref 98–192)

## 2022-08-17 ENCOUNTER — Inpatient Hospital Stay: Payer: Commercial Managed Care - PPO | Admitting: Hematology

## 2022-08-17 VITALS — BP 136/86 | HR 67 | Temp 97.3°F | Resp 18 | Ht 64.5 in | Wt 216.4 lb

## 2022-08-17 DIAGNOSIS — D75839 Thrombocytosis, unspecified: Secondary | ICD-10-CM

## 2022-08-17 DIAGNOSIS — D45 Polycythemia vera: Secondary | ICD-10-CM | POA: Diagnosis not present

## 2022-08-17 NOTE — Progress Notes (Signed)
Carmen Edwards, [place] [address]   CLINIC:  Medical Oncology/Hematology  PCP:  London Pepper, MD [address] / [place] [address]  [phone]  REASON FOR VISIT:  Follow-up for polycythemia vera  PRIOR THERAPY: none  CURRENT THERAPY: Aspirin 81 mg daily  INTERVAL HISTORY:  Carmen Edwards, a 56 y.o. female, here for follow-up of polycythemia vera.  She is now status post IUD placement.  No bleeding since IUD placed.  Taking aspirin 81 mg daily.  She reports that sometimes after shower and sometimes at nighttime she has itching of the upper back and legs lasting less than 5 minutes.  No vasomotor symptoms.  REVIEW OF SYSTEMS:  Review of Systems  Constitutional:  Negative for appetite change and fatigue.  Gastrointestinal:  Positive for constipation.  Neurological:  Positive for headaches.  Psychiatric/Behavioral:  Positive for sleep disturbance. The patient is not nervous/anxious.   All other systems reviewed and are negative.   PAST MEDICAL/SURGICAL HISTORY:  Past Medical History:  Diagnosis Date   Abnormal Pap smear of cervix    Anemia    Coronary artery disease    Essential hypertension    Family history of adverse reaction to anesthesia    mother- has proble,ms with N/v   GERD (gastroesophageal reflux disease)    History of colonic polyps    History of migraine    Occasional, menustral cycle related   Hx of chlamydia infection 1989   Hyperlipidemia    PV (polycythemia vera) ([place]) 05/26/2022   STEMI (ST elevation myocardial infarction) ([place])    10/08/19 PCI/DESx1 to pLAD   Past Surgical History:  Procedure Laterality Date   BREAST BIOPSY Right    BREAST SURGERY  01/2016   Rt.breast bx--?[place]   COLONOSCOPY WITH PROPOFOL N/A 11/28/2014   Procedure: COLONOSCOPY WITH PROPOFOL;  Surgeon: Cleotis Nipper, MD;  [facility];  Service: Endoscopy;  Laterality: N/A;   CORONARY/GRAFT  ACUTE MI REVASCULARIZATION N/A 10/08/2019   Procedure: Coronary/Graft Acute MI Revascularization;  Surgeon: Burnell Blanks, MD;  Location: [facility];  Service: Cardiovascular;  Laterality: N/A;   DILATATION & CURETTAGE/HYSTEROSCOPY WITH MYOSURE N/A 06/23/2022   Procedure: DILATATION & CURETTAGE/HYSTEROSCOPY WITH MYOSURE FIBROID RESECTION;  Surgeon: Nunzio Cobbs, MD;  [facility];  Service: Gynecology;  Laterality: N/A;   Fibroid resection  01/2012   Dr. Joan Flores   HYSTEROSCOPY  01/2012   Dr. Joan Flores   LEFT HEART CATH AND CORONARY ANGIOGRAPHY N/A 10/08/2019   Procedure: LEFT HEART CATH AND CORONARY ANGIOGRAPHY;  Surgeon: Burnell Blanks, MD;  Location: [facility];  Service: Cardiovascular;  Laterality: N/A;   SKIN LESION EXCISION     --actually frozen by dermatology--benign   TOE SURGERY     Toe nail removed    SOCIAL HISTORY:  Social History   Socioeconomic History   Marital status: Married    Spouse name: Not on file   Number of children: Not on file   Years of education: Not on file   Highest education level: Not on file  Occupational History   Not on file  Tobacco Use   Smoking status: Former    Packs/day: 0.00    Types: Cigarettes    Quit date: 10/08/2019    Years since quitting: 2.8   Smokeless tobacco: Never   Tobacco comments:    smokes 1/2 pack per year  Vaping Use   Vaping  Use: Never used  Substance and Sexual Activity   Alcohol use: No    Alcohol/week: 0.0 standard drinks of alcohol   Drug use: No   Sexual activity: Yes    Partners: Male    Birth control/protection: Other-see comments    Comment: withdrawal  Other Topics Concern   Not on file  Social History Narrative   Not on file   Social Determinants of Health   Financial Resource Strain: Not on file  Food Insecurity: Not on file  Transportation Needs: Not on file  Physical Activity: Not on file  Stress: Not on file  Social Connections: Not on file   Intimate Partner Violence: Not on file    FAMILY HISTORY:  Family History  Problem Relation Age of Onset   Cancer Mother        multiple myeloma   Hyperlipidemia Mother    Macular degeneration Mother    Cancer - Colon Father    Hypertension Father    Hyperlipidemia Father    Heart disease Father    Hypertension Brother    Heart disease Brother 17       Heart attack   Cancer - Colon Maternal Aunt    Cancer - Colon Maternal Uncle    Cancer - Colon Maternal Grandmother    Heart disease Paternal Grandfather     CURRENT MEDICATIONS:  Current Outpatient Medications  Medication Sig Dispense Refill   acetaminophen (TYLENOL) 500 MG tablet Take 500 mg by mouth every 6 (six) hours as needed.     atenolol (TENORMIN) 50 MG tablet Take 50 mg by mouth daily with lunch.      atorvastatin (LIPITOR) 80 MG tablet TAKE 1 TABLET BY MOUTH AT BEDTIME . APPOINTMENT REQUIRED FOR FUTURE REFILLS (OVERDUE FROM 03/2021) 30 tablet 0   BAYER ASPIRIN EC LOW DOSE 81 MG EC tablet Take 1 tablet by mouth daily.     cholecalciferol (VITAMIN D) 1000 units tablet Take 2,000 Units by mouth daily.      Cyanocobalamin (VITAMIN B-12) 2500 MCG SUBL Take 2,500 mcg by mouth daily.     docusate sodium (COLACE) 100 MG capsule Take 100 mg by mouth daily as needed for mild constipation.     ferrous sulfate 325 (65 FE) MG tablet Take 1 tablet (325 mg total) by mouth 2 (two) times daily with a meal. (Patient taking differently: Take 325 mg by mouth daily.) 60 tablet 1   ketoconazole (NIZORAL) 2 % shampoo Apply 1 Application topically daily as needed for irritation.     Lactobacillus (PROBIOTIC ACIDOPHILUS PO) Take by mouth as needed.     norethindrone (AYGESTIN) 5 MG tablet Take 1 tablet (5 mg total) by mouth daily. (Patient taking differently: Take 5 mg by mouth daily. Take 1/2 tablet) 30 tablet 0   omeprazole (PRILOSEC) 20 MG capsule Take 20 mg by mouth daily with lunch.      nitroGLYCERIN (NITROSTAT) 0.4 MG SL tablet Place 1  tablet (0.4 mg total) under the tongue every 5 (five) minutes as needed for chest pain. (Patient not taking: Reported on 06/20/2022) 25 tablet 0   No current facility-administered medications for this visit.    ALLERGIES:  Allergies  Allergen Reactions   Amoxicillin Nausea And Vomiting and Other (See Comments)    (high doses) Did it involve swelling of the face/tongue/throat, SOB, or low BP? No Did it involve sudden or severe rash/hives, skin peeling, or any reaction on the inside of your mouth or nose? No Did you  need to seek medical attention at a hospital or doctor's office? No When did it last happen? 2000      If all above answers are "NO", may proceed with cephalosporin use.     PHYSICAL EXAM:  Performance status (ECOG): 1 - Symptomatic but completely ambulatory  Vitals:   08/17/22 1513  BP: 136/86  Pulse: 67  Resp: 18  Temp: (!) 97.3 F (36.3 C)  SpO2: 99%   Wt Readings from Last 3 Encounters:  08/17/22 216 lb 6.4 oz (98.2 kg)  07/20/22 225 lb (102.1 kg)  07/04/22 214 lb 12.8 oz (97.4 kg)   Physical Exam Vitals reviewed.  Constitutional:      Appearance: Normal appearance. She is obese.  Cardiovascular:     Rate and Rhythm: Normal rate and regular rhythm.     Pulses: Normal pulses.     Heart sounds: Normal heart sounds.  Pulmonary:     Effort: Pulmonary effort is normal.     Breath sounds: Normal breath sounds.  Neurological:     General: No focal deficit present.     Mental Status: She is alert and oriented to person, place, and time.  Psychiatric:        Mood and Affect: Mood normal.        Behavior: Behavior normal.    LABORATORY DATA:  I have reviewed the labs as listed.     Latest Ref Rng & Units 08/15/2022    9:25 AM 07/01/2022    8:34 AM 06/22/2022    2:36 PM  CBC  WBC 4.0 - 10.5 K/uL 8.6  11.2  10.9   Hemoglobin 12.0 - 15.0 g/dL 11.9  11.3  9.9   Hematocrit 36.0 - 46.0 % 39.8  38.0  33.4   Platelets 150 - 400 K/uL 723  693  653        Latest Ref Rng & Units 06/22/2022    2:36 PM 10/10/2019    5:03 AM 10/09/2019    4:21 AM  CMP  Glucose 70 - 99 mg/dL 96  82  82   BUN 6 - 20 mg/dL 8  12  7    Creatinine 0.44 - 1.00 mg/dL 0.70  0.74  0.56   Sodium 135 - 145 mmol/L 142  137  136   Potassium 3.5 - 5.1 mmol/L 3.7  3.0  4.1   Chloride 98 - 111 mmol/L 114  106  108   CO2 22 - 32 mmol/L 23  21  19    Calcium 8.9 - 10.3 mg/dL 8.9  8.6  8.6   Total Protein 6.5 - 8.1 g/dL   6.1   Total Bilirubin 0.3 - 1.2 mg/dL   1.6   Alkaline Phos 38 - 126 U/L   75   AST 15 - 41 U/L   38   ALT 0 - 44 U/L   23       Component Value Date/Time   RBC 4.91 08/15/2022 0925   MCV 81.1 08/15/2022 0925   MCH 24.2 (L) 08/15/2022 0925   MCHC 29.9 (L) 08/15/2022 0925   RDW 19.5 (H) 08/15/2022 0925   LYMPHSABS 2.4 08/15/2022 0925   MONOABS 0.7 08/15/2022 0925   EOSABS 0.3 08/15/2022 0925   BASOSABS 0.1 08/15/2022 0925    DIAGNOSTIC IMAGING:  I have independently reviewed the scans and discussed with the patient. No results found.   ASSESSMENT:  JAK2 positive polycythemia vera: - Patient evaluated for thrombocytosis ranging between 421-741 since February 2013 -  No aquagenic pruritus/vasomotor symptoms/thrombosis/B symptoms - She reports irregular menses for the last 3 to 4 years, menstrual bleeding continuously for the last 40 days.  She is scheduled for hysteroscopy and D&C on 07/05/2022 - She also developed microcytosis for the last year with low normal hemoglobin, indicating iron deficiency state. - She is on baby aspirin since MI on 10/08/2019 - JAK2 V617F test positive on 06/13/2022       -Risk stratification: Low risk is less than 33 years old and no history of DVT.    Social/family history: - She worked Marketing executive jobs.  No exposure to chemicals.  Smoked half pack per day and quit about 10 to 15 years ago. - Mother had multiple myeloma.  Maternal aunt and uncle had colon cancer.  Maternal grandmother had uterine cancer.  Maternal  grandfather had prostate cancer.   PLAN:  JAK2 V617F positive polycythemia vera: - We discussed for patients with low risk PV, cytoreductive therapy is not usually indicated.  It is reserved for uncontrolled PV associated symptoms, progressive increase of leukocyte and/or platelet counts, symptomatic or progressive splenomegaly and poor tolerance of phlebotomy. - Labs from 08/15/2022 shows hematocrit is 39, platelet count 723 and white count is normal at 8.6.  LDH is 170.. - She reported itching on the upper back and legs, sometimes after taking shower and other times at night.  It does not happen every day.  Itching only lasting less than 5 minutes.  We will closely monitor it.  I would recommend follow-up in 3 months with repeat labs. - I will also consider NGS testing and baseline bone marrow biopsy with cytogenetics if we are starting Hydrea.  2.  Vaginal bleeding: - She had IUD placed recently.  She is no longer having any menstrual bleeding.       - Continue iron tablet daily.  Will check ferritin and iron panel at next visit.  Orders placed this encounter:  No orders of the defined types were placed in this encounter.    Derek Jack, MD [place] [phone]

## 2022-08-17 NOTE — Patient Instructions (Addendum)
[place] at [facility] Discharge Instructions  You were seen and examined today by Dr. Delton Coombes.  Dr. Delton Coombes discussed your most recent lab work which revealed your iron, hemoglobin and hematocrit have improved but still low and your platelet count is high. Platelets can go high if your iron is low. If your platelet count goes above 1 million Dr. Delton Coombes will then discuss starting medication called Hydrea.   Dr. Delton Coombes is going to recheck labs at your next visit.  Follow-up as scheduled in 3 months.    Thank you for choosing [place] at [facility] to provide your oncology and hematology care.  To afford each patient quality time with our provider, please arrive at least 15 minutes before your scheduled appointment time.   If you have a lab appointment with the [facility] please come in thru the Main Entrance and check in at the main information desk.  You need to re-schedule your appointment should you arrive 10 or more minutes late.  We strive to give you quality time with our providers, and arriving late affects you and other patients whose appointments are after yours.  Also, if you no show three or more times for appointments you may be dismissed from the clinic at the providers discretion.     Again, thank you for choosing [facility].  Our hope is that these requests will decrease the amount of time that you wait before being seen by our physicians.       _____________________________________________________________  Should you have questions after your visit to [facility], please contact our office at [phone] and follow the prompts.  Our office hours are 8:00 a.m. and 4:30 p.m. Monday - Friday.  Please note that voicemails left after 4:00 p.m. may not be returned until the following business day.  We are closed weekends and major holidays.  You do have access to a nurse 24-7, just call  the main number to the clinic [phone] and do not press any options, hold on the line and a nurse will answer the phone.    For prescription refill requests, have your pharmacy contact our office and allow 72 hours.

## 2022-08-17 NOTE — Progress Notes (Unsigned)
GYNECOLOGY  VISIT   HPI: 56 y.o.   Married  Caucasian  female   [id number] with No LMP recorded. (Menstrual status: IUD).   here for 4 week IUD check.  No menses with IUD only occ. Spotting.  Has acne and a headache, so she wonders if she is having her menstrual period.  Off Aygestin overlap with the Mirena for a couple of weeks.   No pain.   Sexually active and no pain for patient or her partner.   Saw her oncologist recently.  Iron has now increased.  Taking aspirin.   GYNECOLOGIC HISTORY: No LMP recorded. (Menstrual status: IUD). Contraception:  Mirena IUD 07-20-22 Menopausal hormone therapy: none Last mammogram:   05-04-21 3D/Neg/BiRads1 Last pap smear:  06-17-21 ASCUS:Neg HR HPV, 06-05-18 Neg:Neg HR HPV,  05/19/17 Pap and HR HPV neg        OB History     Gravida  2   Para  1   Term  1   Preterm      AB  1   Living  1      SAB      IAB  1   Ectopic      Multiple      Live Births  1              Patient Active Problem List   Diagnosis Date Noted   Myeloproliferative neoplasm ([place]) 07/04/2022   Thrombocytosis 06/13/2022   Hypertension 10/10/2019   Hyperlipidemia 10/10/2019   Acute ST elevation myocardial infarction (STEMI) due to occlusion of left anterior descending (LAD) coronary artery (HCC)    Atypical squamous cell changes of undetermined significance (ASCUS) on cervical cytology with positive high risk human papilloma virus (HPV) 06/06/2014    Past Medical History:  Diagnosis Date   Abnormal Pap smear of cervix    Anemia    Coronary artery disease    Essential hypertension    Family history of adverse reaction to anesthesia    mother- has proble,ms with N/v   GERD (gastroesophageal reflux disease)    History of colonic polyps    History of migraine    Occasional, menustral cycle related   Hx of chlamydia infection 1989   Hyperlipidemia    PV (polycythemia vera) ([place]) 05/26/2022   STEMI (ST elevation myocardial infarction) ([place])     10/08/19 PCI/DESx1 to pLAD    Past Surgical History:  Procedure Laterality Date   BREAST BIOPSY Right    BREAST SURGERY  01/2016   Rt.breast bx--?[facility]   COLONOSCOPY WITH PROPOFOL N/A 11/28/2014   Procedure: COLONOSCOPY WITH PROPOFOL;  Surgeon: Cleotis Nipper, MD;  [facility];  Service: Endoscopy;  Laterality: N/A;   CORONARY/GRAFT ACUTE MI REVASCULARIZATION N/A 10/08/2019   Procedure: Coronary/Graft Acute MI Revascularization;  Surgeon: Burnell Blanks, MD;  Location: [facility];  Service: Cardiovascular;  Laterality: N/A;   DILATATION & CURETTAGE/HYSTEROSCOPY WITH MYOSURE N/A 06/23/2022   Procedure: DILATATION & CURETTAGE/HYSTEROSCOPY WITH MYOSURE FIBROID RESECTION;  Surgeon: Nunzio Cobbs, MD;  Location: [facility];  Service: Gynecology;  Laterality: N/A;   Fibroid resection  01/2012   Dr. Joan Flores   HYSTEROSCOPY  01/2012   Dr. Joan Flores   LEFT HEART CATH AND CORONARY ANGIOGRAPHY N/A 10/08/2019   Procedure: LEFT HEART CATH AND CORONARY ANGIOGRAPHY;  Surgeon: Burnell Blanks, MD;  Location: [facility];  Service: Cardiovascular;  Laterality: N/A;   SKIN LESION EXCISION     --actually frozen  by dermatology--benign   TOE SURGERY     Toe nail removed    Current Outpatient Medications  Medication Sig Dispense Refill   acetaminophen (TYLENOL) 500 MG tablet Take 500 mg by mouth every 6 (six) hours as needed.     atenolol (TENORMIN) 50 MG tablet Take 50 mg by mouth daily with lunch.      atorvastatin (LIPITOR) 80 MG tablet TAKE 1 TABLET BY MOUTH AT BEDTIME . APPOINTMENT REQUIRED FOR FUTURE REFILLS (OVERDUE FROM 03/2021) 30 tablet 0   BAYER ASPIRIN EC LOW DOSE 81 MG EC tablet Take 1 tablet by mouth daily.     cholecalciferol (VITAMIN D) 1000 units tablet Take 2,000 Units by mouth daily.      Cyanocobalamin (VITAMIN B-12) 2500 MCG SUBL Take 2,500 mcg by mouth daily.     docusate sodium (COLACE) 100 MG capsule Take 100 mg by mouth daily as needed for  mild constipation.     ferrous sulfate 325 (65 FE) MG tablet Take 1 tablet (325 mg total) by mouth 2 (two) times daily with a meal. (Patient taking differently: Take 325 mg by mouth daily.) 60 tablet 1   ketoconazole (NIZORAL) 2 % shampoo Apply 1 Application topically daily as needed for irritation.     Lactobacillus (PROBIOTIC ACIDOPHILUS PO) Take by mouth as needed.     lactobacillus acidophilus (BACID) TABS tablet Take 2 tablets by mouth 3 (three) times daily.     omeprazole (PRILOSEC) 20 MG capsule Take 20 mg by mouth daily with lunch.      nitroGLYCERIN (NITROSTAT) 0.4 MG SL tablet Place 1 tablet (0.4 mg total) under the tongue every 5 (five) minutes as needed for chest pain. (Patient not taking: Reported on 06/20/2022) 25 tablet 0   No current facility-administered medications for this visit.     ALLERGIES: Amoxicillin  Family History  Problem Relation Age of Onset   Cancer Mother        multiple myeloma   Hyperlipidemia Mother    Macular degeneration Mother    Cancer - Colon Father    Hypertension Father    Hyperlipidemia Father    Heart disease Father    Hypertension Brother    Heart disease Brother 41       Heart attack   Cancer - Colon Maternal Aunt    Cancer - Colon Maternal Uncle    Cancer - Colon Maternal Grandmother    Heart disease Paternal Grandfather     Social History   Socioeconomic History   Marital status: Married    Spouse name: Not on file   Number of children: Not on file   Years of education: Not on file   Highest education level: Not on file  Occupational History   Not on file  Tobacco Use   Smoking status: Former    Packs/day: 0.00    Types: Cigarettes    Quit date: 10/08/2019    Years since quitting: 2.8   Smokeless tobacco: Never   Tobacco comments:    smokes 1/2 pack per year  Vaping Use   Vaping Use: Never used  Substance and Sexual Activity   Alcohol use: No    Alcohol/week: 0.0 standard drinks of alcohol   Drug use: No   Sexual  activity: Yes    Partners: Male    Birth control/protection: Other-see comments    Comment: withdrawal  Other Topics Concern   Not on file  Social History Narrative   Not on file   Social  Determinants of Health   Financial Resource Strain: Not on file  Food Insecurity: Not on file  Transportation Needs: Not on file  Physical Activity: Not on file  Stress: Not on file  Social Connections: Not on file  Intimate Partner Violence: Not on file    Review of Systems  All other systems reviewed and are negative.   PHYSICAL EXAMINATION:    BP 122/80   Pulse 72   Ht 5' 4.5" (1.638 m)   Wt 225 lb (102.1 kg)   SpO2 97%   BMI 38.02 kg/m     General appearance: alert, cooperative and appears stated age  Pelvic: External genitalia:  no lesions              Urethra:  normal appearing urethra with no masses, tenderness or lesions              Bartholins and Skenes: normal                 Vagina: normal appearing vagina with normal color and discharge, no lesions              Cervix: no lesions.  IUD strings noted.  Minimal blood in vagina.                 Bimanual Exam:  Uterus:  normal size, contour, position, consistency, mobility, non-tender              Adnexa: no mass, fullness, tenderness             Chaperone was present for exam:  Estill Bamberg, CMA  ASSESSMENT  Mirena IUD check up.  Doing well.  Abnormal perimenopausal bleeding controlled with Mirena.  Myeloproliferative disorder.  On ASA 81 mg daily.  PLAN  We discussed bleeding profiles with Mirena IUD.  I did let her know we can check an estradiol level and [facility] if we have questions about her hormonal status.  She will update her mammogram.  Brochure on menopause.  Fu for annual exam and prn.   An After Visit Summary was printed and given to the patient.  24 min  total time was spent for this patient encounter, including preparation, face-to-face counseling with the patient, coordination of care, and documentation of  the encounter.

## 2022-08-18 ENCOUNTER — Encounter: Payer: Self-pay | Admitting: Obstetrics and Gynecology

## 2022-08-18 ENCOUNTER — Ambulatory Visit: Payer: Commercial Managed Care - PPO | Admitting: Obstetrics and Gynecology

## 2022-08-18 VITALS — BP 122/80 | HR 72 | Ht 64.5 in | Wt 225.0 lb

## 2022-08-18 DIAGNOSIS — Z30431 Encounter for routine checking of intrauterine contraceptive device: Secondary | ICD-10-CM | POA: Diagnosis not present

## 2022-08-18 IMAGING — MG MM DIGITAL SCREENING BILAT W/ TOMO AND CAD
8 series · 8 of 24 positions shown · non-contrast
Comparison: Previous exam(s).

CLINICAL DATA: Screening.

EXAM:
DIGITAL SCREENING BILATERAL MAMMOGRAM WITH TOMOSYNTHESIS AND CAD
TECHNIQUE: Bilateral screening digital craniocaudal and mediolateral oblique
mammograms were obtained. Bilateral screening digital breast
tomosynthesis was performed. The images were evaluated with
computer-aided detection.

[L CC synth-2D]
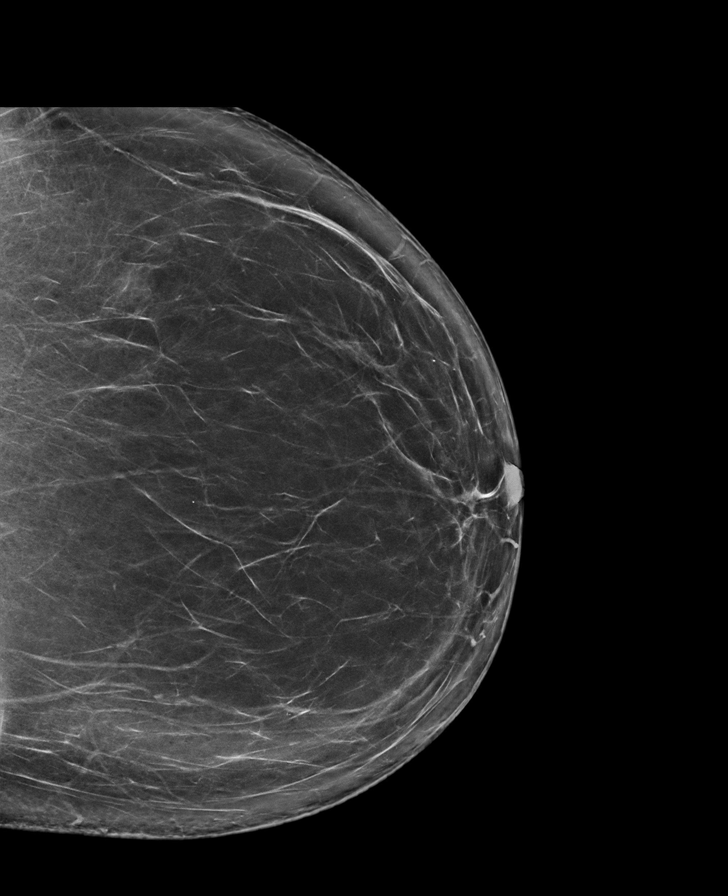

[L MLO synth-2D]
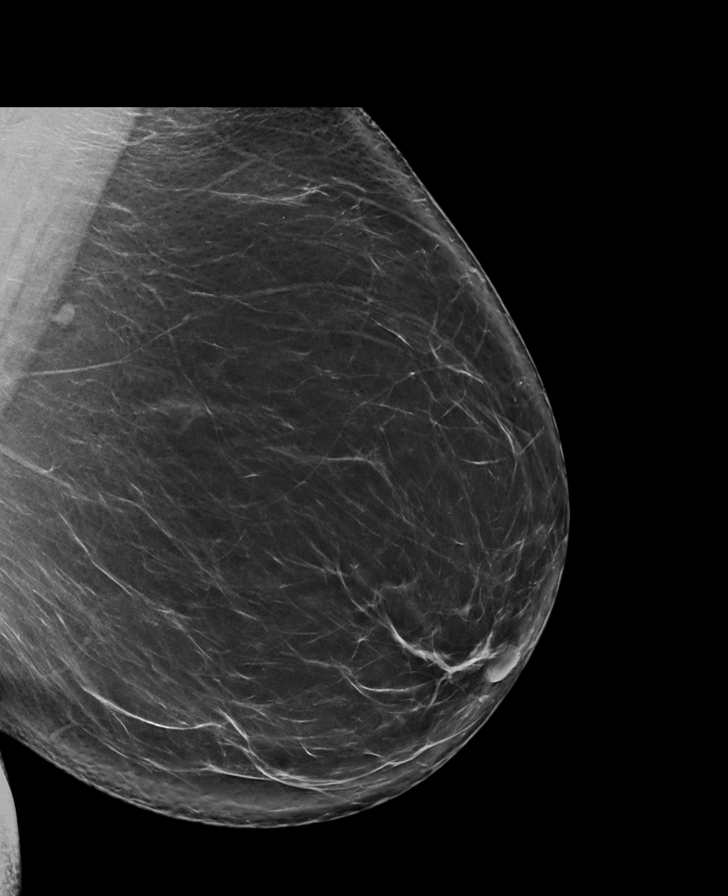

[R CC synth-2D]
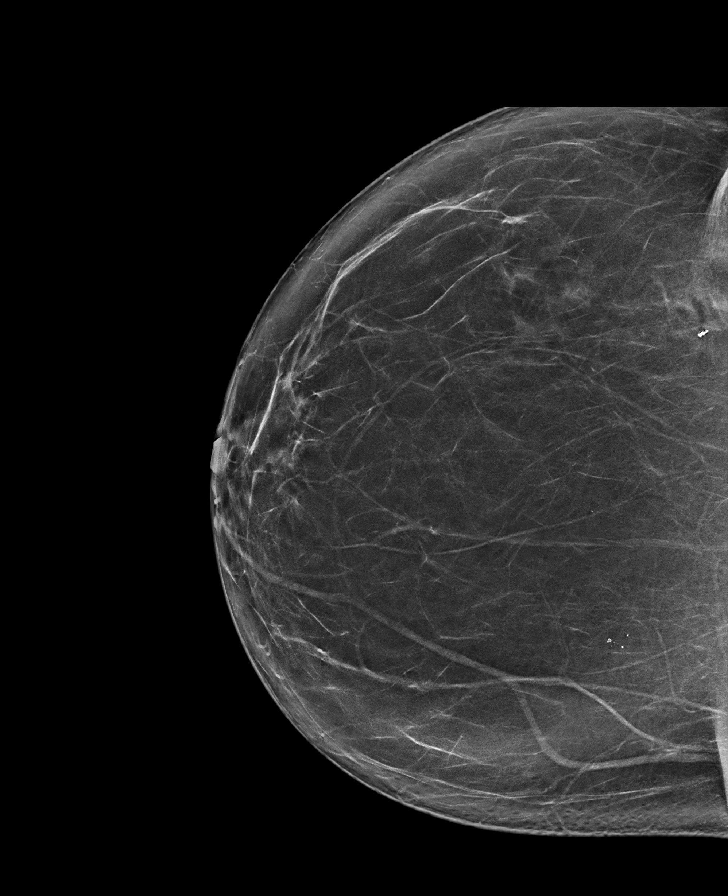

[R MLO synth-2D]
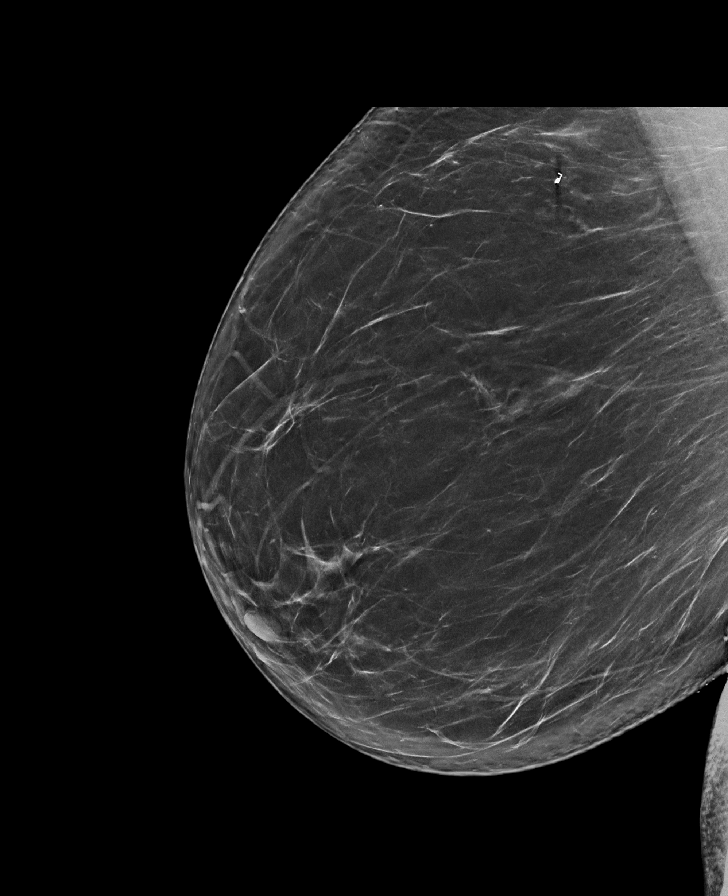

[R CC tomo · tomo slice 44/87.0]
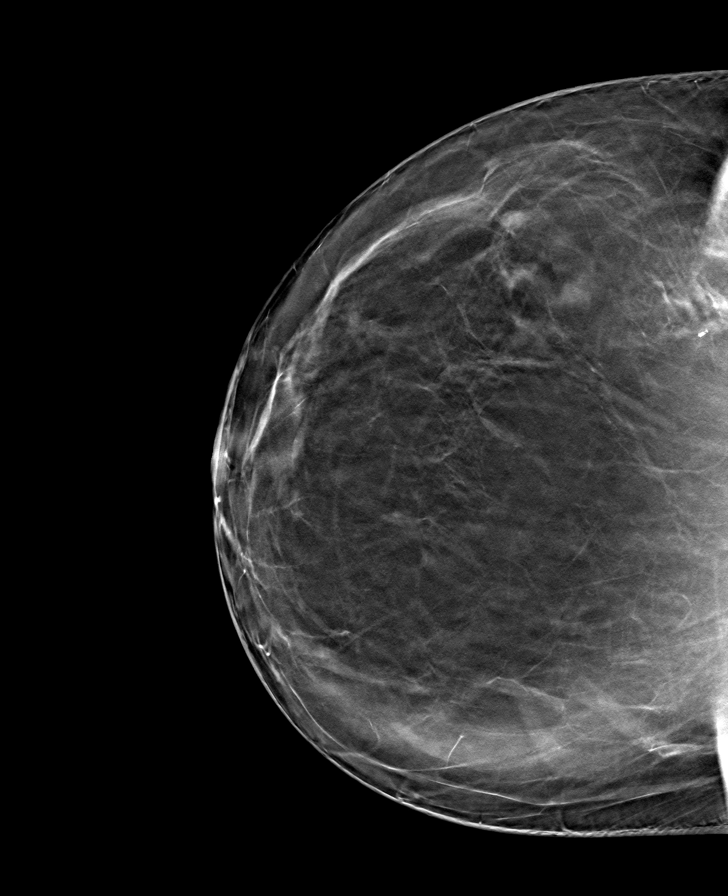

[L MLO tomo · tomo slice 47/94.0]
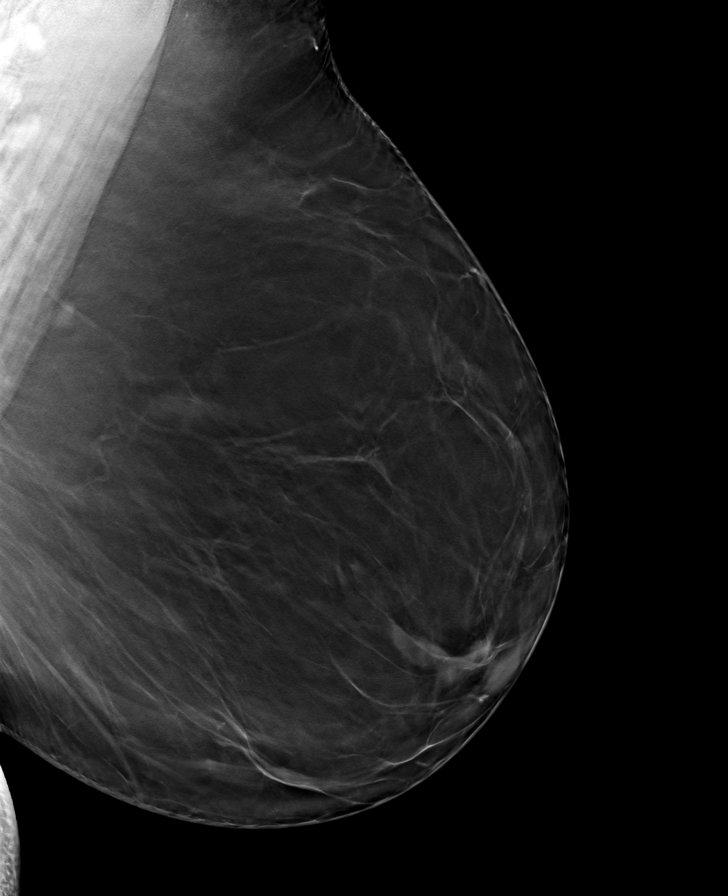

[R MLO tomo · tomo slice 47/93.0]
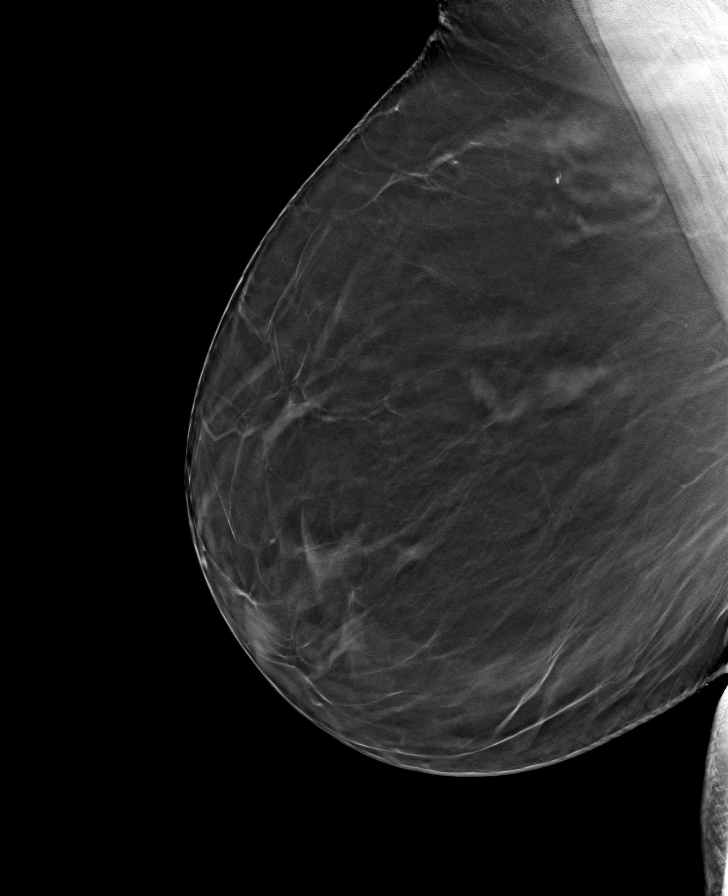

[L CC tomo · tomo slice 45/90.0]
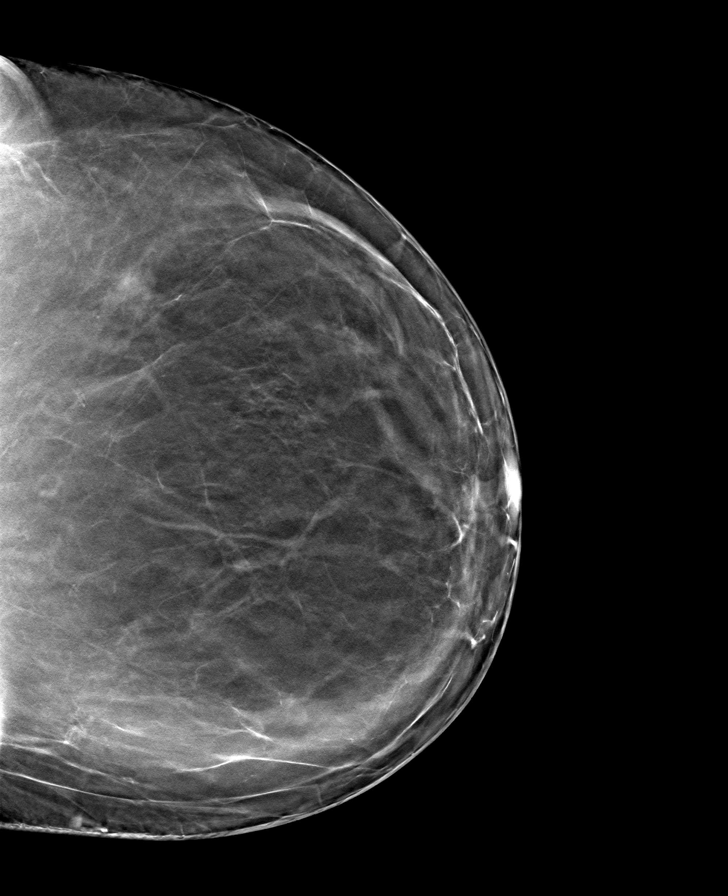

[8 of 24 positions shown; findings below may reference images not displayed]

ACR Breast Density Category b: There are scattered areas of
fibroglandular density.
FINDINGS: There are no findings suspicious for malignancy. The images were
evaluated with computer-aided detection.
IMPRESSION: No mammographic evidence of malignancy. A result letter of this
screening mammogram will be mailed directly to the patient.

RECOMMENDATION:
Screening mammogram in one year. (Code:WJ-I-BG6)

BI-RADS CATEGORY  1: Negative.

## 2022-09-16 ENCOUNTER — Other Ambulatory Visit: Payer: Self-pay | Admitting: Obstetrics and Gynecology

## 2022-09-16 DIAGNOSIS — Z1231 Encounter for screening mammogram for malignant neoplasm of breast: Secondary | ICD-10-CM

## 2022-09-27 ENCOUNTER — Other Ambulatory Visit: Payer: Self-pay | Admitting: Cardiology

## 2022-10-12 ENCOUNTER — Ambulatory Visit
Admission: RE | Admit: 2022-10-12 | Discharge: 2022-10-12 | Disposition: A | Discharge status: Home / Self Care | Payer: Commercial Managed Care - PPO | Source: Ambulatory Visit | Attending: Obstetrics and Gynecology | Admitting: Obstetrics and Gynecology

## 2022-10-12 DIAGNOSIS — Z1231 Encounter for screening mammogram for malignant neoplasm of breast: Secondary | ICD-10-CM

## 2022-11-10 ENCOUNTER — Encounter: Payer: Self-pay | Admitting: Obstetrics and Gynecology

## 2022-11-10 ENCOUNTER — Ambulatory Visit (INDEPENDENT_AMBULATORY_CARE_PROVIDER_SITE_OTHER): Payer: Commercial Managed Care - PPO | Admitting: Obstetrics and Gynecology

## 2022-11-10 VITALS — BP 130/80 | HR 84 | Ht 64.0 in | Wt 222.0 lb

## 2022-11-10 DIAGNOSIS — N898 Other specified noninflammatory disorders of vagina: Secondary | ICD-10-CM

## 2022-11-10 DIAGNOSIS — Z01419 Encounter for gynecological examination (general) (routine) without abnormal findings: Secondary | ICD-10-CM

## 2022-11-10 NOTE — Patient Instructions (Signed)

## 2022-11-10 NOTE — Progress Notes (Signed)
56 y.o. G2P1011 Married Caucasian female here for annual exam.    No periods with her Mirena IUD.   Saw PCP yesterday.   Searching for work.   PCP:   Aaron Morrow, [facility]  No LMP recorded. (Menstrual status: IUD).           Sexually active: Yes.    The current method of family planning is IUD.    Mirena 07/20/22. Exercising: No.   Smoker:  no  Health Maintenance: Pap:   06-17-21 ASCUS:Neg HR HPV, 06-05-18 Neg:Neg HR HPV,  05/19/17 Pap and HR HPV neg  History of abnormal Pap:  yes, hx LGSIL in past. MMG:  10/13/22 BI-RADS CATEGORY 1: Negative.  Colonoscopy:  2014 - [facility].  States she is overdue.  BMD:   n/a TDaP:  pcp Gardasil:   no HIV:  neg Hep C: neg Screening Labs:  PCP Flu vaccine:  yesterday.  Covid vaccine and Shingrix discussed.    reports that she quit smoking about 3 years ago. Her smoking use included cigarettes. She has never used smokeless tobacco. She reports that she does not drink alcohol and does not use drugs.  Past Medical History:  Diagnosis Date   Abnormal Pap smear of cervix    Anemia    Coronary artery disease    Essential hypertension    Family history of adverse reaction to anesthesia    mother- has proble,ms with N/v   GERD (gastroesophageal reflux disease)    History of colonic polyps    History of migraine    Occasional, menustral cycle related   Hx of chlamydia infection 1989   Hyperlipidemia    PV (polycythemia vera) ([facility]) 05/26/2022   STEMI (ST elevation myocardial infarction) ([facility])    10/08/19 PCI/DESx1 to pLAD    Past Surgical History:  Procedure Laterality Date   BREAST BIOPSY Right    BREAST SURGERY  01/2016   Rt.breast bx--?[facility]   COLONOSCOPY WITH PROPOFOL N/A 11/28/2014   Procedure: COLONOSCOPY WITH PROPOFOL;  Surgeon: Robert Buccini V, MD;  [facility];  Service: Endoscopy;  Laterality: N/A;   CORONARY/GRAFT ACUTE MI REVASCULARIZATION N/A 10/08/2019   Procedure: Coronary/Graft Acute MI Revascularization;  Surgeon:  McAlhany, Christopher D, MD;  [facility];  Service: Cardiovascular;  Laterality: N/A;   DILATATION & CURETTAGE/HYSTEROSCOPY WITH MYOSURE N/A 06/23/2022   Procedure: DILATATION & CURETTAGE/HYSTEROSCOPY WITH MYOSURE FIBROID RESECTION;  Surgeon: Amundson C Silva, Brook E, MD;  [facility];  Service: Gynecology;  Laterality: N/A;   Fibroid resection  01/2012   Dr. Romine   HYSTEROSCOPY  01/2012   Dr. Romine   LEFT HEART CATH AND CORONARY ANGIOGRAPHY N/A 10/08/2019   Procedure: LEFT HEART CATH AND CORONARY ANGIOGRAPHY;  Surgeon: McAlhany, Christopher D, MD;  [facility];  Service: Cardiovascular;  Laterality: N/A;   SKIN LESION EXCISION     --actually frozen by dermatology--benign   TOE SURGERY     Toe nail removed    Current Outpatient Medications  Medication Sig Dispense Refill   acetaminophen (TYLENOL) 500 MG tablet Take 500 mg by mouth every 6 (six) hours as needed.     atenolol (TENORMIN) 50 MG tablet Take 50 mg by mouth daily with lunch.      atorvastatin (LIPITOR) 80 MG tablet TAKE 1 TABLET BY MOUTH AT BEDTIME . APPOINTMENT REQUIRED FOR FUTURE REFILLS 90 tablet 2   BAYER ASPIRIN EC LOW DOSE 81 MG EC tablet Take 1 tablet by mouth daily.       cholecalciferol (VITAMIN D) 1000 units tablet Take 2,000 Units by mouth daily.      Cyanocobalamin (VITAMIN B-12) 2500 MCG SUBL Take 2,500 mcg by mouth daily.     docusate sodium (COLACE) 100 MG capsule Take 100 mg by mouth daily as needed for mild constipation.     ferrous sulfate 325 (65 FE) MG tablet Take 1 tablet (325 mg total) by mouth 2 (two) times daily with a meal. (Patient taking differently: Take 325 mg by mouth daily.) 60 tablet 1   ketoconazole (NIZORAL) 2 % shampoo Apply 1 Application topically daily as needed for irritation.     Lactobacillus (PROBIOTIC ACIDOPHILUS PO) Take by mouth as needed.     lactobacillus acidophilus (BACID) TABS tablet Take 2 tablets by mouth 3 (three) times daily.      levonorgestrel (MIRENA) 20 MCG/DAY IUD 1 each by Intrauterine route once.     omeprazole (PRILOSEC) 20 MG capsule Take 20 mg by mouth daily with lunch.      nitroGLYCERIN (NITROSTAT) 0.4 MG SL tablet Place 1 tablet (0.4 mg total) under the tongue every 5 (five) minutes as needed for chest pain. (Patient not taking: Reported on 06/20/2022) 25 tablet 0   No current facility-administered medications for this visit.    Family History  Problem Relation Age of Onset   Cancer Mother        multiple myeloma   Hyperlipidemia Mother    Macular degeneration Mother    Cancer - Colon Father    Hypertension Father    Hyperlipidemia Father    Heart disease Father    Hypertension Brother    Heart disease Brother 47       Heart attack   Cancer - Colon Maternal Aunt    Cancer - Colon Maternal Uncle    Cancer - Colon Maternal Grandmother    Heart disease Paternal Grandfather     Review of Systems  All other systems reviewed and are negative.   Exam:   BP 130/80 (BP Location: Right Arm, Patient Position: Sitting, Cuff Size: Large)   Pulse 84   Ht 5' 4" (1.626 m)   Wt 222 lb (100.7 kg)   BMI 38.11 kg/m     General appearance: alert, cooperative and appears stated age Head: normocephalic, without obvious abnormality, atraumatic Neck: no adenopathy, supple, symmetrical, trachea midline and thyroid normal to inspection and palpation Lungs: clear to auscultation bilaterally Breasts: normal appearance, no masses or tenderness, No nipple retraction or dimpling, No nipple discharge or bleeding, No axillary adenopathy Heart: regular rate and rhythm Abdomen: soft, non-tender; no masses, no organomegaly Extremities: extremities normal, atraumatic, no cyanosis or edema Skin: skin color, texture, turgor normal. No rashes or lesions Lymph nodes: cervical, supraclavicular, and axillary nodes normal. Neurologic: grossly normal  Pelvic: External genitalia:  no lesions              No abnormal inguinal  nodes palpated.              Urethra:  normal appearing urethra with no masses, tenderness or lesions              Bartholins and Skenes: normal                 Vagina: normal appearing vagina with normal color and discharge, left vaginal apical polypoid area, 5 mm.              Cervix: no lesions.  IUD strings noted.                 Pap taken: no.  Bimanual Exam:  Uterus:  normal size, contour, position, consistency, mobility, non-tender              Adnexa: no mass, fullness, tenderness              Rectal exam: yes.  Confirms.              Anus:  normal sphincter tone, no lesions  Chaperone was present for exam:  Kimalexis  Assessment:   Well woman visit with gynecologic exam. Mirena IUD. ASCUS pap and negative HR HPV. FH colon cancer.  Hx MI.  Has left vaginal apical polyp.   Thrombocytosis.   Plan: Mammogram screening discussed. Self breast awareness reviewed. Pap and HR HPV 2025. Guidelines for Calcium, Vitamin D, regular exercise program including cardiovascular and weight bearing exercise. Return for vaginal biopsy.  Procedure and rationale explained.  She will schedule her colonoscopy.  Follow up annually and prn.   After visit summary provided.      

## 2022-11-22 ENCOUNTER — Inpatient Hospital Stay: Payer: Commercial Managed Care - PPO | Attending: Hematology

## 2022-11-22 DIAGNOSIS — D45 Polycythemia vera: Secondary | ICD-10-CM | POA: Insufficient documentation

## 2022-11-22 DIAGNOSIS — D75839 Thrombocytosis, unspecified: Secondary | ICD-10-CM

## 2022-11-22 LAB — CBC WITH DIFFERENTIAL/PLATELET
Abs Immature Granulocytes: 0.03 10*3/uL (ref 0.00–0.07)
Basophils Absolute: 0.1 10*3/uL (ref 0.0–0.1)
Basophils Relative: 1 %
Eosinophils Absolute: 0.2 10*3/uL (ref 0.0–0.5)
Eosinophils Relative: 2 %
HCT: 46.6 % — ABNORMAL HIGH (ref 36.0–46.0)
Hemoglobin: 14.7 g/dL (ref 12.0–15.0)
Immature Granulocytes: 0 %
Lymphocytes Relative: 26 %
Lymphs Abs: 2.5 10*3/uL (ref 0.7–4.0)
MCH: 27.2 pg (ref 26.0–34.0)
MCHC: 31.5 g/dL (ref 30.0–36.0)
MCV: 86.1 fL (ref 80.0–100.0)
Monocytes Absolute: 0.6 10*3/uL (ref 0.1–1.0)
Monocytes Relative: 6 %
Neutro Abs: 6.2 10*3/uL (ref 1.7–7.7)
Neutrophils Relative %: 65 %
Platelets: 692 10*3/uL — ABNORMAL HIGH (ref 150–400)
RBC: 5.41 MIL/uL — ABNORMAL HIGH (ref 3.87–5.11)
RDW: 16.9 % — ABNORMAL HIGH (ref 11.5–15.5)
WBC: 9.5 10*3/uL (ref 4.0–10.5)
nRBC: 0 % (ref 0.0–0.2)

## 2022-11-22 LAB — IRON AND TIBC
Iron: 101 ug/dL (ref 28–170)
Saturation Ratios: 26 % (ref 10.4–31.8)
TIBC: 382 ug/dL (ref 250–450)
UIBC: 281 ug/dL

## 2022-11-22 LAB — LACTATE DEHYDROGENASE: LDH: 189 U/L (ref 98–192)

## 2022-11-22 LAB — RETICULOCYTES
Immature Retic Fract: 8.3 % (ref 2.3–15.9)
RBC.: 5.4 MIL/uL — ABNORMAL HIGH (ref 3.87–5.11)
Retic Count, Absolute: 74 10*3/uL (ref 19.0–186.0)
Retic Ct Pct: 1.4 % (ref 0.4–3.1)

## 2022-11-22 LAB — FERRITIN: Ferritin: 18 ng/mL (ref 11–307)

## 2022-11-23 NOTE — Progress Notes (Signed)
GYNECOLOGY  VISIT   HPI: 56 y.o.   Married  Caucasian  female   G2P1011 with Patient's last menstrual period was 07/14/2022 (approximate).   here for   vaginal bx.  Polyp noted with recent routine examination on 11/10/22.   Hx prior LGSIL 2016.  GYNECOLOGIC HISTORY: Patient's last menstrual period was 07/14/2022 (approximate). Contraception:  IUD, Mirena 07/20/22 Menopausal hormone therapy:  n/a Last mammogram:  10/13/22 BI-RADS CATEGORY 1: Negative.   Last pap smear:   06-17-21 ASCUS:Neg HR HPV, 06-05-18 Neg:Neg HR HPV,  05/19/17 Pap and HR HPV neg          OB History     Gravida  2   Para  1   Term  1   Preterm      AB  1   Living  1      SAB      IAB  1   Ectopic      Multiple      Live Births  1              Patient Active Problem List   Diagnosis Date Noted   Myeloproliferative neoplasm ([place]) 07/04/2022   Thrombocytosis 06/13/2022   Hypertension 10/10/2019   Hyperlipidemia 10/10/2019   Acute ST elevation myocardial infarction (STEMI) due to occlusion of left anterior descending (LAD) coronary artery (HCC)    Atypical squamous cell changes of undetermined significance (ASCUS) on cervical cytology with positive high risk human papilloma virus (HPV) 06/06/2014    Past Medical History:  Diagnosis Date   Abnormal Pap smear of cervix    Anemia    Coronary artery disease    Essential hypertension    Family history of adverse reaction to anesthesia    mother- has proble,ms with N/v   GERD (gastroesophageal reflux disease)    History of colonic polyps    History of migraine    Occasional, menustral cycle related   Hx of chlamydia infection 1989   Hyperlipidemia    PV (polycythemia vera) ([place]) 05/26/2022   STEMI (ST elevation myocardial infarction) ([place] [place])    10/08/19 PCI/DESx1 to pLAD    Past Surgical History:  Procedure Laterality Date   BREAST BIOPSY Right    BREAST SURGERY  01/2016   Rt.breast bx--?[facility]   COLONOSCOPY WITH PROPOFOL N/A  11/28/2014   Procedure: COLONOSCOPY WITH PROPOFOL;  Surgeon: Cleotis Nipper, MD;  [facility];  Service: Endoscopy;  Laterality: N/A;   CORONARY/GRAFT ACUTE MI REVASCULARIZATION N/A 10/08/2019   Procedure: Coronary/Graft Acute MI Revascularization;  Surgeon: Burnell Blanks, MD;  Location: [facility];  Service: Cardiovascular;  Laterality: N/A;   DILATATION & CURETTAGE/HYSTEROSCOPY WITH MYOSURE N/A 06/23/2022   Procedure: DILATATION & CURETTAGE/HYSTEROSCOPY WITH MYOSURE FIBROID RESECTION;  Surgeon: Nunzio Cobbs, MD;  [facility];  Service: Gynecology;  Laterality: N/A;   Fibroid resection  01/2012   Dr. Joan Flores   HYSTEROSCOPY  01/2012   Dr. Joan Flores   LEFT HEART CATH AND CORONARY ANGIOGRAPHY N/A 10/08/2019   Procedure: LEFT HEART CATH AND CORONARY ANGIOGRAPHY;  Surgeon: Burnell Blanks, MD;  Location: [facility];  Service: Cardiovascular;  Laterality: N/A;   SKIN LESION EXCISION     --actually frozen by dermatology--benign   TOE SURGERY     Toe nail removed    Current Outpatient Medications  Medication Sig Dispense Refill   acetaminophen (TYLENOL) 500 MG tablet Take 500 mg by mouth every 6 (six) hours as needed.  atenolol (TENORMIN) 50 MG tablet Take 50 mg by mouth daily with lunch.      atorvastatin (LIPITOR) 80 MG tablet TAKE 1 TABLET BY MOUTH AT BEDTIME . APPOINTMENT REQUIRED FOR FUTURE REFILLS 90 tablet 2   BAYER ASPIRIN EC LOW DOSE 81 MG EC tablet Take 1 tablet by mouth daily.     cholecalciferol (VITAMIN D) 1000 units tablet Take 2,000 Units by mouth daily.      Cyanocobalamin (VITAMIN B-12) 2500 MCG SUBL Take 2,500 mcg by mouth daily.     docusate sodium (COLACE) 100 MG capsule Take 100 mg by mouth daily as needed for mild constipation.     ferrous sulfate 325 (65 FE) MG tablet Take 1 tablet (325 mg total) by mouth 2 (two) times daily with a meal. (Patient taking differently: Take 325 mg by mouth daily.) 60 tablet 1    ketoconazole (NIZORAL) 2 % shampoo Apply 1 Application topically daily as needed for irritation.     Lactobacillus (PROBIOTIC ACIDOPHILUS PO) Take by mouth as needed.     lactobacillus acidophilus (BACID) TABS tablet Take 2 tablets by mouth 3 (three) times daily.     levonorgestrel (MIRENA) 20 MCG/DAY IUD 1 each by Intrauterine route once.     omeprazole (PRILOSEC) 20 MG capsule Take 20 mg by mouth daily with lunch.      nitroGLYCERIN (NITROSTAT) 0.4 MG SL tablet Place 1 tablet (0.4 mg total) under the tongue every 5 (five) minutes as needed for chest pain. (Patient not taking: Reported on 06/20/2022) 25 tablet 0   No current facility-administered medications for this visit.     ALLERGIES: Amoxicillin  Family History  Problem Relation Age of Onset   Cancer Mother        multiple myeloma   Hyperlipidemia Mother    Macular degeneration Mother    Cancer - Colon Father    Hypertension Father    Hyperlipidemia Father    Heart disease Father    Hypertension Brother    Heart disease Brother 3       Heart attack   Cancer - Colon Maternal Aunt    Cancer - Colon Maternal Uncle    Cancer - Colon Maternal Grandmother    Heart disease Paternal Grandfather     Social History   Socioeconomic History   Marital status: Married    Spouse name: Not on file   Number of children: Not on file   Years of education: Not on file   Highest education level: Not on file  Occupational History   Not on file  Tobacco Use   Smoking status: Former    Packs/day: 0.00    Types: Cigarettes    Quit date: 10/08/2019    Years since quitting: 3.1   Smokeless tobacco: Never   Tobacco comments:    smokes 1/2 pack per year  Vaping Use   Vaping Use: Never used  Substance and Sexual Activity   Alcohol use: No    Alcohol/week: 0.0 standard drinks of alcohol   Drug use: No   Sexual activity: Yes    Partners: Male    Birth control/protection: Other-see comments, I.U.D.    Comment: withdrawal  Other  Topics Concern   Not on file  Social History Narrative   Not on file   Social Determinants of Health   Financial Resource Strain: Not on file  Food Insecurity: Not on file  Transportation Needs: Not on file  Physical Activity: Not on file  Stress: Not on  file  Social Connections: Not on file  Intimate Partner Violence: Not on file    Review of Systems  All other systems reviewed and are negative.   PHYSICAL EXAMINATION:    BP 129/84 (Patient Position: Sitting, Cuff Size: Large)   Ht _0  (1.651 m)   Wt 224 lb (101.6 kg)   LMP 07/14/2022 (Approximate)   BMI 37.28 kg/m     General appearance: alert, cooperative and appears stated age    Pelvic: External genitalia:  no lesions              Urethra:  normal appearing urethra with no masses, tenderness or lesions              Bartholins and Skenes: normal                 Vagina: normal appearing vagina with normal color and discharge, left vaginal wall polyp 7 mm.              Cervix: no lesions.  IUD strings noted.    Procedure Vaginal biopsy. Consent done. Sterile prep with Hibiclens.  Tischler forceps used to perform biopsy.  Tissue to pathology.  Silver nitrate used to create hemostasis. No complications.  Minimal EBL.              Chaperone was present for exam:  Raquel Sarna  ASSESSMENT  Vaginal lesion.  I suspect this is a polyp.  Hx LGSIL.   PLAN  Fu biopsy results.  Precautions post biopsy given.    An After Visit Summary was printed and given to the patient.

## 2022-11-29 ENCOUNTER — Inpatient Hospital Stay: Payer: Commercial Managed Care - PPO | Attending: Hematology | Admitting: Hematology

## 2022-11-29 VITALS — BP 131/69 | HR 66 | Temp 97.8°F | Resp 18 | Ht 65.0 in | Wt 222.0 lb

## 2022-11-29 DIAGNOSIS — Z807 Family history of other malignant neoplasms of lymphoid, hematopoietic and related tissues: Secondary | ICD-10-CM | POA: Insufficient documentation

## 2022-11-29 DIAGNOSIS — D45 Polycythemia vera: Secondary | ICD-10-CM | POA: Insufficient documentation

## 2022-11-29 DIAGNOSIS — D75839 Thrombocytosis, unspecified: Secondary | ICD-10-CM

## 2022-11-29 DIAGNOSIS — Z800 Family history of malignant neoplasm of digestive organs: Secondary | ICD-10-CM | POA: Insufficient documentation

## 2022-11-29 DIAGNOSIS — I10 Essential (primary) hypertension: Secondary | ICD-10-CM | POA: Diagnosis not present

## 2022-11-29 DIAGNOSIS — D471 Chronic myeloproliferative disease: Secondary | ICD-10-CM

## 2022-11-29 DIAGNOSIS — Z87891 Personal history of nicotine dependence: Secondary | ICD-10-CM | POA: Insufficient documentation

## 2022-11-29 NOTE — Progress Notes (Signed)
[place], [place] [address]   CLINIC:  Medical Oncology/Hematology  PCP:  London Pepper, MD [address] / [place] [address]  [phone]  REASON FOR VISIT:  Follow-up for polycythemia vera  PRIOR THERAPY: none  CURRENT THERAPY: Aspirin 81 mg daily  INTERVAL HISTORY:  [name], a 56 y.o. female, seen for polycythemia vera.  Denies any bleeding vaginally since she had IUD placed.  She is taking iron tablets daily.  She denies any aquagenic pruritus or vasomotor symptoms.  No fevers, night sweats or weight loss.  No recent infections.  REVIEW OF SYSTEMS:  Review of Systems  Constitutional:  Negative for appetite change and fatigue.  Psychiatric/Behavioral:  The patient is not nervous/anxious.   All other systems reviewed and are negative.   PAST MEDICAL/SURGICAL HISTORY:  Past Medical History:  Diagnosis Date   Abnormal Pap smear of cervix    Anemia    Coronary artery disease    Essential hypertension    Family history of adverse reaction to anesthesia    mother- has proble,ms with N/v   GERD (gastroesophageal reflux disease)    History of colonic polyps    History of migraine    Occasional, menustral cycle related   Hx of chlamydia infection 1989   Hyperlipidemia    PV (polycythemia vera) ([facility]) 05/26/2022   STEMI (ST elevation myocardial infarction) ([facility])    10/08/19 PCI/DESx1 to pLAD   Past Surgical History:  Procedure Laterality Date   BREAST BIOPSY Right    BREAST SURGERY  01/2016   Rt.breast bx--?[place] [facility]   COLONOSCOPY WITH PROPOFOL N/A 11/28/2014   Procedure: COLONOSCOPY WITH PROPOFOL;  Surgeon: Cleotis Nipper, MD;  Location: [facility];  Service: Endoscopy;  Laterality: N/A;   CORONARY/GRAFT ACUTE MI REVASCULARIZATION N/A 10/08/2019   Procedure: Coronary/Graft Acute MI Revascularization;  Surgeon: Burnell Blanks, MD;  Location: [facility];  Service:  Cardiovascular;  Laterality: N/A;   DILATATION & CURETTAGE/HYSTEROSCOPY WITH MYOSURE N/A 06/23/2022   Procedure: DILATATION & CURETTAGE/HYSTEROSCOPY WITH MYOSURE FIBROID RESECTION;  Surgeon: Nunzio Cobbs, MD;  Location: [facility];  Service: Gynecology;  Laterality: N/A;   Fibroid resection  01/2012   Dr. Joan Flores   HYSTEROSCOPY  01/2012   Dr. Joan Flores   LEFT HEART CATH AND CORONARY ANGIOGRAPHY N/A 10/08/2019   Procedure: LEFT HEART CATH AND CORONARY ANGIOGRAPHY;  Surgeon: Burnell Blanks, MD;  Location: [facility];  Service: Cardiovascular;  Laterality: N/A;   SKIN LESION EXCISION     --actually frozen by dermatology--benign   TOE SURGERY     Toe nail removed    SOCIAL HISTORY:  Social History   Socioeconomic History   Marital status: Married    Spouse name: Not on file   Number of children: Not on file   Years of education: Not on file   Highest education level: Not on file  Occupational History   Not on file  Tobacco Use   Smoking status: Former    Packs/day: 0.00    Types: Cigarettes    Quit date: 10/08/2019    Years since quitting: 3.1   Smokeless tobacco: Never   Tobacco comments:    smokes 1/2 pack per year  Vaping Use   Vaping Use: Never used  Substance and Sexual Activity   Alcohol use: No    Alcohol/week: 0.0 standard drinks of alcohol   Drug use: No  Sexual activity: Yes    Partners: Male    Birth control/protection: Other-see comments    Comment: withdrawal  Other Topics Concern   Not on file  Social History Narrative   Not on file   Social Determinants of Health   Financial Resource Strain: Not on file  Food Insecurity: Not on file  Transportation Needs: Not on file  Physical Activity: Not on file  Stress: Not on file  Social Connections: Not on file  Intimate Partner Violence: Not on file    FAMILY HISTORY:  Family History  Problem Relation Age of Onset   Cancer Mother        multiple myeloma   Hyperlipidemia Mother     Macular degeneration Mother    Cancer - Colon Father    Hypertension Father    Hyperlipidemia Father    Heart disease Father    Hypertension Brother    Heart disease Brother 49       Heart attack   Cancer - Colon Maternal Aunt    Cancer - Colon Maternal Uncle    Cancer - Colon Maternal Grandmother    Heart disease Paternal Grandfather     CURRENT MEDICATIONS:  Current Outpatient Medications  Medication Sig Dispense Refill   acetaminophen (TYLENOL) 500 MG tablet Take 500 mg by mouth every 6 (six) hours as needed.     atenolol (TENORMIN) 50 MG tablet Take 50 mg by mouth daily with lunch.      atorvastatin (LIPITOR) 80 MG tablet TAKE 1 TABLET BY MOUTH AT BEDTIME . APPOINTMENT REQUIRED FOR FUTURE REFILLS 90 tablet 2   BAYER ASPIRIN EC LOW DOSE 81 MG EC tablet Take 1 tablet by mouth daily.     cholecalciferol (VITAMIN D) 1000 units tablet Take 2,000 Units by mouth daily.      Cyanocobalamin (VITAMIN B-12) 2500 MCG SUBL Take 2,500 mcg by mouth daily.     docusate sodium (COLACE) 100 MG capsule Take 100 mg by mouth daily as needed for mild constipation.     ferrous sulfate 325 (65 FE) MG tablet Take 1 tablet (325 mg total) by mouth 2 (two) times daily with a meal. (Patient taking differently: Take 325 mg by mouth daily.) 60 tablet 1   ketoconazole (NIZORAL) 2 % shampoo Apply 1 Application topically daily as needed for irritation.     Lactobacillus (PROBIOTIC ACIDOPHILUS PO) Take by mouth as needed.     lactobacillus acidophilus (BACID) TABS tablet Take 2 tablets by mouth 3 (three) times daily.     levonorgestrel (MIRENA) 20 MCG/DAY IUD 1 each by Intrauterine route once.     omeprazole (PRILOSEC) 20 MG capsule Take 20 mg by mouth daily with lunch.      nitroGLYCERIN (NITROSTAT) 0.4 MG SL tablet Place 1 tablet (0.4 mg total) under the tongue every 5 (five) minutes as needed for chest pain. (Patient not taking: Reported on 06/20/2022) 25 tablet 0   No current facility-administered medications  for this visit.    ALLERGIES:  Allergies  Allergen Reactions   Amoxicillin Nausea And Vomiting and Other (See Comments)    (high doses) Did it involve swelling of the face/tongue/throat, SOB, or low BP? No Did it involve sudden or severe rash/hives, skin peeling, or any reaction on the inside of your mouth or nose? No Did you need to seek medical attention at a hospital or doctor's office? No When did it last happen? 2000      If all above answers are "NO", may  proceed with cephalosporin use.     PHYSICAL EXAM:  Performance status (ECOG): 1 - Symptomatic but completely ambulatory  Vitals:   11/29/22 1438  BP: 131/69  Pulse: 66  Resp: 18  Temp: 97.8 F (36.6 C)  SpO2: 99%   Wt Readings from Last 3 Encounters:  11/29/22 222 lb (100.7 kg)  11/10/22 222 lb (100.7 kg)  08/18/22 225 lb (102.1 kg)   Physical Exam Vitals reviewed.  Constitutional:      Appearance: Normal appearance. She is obese.  Cardiovascular:     Rate and Rhythm: Normal rate and regular rhythm.     Pulses: Normal pulses.     Heart sounds: Normal heart sounds.  Pulmonary:     Effort: Pulmonary effort is normal.     Breath sounds: Normal breath sounds.  Neurological:     General: No focal deficit present.     Mental Status: She is alert and oriented to person, place, and time.  Psychiatric:        Mood and Affect: Mood normal.        Behavior: Behavior normal.     LABORATORY DATA:  I have reviewed the labs as listed.     Latest Ref Rng & Units 11/22/2022    1:21 PM 08/15/2022    9:25 AM 07/01/2022    8:34 AM  CBC  WBC 4.0 - 10.5 K/uL 9.5  8.6  11.2   Hemoglobin 12.0 - 15.0 g/dL 14.7  11.9  11.3   Hematocrit 36.0 - 46.0 % 46.6  39.8  38.0   Platelets 150 - 400 K/uL 692  723  693       Latest Ref Rng & Units 06/22/2022    2:36 PM 10/10/2019    5:03 AM 10/09/2019    4:21 AM  CMP  Glucose 70 - 99 mg/dL 96  82  82   BUN 6 - 20 mg/dL _0 Creatinine 0.44 - 1.00 mg/dL 0.70  0.74  0.56    Sodium 135 - 145 mmol/L 142  137  136   Potassium 3.5 - 5.1 mmol/L 3.7  3.0  4.1   Chloride 98 - 111 mmol/L 114  106  108   CO2 22 - 32 mmol/L _1 Calcium 8.9 - 10.3 mg/dL 8.9  8.6  8.6   Total Protein 6.5 - 8.1 g/dL   6.1   Total Bilirubin 0.3 - 1.2 mg/dL   1.6   Alkaline Phos 38 - 126 U/L   75   AST 15 - 41 U/L   38   ALT 0 - 44 U/L   23       Component Value Date/Time   RBC 5.41 (H) 11/22/2022 1321   RBC 5.40 (H) 11/22/2022 1321   MCV 86.1 11/22/2022 1321   MCH 27.2 11/22/2022 1321   MCHC 31.5 11/22/2022 1321   RDW 16.9 (H) 11/22/2022 1321   LYMPHSABS 2.5 11/22/2022 1321   MONOABS 0.6 11/22/2022 1321   EOSABS 0.2 11/22/2022 1321   BASOSABS 0.1 11/22/2022 1321    DIAGNOSTIC IMAGING:  I have independently reviewed the scans and discussed with the patient. No results found.   ASSESSMENT:  JAK2 positive polycythemia vera: - Patient evaluated for thrombocytosis ranging between 421-741 since February 2013 - No aquagenic pruritus/vasomotor symptoms/thrombosis/B symptoms - She reports irregular menses for the last 3 to 4 years, menstrual bleeding continuously for the last 40 days.  She is  scheduled for hysteroscopy and D&C on 07/05/2022 - She also developed microcytosis for the last year with low normal hemoglobin, indicating iron deficiency state. - She is on baby aspirin since MI on 10/08/2019 - JAK2 V617F test positive on 06/13/2022       -Risk stratification: Low risk is less than 13 years old and no history of DVT.    Social/family history: - She worked Marketing executive jobs.  No exposure to chemicals.  Smoked half pack per day and quit about 10 to 15 years ago. - Mother had multiple myeloma.  Maternal aunt and uncle had colon cancer.  Maternal grandmother had uterine cancer.  Maternal grandfather had prostate cancer.   PLAN:  JAK2 V617F positive polycythemia vera: -As she has low risk PV, we did not offer cytoreductive therapy.  It is generally reserved for uncontrolled  PV associated symptoms, progressive increase of leukocytes and/or platelet counts, symptomatic or progressive splenomegaly and poor tolerance of phlebotomy. - She does not have any splenomegaly on examination.  No aquagenic pruritus or vasomotor symptoms. - Reviewed labs from 11/22/2022.  Hematocrit is 46.6.  Platelet count is slightly better at 692.  White count is normal.  LDH is normal. - Recommend phlebotomy x 1. - Continue aspirin 81 mg daily.  RTC 4 months with repeat labs.  2.  Vaginal bleeding: - She does not report any vaginal bleeding since IUD placed. - I have recommended her to discontinue iron tablet.  Orders placed this encounter:  Orders Placed This Encounter  Procedures   CBC with Differential/Platelet   Ferritin   Iron and TIBC   Lactate dehydrogenase      Derek Jack, MD [facility] [phone]

## 2022-11-29 NOTE — Patient Instructions (Signed)
[facility]  Discharge Instructions  You were seen and examined today by Dr. Delton Coombes.  Dr. Delton Coombes discussed your most recent lab work which revealed that your iron levels and your platelets have improved.   Stop taking the iron. Have one phlebotomy done to help lower your hematocrit since it is elevated. If you have redness, swelling and pain in one of your legs go to the emergency department.  Follow-up as scheduled in 4 months with labs.    Thank you for choosing [facility] to provide your oncology and hematology care.   To afford each patient quality time with our provider, please arrive at least 15 minutes before your scheduled appointment time. You may need to reschedule your appointment if you arrive late (10 or more minutes). Arriving late affects you and other patients whose appointments are after yours.  Also, if you miss three or more appointments without notifying the office, you may be dismissed from the clinic at the provider's discretion.    Again, thank you for choosing [facility].  Our hope is that these requests will decrease the amount of time that you wait before being seen by our physicians.   If you have a lab appointment with the [facility] please come in thru the Main Entrance and check in at the main information desk.           _____________________________________________________________  Should you have questions after your visit to [facility], please contact our office at [phone] and follow the prompts.  Our office hours are 8:00 a.m. to 4:30 p.m. Monday - Thursday and 8:00 a.m. to 2:30 p.m. Friday.  Please note that voicemails left after 4:00 p.m. may not be returned until the following business day.  We are closed weekends and all major holidays.  You do have access to a nurse 24-7, just call the main number to the clinic [phone] and do not press any options,  hold on the line and a nurse will answer the phone.    For prescription refill requests, have your pharmacy contact our office and allow 72 hours.    Masks are optional in the cancer centers. If you would like for your care team to wear a mask while they are taking care of you, please let them know. You may have one support person who is at least 54 years old accompany you for your appointments.

## 2022-11-30 ENCOUNTER — Ambulatory Visit: Payer: Commercial Managed Care - PPO | Admitting: Obstetrics and Gynecology

## 2022-11-30 ENCOUNTER — Encounter: Payer: Self-pay | Admitting: Obstetrics and Gynecology

## 2022-11-30 ENCOUNTER — Other Ambulatory Visit (HOSPITAL_COMMUNITY)
Admission: RE | Admit: 2022-11-30 | Discharge: 2022-11-30 | Disposition: A | Discharge status: Home / Self Care | Payer: Commercial Managed Care - PPO | Source: Ambulatory Visit | Attending: Obstetrics and Gynecology | Admitting: Obstetrics and Gynecology

## 2022-11-30 VITALS — BP 129/84 | Ht 65.0 in | Wt 224.0 lb

## 2022-11-30 DIAGNOSIS — D45 Polycythemia vera: Secondary | ICD-10-CM | POA: Diagnosis not present

## 2022-11-30 DIAGNOSIS — N898 Other specified noninflammatory disorders of vagina: Secondary | ICD-10-CM

## 2022-11-30 DIAGNOSIS — N842 Polyp of vagina: Secondary | ICD-10-CM

## 2022-11-30 NOTE — Patient Instructions (Signed)
Juliann Pulse,   You did well with your biopsy today.  Please call me for heavy vaginal bleeding, significant pain, or fever.   Josefa Half, MD

## 2022-12-02 LAB — SURGICAL PATHOLOGY

## 2022-12-05 ENCOUNTER — Inpatient Hospital Stay: Payer: Commercial Managed Care - PPO

## 2022-12-05 DIAGNOSIS — D45 Polycythemia vera: Secondary | ICD-10-CM | POA: Diagnosis not present

## 2022-12-05 NOTE — Progress Notes (Signed)
Carmen Edwards presents today for phlebotomy per MD orders. Phlebotomy procedure started at 1122 and ended at 1134. 8.3 mls removed . Stuck pt twice to finish phlebotomy. Not successful at this time. Rescheduled patient for later this week to try again.  Patient tolerated procedure well. IV needle removed intact.  Follow up as scheduled

## 2022-12-05 NOTE — Patient Instructions (Signed)
[facility]  Discharge Instructions: Thank you for choosing [facility] to provide your oncology and hematology care.  If you have a lab appointment with the [facility], please come in thru the Main Entrance and check in at the main information desk.  Wear comfortable clothing and clothing appropriate for easy access to any Portacath or PICC line.   We strive to give you quality time with your provider. You may need to reschedule your appointment if you arrive late (15 or more minutes).  Arriving late affects you and other patients whose appointments are after yours.  Also, if you miss three or more appointments without notifying the office, you may be dismissed from the clinic at the provider's discretion.      For prescription refill requests, have your pharmacy contact our office and allow 72 hours for refills to be completed.    Today you received a phlebotomy.       To help prevent nausea and vomiting after your treatment, we encourage you to take your nausea medication as directed.  BELOW ARE SYMPTOMS THAT SHOULD BE REPORTED IMMEDIATELY: *FEVER GREATER THAN 100.4 F (38 C) OR HIGHER *CHILLS OR SWEATING *NAUSEA AND VOMITING THAT IS NOT CONTROLLED WITH YOUR NAUSEA MEDICATION *UNUSUAL SHORTNESS OF BREATH *UNUSUAL BRUISING OR BLEEDING *URINARY PROBLEMS (pain or burning when urinating, or frequent urination) *BOWEL PROBLEMS (unusual diarrhea, constipation, pain near the anus) TENDERNESS IN MOUTH AND THROAT WITH OR WITHOUT PRESENCE OF ULCERS (sore throat, sores in mouth, or a toothache) UNUSUAL RASH, SWELLING OR PAIN  UNUSUAL VAGINAL DISCHARGE OR ITCHING   Items with * indicate a potential emergency and should be followed up as soon as possible or go to the Emergency Department if any problems should occur.  Please show the CHEMOTHERAPY ALERT CARD or IMMUNOTHERAPY ALERT CARD at check-in to the Emergency Department and triage nurse.  Should you have  questions after your visit or need to cancel or reschedule your appointment, please contact [facility] [phone]  and follow the prompts.  Office hours are 8:00 a.m. to 4:30 p.m. Monday - Friday. Please note that voicemails left after 4:00 p.m. may not be returned until the following business day.  We are closed weekends and major holidays. You have access to a nurse at all times for urgent questions. Please call the main number to the clinic [phone] and follow the prompts.  For any non-urgent questions, you may also contact your provider using MyChart. We now offer e-Visits for anyone 58 and older to request care online for non-urgent symptoms. For details visit [email or web address].   Also download the MyChart app! Go to the app store, search "MyChart", open the app, select [facility], and log in with your MyChart username and password.  Masks are optional in the cancer centers. If you would like for your care team to wear a mask while they are taking care of you, please let them know. You may have one support person who is at least 54 years old accompany you for your appointments.

## 2022-12-08 ENCOUNTER — Inpatient Hospital Stay: Payer: Commercial Managed Care - PPO

## 2022-12-08 DIAGNOSIS — D45 Polycythemia vera: Secondary | ICD-10-CM | POA: Diagnosis not present

## 2022-12-08 NOTE — Patient Instructions (Signed)
[facility]  Discharge Instructions: Thank you for choosing [facility] to provide your oncology and hematology care.  If you have a lab appointment with the Cancer Center, please come in thru the Main Entrance and check in at the main information desk.  Wear comfortable clothing and clothing appropriate for easy access to any Portacath or PICC line.   We strive to give you quality time with your provider. You may need to reschedule your appointment if you arrive late (15 or more minutes).  Arriving late affects you and other patients whose appointments are after yours.  Also, if you miss three or more appointments without notifying the office, you may be dismissed from the clinic at the provider's discretion.      For prescription refill requests, have your pharmacy contact our office and allow 72 hours for refills to be completed.     To help prevent nausea and vomiting after your treatment, we encourage you to take your nausea medication as directed.  BELOW ARE SYMPTOMS THAT SHOULD BE REPORTED IMMEDIATELY: *FEVER GREATER THAN 100.4 F (38 C) OR HIGHER *CHILLS OR SWEATING *NAUSEA AND VOMITING THAT IS NOT CONTROLLED WITH YOUR NAUSEA MEDICATION *UNUSUAL SHORTNESS OF BREATH *UNUSUAL BRUISING OR BLEEDING *URINARY PROBLEMS (pain or burning when urinating, or frequent urination) *BOWEL PROBLEMS (unusual diarrhea, constipation, pain near the anus) TENDERNESS IN MOUTH AND THROAT WITH OR WITHOUT PRESENCE OF ULCERS (sore throat, sores in mouth, or a toothache) UNUSUAL RASH, SWELLING OR PAIN  UNUSUAL VAGINAL DISCHARGE OR ITCHING   Items with * indicate a potential emergency and should be followed up as soon as possible or go to the Emergency Department if any problems should occur.  Please show the CHEMOTHERAPY ALERT CARD or IMMUNOTHERAPY ALERT CARD at check-in to the Emergency Department and triage nurse.  Should you have questions after your visit or need to  cancel or reschedule your appointment, please contact [facility] [phone]  and follow the prompts.  Office hours are 8:00 a.m. to 4:30 p.m. Monday - Friday. Please note that voicemails left after 4:00 p.m. may not be returned until the following business day.  We are closed weekends and major holidays. You have access to a nurse at all times for urgent questions. Please call the main number to the clinic [phone] and follow the prompts.  For any non-urgent questions, you may also contact your provider using MyChart. We now offer e-Visits for anyone 18 and older to request care online for non-urgent symptoms. For details visit [email or web address].   Also download the MyChart app! Go to the app store, search "MyChart", open the app, select [facility], and log in with your MyChart username and password.  Masks are optional in the cancer centers. If you would like for your care team to wear a mask while they are taking care of you, please let them know. You may have one support person who is at least 54 years old accompany you for your appointments.  

## 2022-12-08 NOTE — Progress Notes (Signed)
Patient presents today for therapeutic phlebotomy.  Patient is in satisfactory condition with no complaints voiced.  Vital signs are stable.  We will proceed with phlebotomy per provider orders.  Therapeutic phlebotomy started at 1407 and ended at 1413.  500 mLs of blood was drawn from right [facility].  Patient tolerated procedure well.  Immediately following phlebotomy patient started to clammy and dizziness.  Patient was diaphoretic.  BP was down to 66/37.  We laid patient back and gave her soda to drink.  BP had improved after a few minutes.   1434:  BP is 92/53.  Patient moved from laying to sitting position.  Position change tolerated well.   1450:  BP is 111/65.  Patient feels back to normal.  Patient moved from sitting to standing.  Position change tolerated well.  Patient left ambulatory in stable condition with vital signs stable.

## 2023-03-31 ENCOUNTER — Inpatient Hospital Stay: Payer: BC Managed Care – PPO | Attending: Hematology

## 2023-03-31 DIAGNOSIS — Z807 Family history of other malignant neoplasms of lymphoid, hematopoietic and related tissues: Secondary | ICD-10-CM | POA: Insufficient documentation

## 2023-03-31 DIAGNOSIS — Z8042 Family history of malignant neoplasm of prostate: Secondary | ICD-10-CM | POA: Insufficient documentation

## 2023-03-31 DIAGNOSIS — Z87891 Personal history of nicotine dependence: Secondary | ICD-10-CM | POA: Diagnosis not present

## 2023-03-31 DIAGNOSIS — Z800 Family history of malignant neoplasm of digestive organs: Secondary | ICD-10-CM | POA: Insufficient documentation

## 2023-03-31 DIAGNOSIS — Z7982 Long term (current) use of aspirin: Secondary | ICD-10-CM | POA: Diagnosis not present

## 2023-03-31 DIAGNOSIS — Z8049 Family history of malignant neoplasm of other genital organs: Secondary | ICD-10-CM | POA: Insufficient documentation

## 2023-03-31 DIAGNOSIS — D45 Polycythemia vera: Secondary | ICD-10-CM | POA: Diagnosis not present

## 2023-03-31 DIAGNOSIS — D75839 Thrombocytosis, unspecified: Secondary | ICD-10-CM

## 2023-03-31 DIAGNOSIS — D471 Chronic myeloproliferative disease: Secondary | ICD-10-CM

## 2023-03-31 LAB — IRON AND TIBC
Iron: 37 ug/dL (ref 28–170)
Saturation Ratios: 8 % — ABNORMAL LOW (ref 10.4–31.8)
TIBC: 448 ug/dL (ref 250–450)
UIBC: 411 ug/dL

## 2023-03-31 LAB — CBC WITH DIFFERENTIAL/PLATELET
Abs Immature Granulocytes: 0.04 10*3/uL (ref 0.00–0.07)
Basophils Absolute: 0.1 10*3/uL (ref 0.0–0.1)
Basophils Relative: 1 %
Eosinophils Absolute: 0.2 10*3/uL (ref 0.0–0.5)
Eosinophils Relative: 2 %
HCT: 44.9 % (ref 36.0–46.0)
Hemoglobin: 13.8 g/dL (ref 12.0–15.0)
Immature Granulocytes: 0 %
Lymphocytes Relative: 29 %
Lymphs Abs: 2.8 10*3/uL (ref 0.7–4.0)
MCH: 24.9 pg — ABNORMAL LOW (ref 26.0–34.0)
MCHC: 30.7 g/dL (ref 30.0–36.0)
MCV: 81 fL (ref 80.0–100.0)
Monocytes Absolute: 0.8 10*3/uL (ref 0.1–1.0)
Monocytes Relative: 8 %
Neutro Abs: 5.7 10*3/uL (ref 1.7–7.7)
Neutrophils Relative %: 60 %
Platelets: 747 10*3/uL — ABNORMAL HIGH (ref 150–400)
RBC: 5.54 MIL/uL — ABNORMAL HIGH (ref 3.87–5.11)
RDW: 14.9 % (ref 11.5–15.5)
WBC: 9.5 10*3/uL (ref 4.0–10.5)
nRBC: 0 % (ref 0.0–0.2)

## 2023-03-31 LAB — LACTATE DEHYDROGENASE: LDH: 213 U/L — ABNORMAL HIGH (ref 98–192)

## 2023-03-31 LAB — FERRITIN: Ferritin: 6 ng/mL — ABNORMAL LOW (ref 11–307)

## 2023-04-06 NOTE — Progress Notes (Signed)
[facility] [address], [place] [address]   CLINIC:  Medical Oncology/Hematology  PCP:  Farris Has, MD [address] [place] [address] [phone]   REASON FOR VISIT:  Follow-up for polycythemia vera  PRIOR THERAPY: None  CURRENT THERAPY: Aspirin 81 mg daily + therapeutic phlebotomy  INTERVAL HISTORY:   Carmen Edwards 57 y.o. female returns for routine follow-up of polycythemia vera.  She was last seen by Dr. Ellin Saba on 11/29/2022.  At today's visit, she reports feeling fair.  She underwent therapeutic phlebotomy x 1 on 12/08/2022, which she tolerated fairly well apart from some temporary post phlebotomy hypotension.  She felt the same overall after phlebotomy.  She denies any symptoms of DVT or PE.  No aquagenic pruritus, Raynaud's phenomenon, or erythromelalgia.  She reports intermittent headaches, but denies any other vasomotor symptoms such as dizziness, tinnitus, blurry vision, strokelike symptoms, or neuropathy.  For the past month she has been having hot flashes and night sweats several times each week.  No fever, chills, or unintentional weight loss.  She denies any symptoms of splenomegaly such as LUQ abdominal pain, nausea, early satiety.  She has 75% energy and 100% appetite. She endorses that she is maintaining a stable weight.   ASSESSMENT & PLAN:  1.  JAK2 positive polycythemia vera: - Patient evaluated for thrombocytosis ranging between [id number] since February 2013 - She is on baby aspirin since MI on 10/08/2019 - JAK2 V617F test positive on 06/13/2022 - Risk stratification = Low risk (less than 55 years old, no history of DVT) - No indication for cytoreductive therapy at this time. Would consider cytoreductive therapy for uncontrolled PV associated symptoms, progressive increase of leukocytes and/or platelet counts, symptomatic or progressive splenomegaly, and poor tolerance of phlebotomy. - No aquagenic  pruritus/vasomotor symptoms/thrombosis/B symptoms - She does not have any splenomegaly on examination. - Received phlebotomy x 1 on 12/08/2022 - Most recent labs (03/31/2023): Hgb 13.8/hematocrit 44.9%, platelets 747.  Ferritin 6, iron saturation 8%.  LDH 213. - PLAN: We will hold off on phlebotomy.  CBC/phlebtomy in 2 months - Do not recommend treating iron deficiency, as this would further fuel her erythrocytosis/polycythemia vera - Repeat labs with office visit in 1 month - Continue aspirin 81 mg daily  2.  Vaginal bleeding: - She reports irregular menses for the last 3 to 4 years, menstrual bleeding continuously for   40 days.  She had hysteroscopy and D&C on 07/05/2022 - She also developed microcytosis for the last year with low normal hemoglobin, indicating iron deficiency state. - She does not report any vaginal bleeding since IUD placed. - PLAN: Iron tablet discontinued  3.  Social/family history: - She worked Paramedic jobs.  No exposure to chemicals.  Smoked half pack per day and quit about 10 to 15 years ago. - Mother had multiple myeloma.  Maternal aunt and uncle had colon cancer.  Maternal grandmother had uterine cancer.  Maternal grandfather had prostate cancer   PLAN SUMMARY: >> CBC + POSSIBLE phlebotomy in 2 months >> Labs in 4 months = CBC/D, LDH >> OFFICE visit + POSSIBLE phlebotomy in 4 months     REVIEW OF SYSTEMS:   Review of Systems  Constitutional:  Positive for fatigue. Negative for appetite change, chills, diaphoresis, fever and unexpected weight change.  HENT:   Negative for lump/mass and nosebleeds.   Eyes:  Negative for eye problems.  Respiratory:  Positive for shortness of breath (with exertion). Negative for cough  and hemoptysis.   Cardiovascular:  Negative for chest pain, leg swelling and palpitations.  Gastrointestinal:  Negative for abdominal pain, blood in stool, constipation, diarrhea, nausea and vomiting.  Genitourinary:  Negative for hematuria.   Skin:  Negative.   Neurological:  Negative for dizziness, headaches and light-headedness.  Hematological:  Does not bruise/bleed easily.     PHYSICAL EXAM:  ECOG PERFORMANCE STATUS: 1 - Symptomatic but completely ambulatory  There were no vitals filed for this visit. There were no vitals filed for this visit. Physical Exam Constitutional:      Appearance: Normal appearance. She is obese.  Cardiovascular:     Heart sounds: Normal heart sounds.  Pulmonary:     Breath sounds: Normal breath sounds.  Neurological:     General: No focal deficit present.     Mental Status: Mental status is at baseline.  Psychiatric:        Behavior: Behavior normal. Behavior is cooperative.     PAST MEDICAL/SURGICAL HISTORY:  Past Medical History:  Diagnosis Date   Abnormal Pap smear of cervix    Anemia    Coronary artery disease    Essential hypertension    Family history of adverse reaction to anesthesia    mother- has proble,ms with N/v   GERD (gastroesophageal reflux disease)    History of colonic polyps    History of migraine    Occasional, menustral cycle related   Hx of chlamydia infection 1989   Hyperlipidemia    PV (polycythemia vera) (HCC) 05/26/2022   STEMI (ST elevation myocardial infarction) (HCC)    10/08/19 PCI/DESx1 to pLAD   Past Surgical History:  Procedure Laterality Date   BREAST BIOPSY Right    BREAST SURGERY  01/2016   Rt.breast bx--?PASH   COLONOSCOPY WITH PROPOFOL N/A 11/28/2014   Procedure: COLONOSCOPY WITH PROPOFOL;  Surgeon: Florencia Reasons, MD;  Location: [facility];  Service: Endoscopy;  Laterality: N/A;   CORONARY/GRAFT ACUTE MI REVASCULARIZATION N/A 10/08/2019   Procedure: Coronary/Graft Acute MI Revascularization;  Surgeon: Kathleene Hazel, MD;  Location: [facility];  Service: Cardiovascular;  Laterality: N/A;   DILATATION & CURETTAGE/HYSTEROSCOPY WITH MYOSURE N/A 06/23/2022   Procedure: DILATATION & CURETTAGE/HYSTEROSCOPY WITH MYOSURE FIBROID  RESECTION;  Surgeon: Patton Salles, MD;  Location: [facility];  Service: Gynecology;  Laterality: N/A;   Fibroid resection  01/2012   Dr. Tresa Res   HYSTEROSCOPY  01/2012   Dr. Tresa Res   LEFT HEART CATH AND CORONARY ANGIOGRAPHY N/A 10/08/2019   Procedure: LEFT HEART CATH AND CORONARY ANGIOGRAPHY;  Surgeon: Kathleene Hazel, MD;  Location: [facility];  Service: Cardiovascular;  Laterality: N/A;   SKIN LESION EXCISION     --actually frozen by dermatology--benign   TOE SURGERY     Toe nail removed    SOCIAL HISTORY:  Social History   Socioeconomic History   Marital status: Married    Spouse name: Not on file   Number of children: Not on file   Years of education: Not on file   Highest education level: Not on file  Occupational History   Not on file  Tobacco Use   Smoking status: Former    Packs/day: 0    Types: Cigarettes    Quit date: 10/08/2019    Years since quitting: 3.4   Smokeless tobacco: Never   Tobacco comments:    smokes 1/2 pack per year  Vaping Use   Vaping Use: Never used  Substance and Sexual Activity  Alcohol use: No    Alcohol/week: 0.0 standard drinks of alcohol   Drug use: No   Sexual activity: Yes    Partners: Male    Birth control/protection: Other-see comments, I.U.D.    Comment: withdrawal  Other Topics Concern   Not on file  Social History Narrative   Not on file   Social Determinants of Health   Financial Resource Strain: Not on file  Food Insecurity: Not on file  Transportation Needs: Not on file  Physical Activity: Not on file  Stress: Not on file  Social Connections: Not on file  Intimate Partner Violence: Not on file    FAMILY HISTORY:  Family History  Problem Relation Age of Onset   Cancer Mother        multiple myeloma   Hyperlipidemia Mother    Macular degeneration Mother    Cancer - Colon Father    Hypertension Father    Hyperlipidemia Father    Heart disease Father    Hypertension Brother    Heart  disease Brother 43       Heart attack   Cancer - Colon Maternal Aunt    Cancer - Colon Maternal Uncle    Cancer - Colon Maternal Grandmother    Heart disease Paternal Grandfather     CURRENT MEDICATIONS:  Outpatient Encounter Medications as of 04/07/2023  Medication Sig Note   acetaminophen (TYLENOL) 500 MG tablet Take 500 mg by mouth every 6 (six) hours as needed.    atenolol (TENORMIN) 50 MG tablet Take 50 mg by mouth daily with lunch.     atorvastatin (LIPITOR) 80 MG tablet TAKE 1 TABLET BY MOUTH AT BEDTIME . APPOINTMENT REQUIRED FOR FUTURE REFILLS    BAYER ASPIRIN EC LOW DOSE 81 MG EC tablet Take 1 tablet by mouth daily. 06/20/2022: On hold due to surgery   cholecalciferol (VITAMIN D) 1000 units tablet Take 2,000 Units by mouth daily.     Cyanocobalamin (VITAMIN B-12) 2500 MCG SUBL Take 2,500 mcg by mouth daily.    docusate sodium (COLACE) 100 MG capsule Take 100 mg by mouth daily as needed for mild constipation.    ferrous sulfate 325 (65 FE) MG tablet Take 1 tablet (325 mg total) by mouth 2 (two) times daily with a meal. (Patient taking differently: Take 325 mg by mouth daily.)    ketoconazole (NIZORAL) 2 % shampoo Apply 1 Application topically daily as needed for irritation.    Lactobacillus (PROBIOTIC ACIDOPHILUS PO) Take by mouth as needed.    lactobacillus acidophilus (BACID) TABS tablet Take 2 tablets by mouth 3 (three) times daily.    levonorgestrel (MIRENA) 20 MCG/DAY IUD 1 each by Intrauterine route once.    nitroGLYCERIN (NITROSTAT) 0.4 MG SL tablet Place 1 tablet (0.4 mg total) under the tongue every 5 (five) minutes as needed for chest pain. (Patient not taking: Reported on 06/20/2022)    omeprazole (PRILOSEC) 20 MG capsule Take 20 mg by mouth daily with lunch.     No facility-administered encounter medications on file as of 04/07/2023.    ALLERGIES:  Allergies  Allergen Reactions   Amoxicillin Nausea And Vomiting and Other (See Comments)    (high doses) Did it involve  swelling of the face/tongue/throat, SOB, or low BP? No Did it involve sudden or severe rash/hives, skin peeling, or any reaction on the inside of your mouth or nose? No Did you need to seek medical attention at a hospital or doctor's office? No When did it last happen? 2000  If all above answers are "NO", may proceed with cephalosporin use.     LABORATORY DATA:  I have reviewed the labs as listed.  CBC    Component Value Date/Time   WBC 9.5 03/31/2023 1016   RBC 5.54 (H) 03/31/2023 1016   HGB 13.8 03/31/2023 1016   HCT 44.9 03/31/2023 1016   PLT 747 (H) 03/31/2023 1016   MCV 81.0 03/31/2023 1016   MCH 24.9 (L) 03/31/2023 1016   MCHC 30.7 03/31/2023 1016   RDW 14.9 03/31/2023 1016   LYMPHSABS 2.8 03/31/2023 1016   MONOABS 0.8 03/31/2023 1016   EOSABS 0.2 03/31/2023 1016   BASOSABS 0.1 03/31/2023 1016      Latest Ref Rng & Units 06/22/2022    2:36 PM 10/10/2019    5:03 AM 10/09/2019    4:21 AM  CMP  Glucose 70 - 99 mg/dL 96  82  82   BUN 6 - 20 mg/dL 8  12  7    Creatinine 0.44 - 1.00 mg/dL 0.860.70  5.780.74  4.690.56   Sodium 135 - 145 mmol/L 142  137  136   Potassium 3.5 - 5.1 mmol/L 3.7  3.0  4.1   Chloride 98 - 111 mmol/L 114  106  108   CO2 22 - 32 mmol/L 23  21  19    Calcium 8.9 - 10.3 mg/dL 8.9  8.6  8.6   Total Protein 6.5 - 8.1 g/dL   6.1   Total Bilirubin 0.3 - 1.2 mg/dL   1.6   Alkaline Phos 38 - 126 U/L   75   AST 15 - 41 U/L   38   ALT 0 - 44 U/L   23     DIAGNOSTIC IMAGING:  I have independently reviewed the relevant imaging and discussed with the patient.   WRAP UP:  All questions were answered. The patient knows to call the clinic with any problems, questions or concerns.  Medical decision making: Moderate  Time spent on visit: I spent 20 minutes counseling the patient face to face. The total time spent in the appointment was 30 minutes and more than 50% was on counseling.  Carnella GuadalajaraRebekah M Cecily Lawhorne, PA-C  04/07/23 10:18 AM

## 2023-04-07 ENCOUNTER — Encounter: Payer: Self-pay | Admitting: Physician Assistant

## 2023-04-07 ENCOUNTER — Inpatient Hospital Stay (HOSPITAL_BASED_OUTPATIENT_CLINIC_OR_DEPARTMENT_OTHER): Payer: BC Managed Care – PPO | Admitting: Physician Assistant

## 2023-04-07 VITALS — BP 134/88 | HR 65 | Temp 98.8°F | Resp 17 | Ht 65.0 in | Wt 236.1 lb

## 2023-04-07 DIAGNOSIS — D45 Polycythemia vera: Secondary | ICD-10-CM

## 2023-04-07 HISTORY — DX: Polycythemia vera: D45

## 2023-04-07 NOTE — Patient Instructions (Signed)
[facility] **VISIT SUMMARY & IMPORTANT INSTRUCTIONS **   You were seen today by Rojelio Brenner PA-C for your polycythemia vera.   You do not need any phlebotomy today, but we will check your blood and see if you may need phlebotomy in 2 months. Continue to take aspirin 81 mg daily. Call our office if you have any significant symptoms that could indicate you need an earlier phlebotomy, including: Increased headaches and dizziness Intermittent blurry vision and ringing in your ears Red and painful discoloration of your hands or feet Itching after you get out of the shower Seek immediate medical attention if you notice any symptoms of blood clots, including: One-sided leg swelling, which may be associated with redness and pain. Difficulty breathing or chest pain Coughing up blood  FOLLOW-UP APPOINTMENT: I will see you for follow-up visit in 4 months with labs the week before  ** Thank you for trusting me with your healthcare!  I strive to provide all of my patients with quality care at each visit.  If you receive a survey for this visit, I would be so grateful to you for taking the time to provide feedback.  Thank you in advance!  ~ Rache Klimaszewski                   Dr. Doreatha Massed   &   Rojelio Brenner, PA-C   - - - - - - - - - - - - - - - - - -    Thank you for choosing [facility] at [facility] to provide your oncology and hematology care.  To afford each patient quality time with our provider, please arrive at least 15 minutes before your scheduled appointment time.   If you have a lab appointment with the Cancer Center please come in thru the Main Entrance and check in at the main information desk.  You need to re-schedule your appointment should you arrive 10 or more minutes late.  We strive to give you quality time with our providers, and arriving late affects you and other patients whose appointments are after  yours.  Also, if you no show three or more times for appointments you may be dismissed from the clinic at the providers discretion.     Again, thank you for choosing [facility].  Our hope is that these requests will decrease the amount of time that you wait before being seen by our physicians.       _____________________________________________________________  Should you have questions after your visit to [facility], please contact our office at [phone] and follow the prompts.  Our office hours are 8:00 a.m. and 4:30 p.m. Monday - Friday.  Please note that voicemails left after 4:00 p.m. may not be returned until the following business day.  We are closed weekends and major holidays.  You do have access to a nurse 24-7, just call the main number to the clinic [phone] and do not press any options, hold on the line and a nurse will answer the phone.    For prescription refill requests, have your pharmacy contact our office and allow 72 hours.

## 2023-04-10 ENCOUNTER — Other Ambulatory Visit: Payer: Self-pay

## 2023-04-10 DIAGNOSIS — D471 Chronic myeloproliferative disease: Secondary | ICD-10-CM

## 2023-04-10 DIAGNOSIS — D75839 Thrombocytosis, unspecified: Secondary | ICD-10-CM

## 2023-04-10 DIAGNOSIS — D45 Polycythemia vera: Secondary | ICD-10-CM

## 2023-05-11 ENCOUNTER — Encounter: Payer: Self-pay | Admitting: Hematology

## 2023-06-06 ENCOUNTER — Other Ambulatory Visit: Payer: Self-pay

## 2023-06-06 DIAGNOSIS — D45 Polycythemia vera: Secondary | ICD-10-CM

## 2023-06-07 ENCOUNTER — Inpatient Hospital Stay: Payer: BC Managed Care – PPO | Attending: Hematology

## 2023-06-07 DIAGNOSIS — D45 Polycythemia vera: Secondary | ICD-10-CM | POA: Insufficient documentation

## 2023-06-07 DIAGNOSIS — Z87891 Personal history of nicotine dependence: Secondary | ICD-10-CM | POA: Insufficient documentation

## 2023-06-07 LAB — CBC
HCT: 45.3 % (ref 36.0–46.0)
Hemoglobin: 13.7 g/dL (ref 12.0–15.0)
MCH: 24 pg — ABNORMAL LOW (ref 26.0–34.0)
MCHC: 30.2 g/dL (ref 30.0–36.0)
MCV: 79.3 fL — ABNORMAL LOW (ref 80.0–100.0)
Platelets: 811 10*3/uL — ABNORMAL HIGH (ref 150–400)
RBC: 5.71 MIL/uL — ABNORMAL HIGH (ref 3.87–5.11)
RDW: 18.6 % — ABNORMAL HIGH (ref 11.5–15.5)
WBC: 10.7 10*3/uL — ABNORMAL HIGH (ref 4.0–10.5)
nRBC: 0 % (ref 0.0–0.2)

## 2023-06-09 ENCOUNTER — Inpatient Hospital Stay: Payer: BC Managed Care – PPO

## 2023-06-09 DIAGNOSIS — D45 Polycythemia vera: Secondary | ICD-10-CM | POA: Diagnosis not present

## 2023-06-09 NOTE — Patient Instructions (Signed)
[facility]  Discharge Instructions: Thank you for choosing [facility] to provide your oncology and hematology care.  If you have a lab appointment with the Cancer Center - please note that after April 8th, 2024, all labs will be drawn in the cancer center.  You do not have to check in or register with the main entrance as you have in the past but will complete your check-in in the cancer center.  Wear comfortable clothing and clothing appropriate for easy access to any Portacath or PICC line.   We strive to give you quality time with your provider. You may need to reschedule your appointment if you arrive late (15 or more minutes).  Arriving late affects you and other patients whose appointments are after yours.  Also, if you miss three or more appointments without notifying the office, you may be dismissed from the clinic at the provider's discretion.      For prescription refill requests, have your pharmacy contact our office and allow 72 hours for refills to be completed.    Today you received the following phlebotomy.     To help prevent nausea and vomiting after your treatment, we encourage you to take your nausea medication as directed.  BELOW ARE SYMPTOMS THAT SHOULD BE REPORTED IMMEDIATELY: *FEVER GREATER THAN 100.4 F (38 C) OR HIGHER *CHILLS OR SWEATING *NAUSEA AND VOMITING THAT IS NOT CONTROLLED WITH YOUR NAUSEA MEDICATION *UNUSUAL SHORTNESS OF BREATH *UNUSUAL BRUISING OR BLEEDING *URINARY PROBLEMS (pain or burning when urinating, or frequent urination) *BOWEL PROBLEMS (unusual diarrhea, constipation, pain near the anus) TENDERNESS IN MOUTH AND THROAT WITH OR WITHOUT PRESENCE OF ULCERS (sore throat, sores in mouth, or a toothache) UNUSUAL RASH, SWELLING OR PAIN  UNUSUAL VAGINAL DISCHARGE OR ITCHING   Items with * indicate a potential emergency and should be followed up as soon as possible or go to the Emergency Department if any problems  should occur.  Please show the CHEMOTHERAPY ALERT CARD or IMMUNOTHERAPY ALERT CARD at check-in to the Emergency Department and triage nurse.  Should you have questions after your visit or need to cancel or reschedule your appointment, please contact [facility] [phone]  and follow the prompts.  Office hours are 8:00 a.m. to 4:30 p.m. Monday - Friday. Please note that voicemails left after 4:00 p.m. may not be returned until the following business day.  We are closed weekends and major holidays. You have access to a nurse at all times for urgent questions. Please call the main number to the clinic [phone] and follow the prompts.  For any non-urgent questions, you may also contact your provider using MyChart. We now offer e-Visits for anyone 18 and older to request care online for non-urgent symptoms. For details visit [email or web address].   Also download the MyChart app! Go to the app store, search "MyChart", open the app, select [facility], and log in with your MyChart username and password.   

## 2023-06-09 NOTE — Progress Notes (Signed)
Colin Broach presents today for theraputic phlebotomy per MD orders. Last hgb/hct on  was .VSS prior to procedure. Pt reports eating before arrival. Procedure started at 1410 using patients right AC (X1 unsuccessful attempt made in left [facility]). of blood removed. Procedure ended at 1431. Gauze and coban applied to [facility], site clean and dry. VSS upon completion of procedure. Pt denies dizziness, lightheadedness, or feeling faint. Observed for 30 minutes post procedure. Discharged in satisfactory condition with follow up instructions.

## 2023-06-15 ENCOUNTER — Other Ambulatory Visit: Payer: Self-pay | Admitting: Cardiology

## 2023-08-04 ENCOUNTER — Inpatient Hospital Stay: Payer: BC Managed Care – PPO | Attending: Hematology

## 2023-08-04 DIAGNOSIS — Z7982 Long term (current) use of aspirin: Secondary | ICD-10-CM | POA: Diagnosis not present

## 2023-08-04 DIAGNOSIS — Z87891 Personal history of nicotine dependence: Secondary | ICD-10-CM | POA: Insufficient documentation

## 2023-08-04 DIAGNOSIS — Z8042 Family history of malignant neoplasm of prostate: Secondary | ICD-10-CM | POA: Diagnosis not present

## 2023-08-04 DIAGNOSIS — Z800 Family history of malignant neoplasm of digestive organs: Secondary | ICD-10-CM | POA: Diagnosis not present

## 2023-08-04 DIAGNOSIS — Z8049 Family history of malignant neoplasm of other genital organs: Secondary | ICD-10-CM | POA: Diagnosis not present

## 2023-08-04 DIAGNOSIS — D75839 Thrombocytosis, unspecified: Secondary | ICD-10-CM

## 2023-08-04 DIAGNOSIS — D45 Polycythemia vera: Secondary | ICD-10-CM | POA: Diagnosis not present

## 2023-08-04 DIAGNOSIS — D471 Chronic myeloproliferative disease: Secondary | ICD-10-CM

## 2023-08-04 DIAGNOSIS — Z807 Family history of other malignant neoplasms of lymphoid, hematopoietic and related tissues: Secondary | ICD-10-CM | POA: Diagnosis not present

## 2023-08-04 LAB — CBC WITH DIFFERENTIAL/PLATELET
Abs Immature Granulocytes: 0.1 10*3/uL — ABNORMAL HIGH (ref 0.00–0.07)
Basophils Absolute: 0.1 10*3/uL (ref 0.0–0.1)
Basophils Relative: 1 %
Eosinophils Absolute: 0.2 10*3/uL (ref 0.0–0.5)
Eosinophils Relative: 2 %
HCT: 43.2 % (ref 36.0–46.0)
Hemoglobin: 13 g/dL (ref 12.0–15.0)
Immature Granulocytes: 1 %
Lymphocytes Relative: 30 %
Lymphs Abs: 3 10*3/uL (ref 0.7–4.0)
MCH: 23.5 pg — ABNORMAL LOW (ref 26.0–34.0)
MCHC: 30.1 g/dL (ref 30.0–36.0)
MCV: 78 fL — ABNORMAL LOW (ref 80.0–100.0)
Monocytes Absolute: 0.8 10*3/uL (ref 0.1–1.0)
Monocytes Relative: 8 %
Neutro Abs: 5.9 10*3/uL (ref 1.7–7.7)
Neutrophils Relative %: 58 %
Platelets: 714 10*3/uL — ABNORMAL HIGH (ref 150–400)
RBC: 5.54 MIL/uL — ABNORMAL HIGH (ref 3.87–5.11)
RDW: 16 % — ABNORMAL HIGH (ref 11.5–15.5)
WBC: 10.1 10*3/uL (ref 4.0–10.5)
nRBC: 0 % (ref 0.0–0.2)

## 2023-08-04 LAB — LACTATE DEHYDROGENASE: LDH: 183 U/L (ref 98–192)

## 2023-08-10 ENCOUNTER — Inpatient Hospital Stay (HOSPITAL_BASED_OUTPATIENT_CLINIC_OR_DEPARTMENT_OTHER): Payer: BC Managed Care – PPO | Admitting: Oncology

## 2023-08-10 ENCOUNTER — Inpatient Hospital Stay: Payer: BC Managed Care – PPO

## 2023-08-10 VITALS — BP 140/85 | HR 69 | Temp 97.6°F | Resp 16 | Wt 236.4 lb

## 2023-08-10 DIAGNOSIS — D45 Polycythemia vera: Secondary | ICD-10-CM

## 2023-08-10 NOTE — Progress Notes (Signed)
[facility] [address], [place] [address]   CLINIC:  Medical Oncology/Hematology  PCP:  Farris Has, MD [address] [place] [address] [phone]   REASON FOR VISIT:  Follow-up for polycythemia vera  PRIOR THERAPY: None  CURRENT THERAPY: Aspirin 81 mg daily + therapeutic phlebotomy  INTERVAL HISTORY:   [name] 57 y.o. female returns for routine follow-up of polycythemia vera.  She was last seen by Rojelio Brenner, PA on 04/07/2023.  In the interim, denies any recent hospitalizations or surgeries.  Denies any changes to baseline health.  Reports an appetite and energy levels at 100%.  Denies any pain.  Patient has received intermittent therapeutic phlebotomies last on 06/09/2023.  Has occasional dizziness and sweating following a phlebotomy.  Reports she sits for about 45 minutes after her infusion with resolution of her symptoms.   Reports prior to her last infusion, she did have increased headaches and some dizziness which resolved a few days after her phlebotomy. She denies any symptoms of DVT or PE.  No aquagenic pruritus, Raynaud's phenomenon, or erythromelalgia.  She reports intermittent headaches, but denies any other vasomotor symptoms such as dizziness, tinnitus, blurry vision, strokelike symptoms, or neuropathy.      ASSESSMENT & PLAN:  1.  JAK2 positive polycythemia vera: - Patient evaluated for thrombocytosis ranging between [id number] since February 2013 - She is on baby aspirin since MI on 10/08/2019 - JAK2 V617F test positive on 06/13/2022. - Risk stratification = Low risk (less than 91 years old, no history of DVT) - No indication for cytoreductive therapy at this time. Would consider cytoreductive therapy for uncontrolled PV associated symptoms, progressive increase of leukocytes and/or platelet counts, symptomatic or progressive splenomegaly, and poor tolerance of phlebotomy. - No aquagenic  pruritus/vasomotor symptoms/thrombosis/B symptoms - She does not have any splenomegaly on examination. - Received phlebotomy x 1 on 06/09/2023. -Most recent lab work from 08/04/2023 shows a hemoglobin of 13.0, hematocrit 43.2 and platelet count of 714,000.  LDH is 183. - PLAN:  -We will hold off on phlebotomy.   -CBC/phlebtomy every 2 months and will see a provider in 6 months with an assessment, labs +/- a phlebotomy. -Would recommend phlebotomy for platelet count greater than 800 and or symptomatic.  This has been approximately every 4-5 months.  - Do not recommend treating iron deficiency, as this would further fuel her erythrocytosis/polycythemia vera - Continue aspirin 81 mg daily.  2.  Vaginal bleeding: - She reports irregular menses for the last 3 to 4 years, menstrual bleeding continuously for  40 days.  She had hysteroscopy and D&C on 07/05/2022. - She also developed microcytosis for the last year with low normal hemoglobin, indicating iron deficiency state. - She does not report any vaginal bleeding since IUD placed.  3.  Social/family history: - She worked Paramedic jobs.  No exposure to chemicals.  Smoked half pack per day and quit about 10 to 15 years ago. - Mother had multiple myeloma.  Maternal aunt and uncle had colon cancer.  Maternal grandmother had uterine cancer.  Maternal grandfather had prostate cancer   PLAN SUMMARY: >> No phlebotomy today. >> Return to clinic every 2 months for labs +/- phlebotomy and in 6 months for labs, assessment +/- phlebotomy.     REVIEW OF SYSTEMS:   Review of Systems  Neurological:  Positive for dizziness and headaches.     PHYSICAL EXAM:  ECOG PERFORMANCE STATUS: 1 - Symptomatic but completely ambulatory  Vitals:   08/10/23 1304  BP: (!) 145/83  Pulse: 69  Resp: 16  Temp: 97.6 F (36.4 C)  SpO2: 97%   Filed Weights   08/10/23 1304  Weight: 236 lb 6.4 oz (107.2 kg)   Physical Exam Constitutional:      Appearance: Normal  appearance.  Cardiovascular:     Rate and Rhythm: Normal rate and regular rhythm.  Pulmonary:     Effort: Pulmonary effort is normal.     Breath sounds: Normal breath sounds.  Abdominal:     General: Bowel sounds are normal.     Palpations: Abdomen is soft.  Musculoskeletal:        General: No swelling. Normal range of motion.  Neurological:     Mental Status: She is alert and oriented to person, place, and time. Mental status is at baseline.     PAST MEDICAL/SURGICAL HISTORY:  Past Medical History:  Diagnosis Date   Abnormal Pap smear of cervix    Anemia    Coronary artery disease    Essential hypertension    Family history of adverse reaction to anesthesia    mother- has proble,ms with N/v   GERD (gastroesophageal reflux disease)    History of colonic polyps    History of migraine    Occasional, menustral cycle related   Hx of chlamydia infection 1989   Hyperlipidemia    Polycythemia vera (HCC) 04/07/2023   PV (polycythemia vera) (HCC) 05/26/2022   STEMI (ST elevation myocardial infarction) (HCC)    10/08/19 PCI/DESx1 to pLAD   Past Surgical History:  Procedure Laterality Date   BREAST BIOPSY Right    BREAST SURGERY  01/2016   Rt.breast bx--?PASH   COLONOSCOPY WITH PROPOFOL N/A 11/28/2014   Procedure: COLONOSCOPY WITH PROPOFOL;  Surgeon: Florencia Reasons, MD;  Location: [facility];  Service: Endoscopy;  Laterality: N/A;   CORONARY/GRAFT ACUTE MI REVASCULARIZATION N/A 10/08/2019   Procedure: Coronary/Graft Acute MI Revascularization;  Surgeon: Kathleene Hazel, MD;  Location: [facility];  Service: Cardiovascular;  Laterality: N/A;   DILATATION & CURETTAGE/HYSTEROSCOPY WITH MYOSURE N/A 06/23/2022   Procedure: DILATATION & CURETTAGE/HYSTEROSCOPY WITH MYOSURE FIBROID RESECTION;  Surgeon: Patton Salles, MD;  Location: [facility];  Service: Gynecology;  Laterality: N/A;   Fibroid resection  01/2012   Dr. Tresa Res   HYSTEROSCOPY  01/2012   Dr. Tresa Res    LEFT HEART CATH AND CORONARY ANGIOGRAPHY N/A 10/08/2019   Procedure: LEFT HEART CATH AND CORONARY ANGIOGRAPHY;  Surgeon: Kathleene Hazel, MD;  Location: [facility];  Service: Cardiovascular;  Laterality: N/A;   SKIN LESION EXCISION     --actually frozen by dermatology--benign   TOE SURGERY     Toe nail removed    SOCIAL HISTORY:  Social History   Socioeconomic History   Marital status: Married    Spouse name: Not on file   Number of children: Not on file   Years of education: Not on file   Highest education level: Not on file  Occupational History   Not on file  Tobacco Use   Smoking status: Former    Current packs/day: 0.00    Types: Cigarettes    Quit date: 10/08/2019    Years since quitting: 3.8   Smokeless tobacco: Never   Tobacco comments:    smokes 1/2 pack per year  Vaping Use   Vaping status: Never Used  Substance and Sexual Activity   Alcohol use: No    Alcohol/week: 0.0  standard drinks of alcohol   Drug use: No   Sexual activity: Yes    Partners: Male    Birth control/protection: Other-see comments, I.U.D.    Comment: withdrawal  Other Topics Concern   Not on file  Social History Narrative   Not on file   Social Determinants of Health   Financial Resource Strain: Not on file  Food Insecurity: Not on file  Transportation Needs: Not on file  Physical Activity: Not on file  Stress: Not on file  Social Connections: Not on file  Intimate Partner Violence: Not on file    FAMILY HISTORY:  Family History  Problem Relation Age of Onset   Cancer Mother        multiple myeloma   Hyperlipidemia Mother    Macular degeneration Mother    Cancer - Colon Father    Hypertension Father    Hyperlipidemia Father    Heart disease Father    Hypertension Brother    Heart disease Brother 39       Heart attack   Cancer - Colon Maternal Aunt    Cancer - Colon Maternal Uncle    Cancer - Colon Maternal Grandmother    Heart disease Paternal  Grandfather     CURRENT MEDICATIONS:  Outpatient Encounter Medications as of 08/10/2023  Medication Sig Note   acetaminophen (TYLENOL) 500 MG tablet Take 500 mg by mouth every 6 (six) hours as needed.    atenolol (TENORMIN) 50 MG tablet Take 50 mg by mouth daily with lunch.     atorvastatin (LIPITOR) 80 MG tablet TAKE 1 TABLET BY MOUTH AT BEDTIME    BAYER ASPIRIN EC LOW DOSE 81 MG EC tablet Take 1 tablet by mouth daily. 06/20/2022: On hold due to surgery   cholecalciferol (VITAMIN D) 1000 units tablet Take 2,000 Units by mouth daily.     Cyanocobalamin (VITAMIN B-12) 2500 MCG SUBL Take 2,500 mcg by mouth daily.    docusate sodium (COLACE) 100 MG capsule Take 100 mg by mouth daily as needed for mild constipation.    ferrous sulfate 325 (65 FE) MG tablet Take 1 tablet (325 mg total) by mouth 2 (two) times daily with a meal. (Patient taking differently: Take 325 mg by mouth daily.)    ketoconazole (NIZORAL) 2 % shampoo Apply 1 Application topically daily as needed for irritation.    Lactobacillus (PROBIOTIC ACIDOPHILUS PO) Take by mouth as needed.    lactobacillus acidophilus (BACID) TABS tablet Take 2 tablets by mouth 3 (three) times daily.    levonorgestrel (MIRENA) 20 MCG/DAY IUD 1 each by Intrauterine route once.    omeprazole (PRILOSEC) 20 MG capsule Take 20 mg by mouth daily with lunch.     nitroGLYCERIN (NITROSTAT) 0.4 MG SL tablet Place 1 tablet (0.4 mg total) under the tongue every 5 (five) minutes as needed for chest pain. (Patient not taking: Reported on 06/20/2022)    No facility-administered encounter medications on file as of 08/10/2023.    ALLERGIES:  Allergies  Allergen Reactions   Amoxicillin Nausea And Vomiting and Other (See Comments)    (high doses) Did it involve swelling of the face/tongue/throat, SOB, or low BP? No Did it involve sudden or severe rash/hives, skin peeling, or any reaction on the inside of your mouth or nose? No Did you need to seek medical attention at a  hospital or doctor's office? No When did it last happen? 2000      If all above answers are "NO", may proceed with  cephalosporin use.     LABORATORY DATA:  I have reviewed the labs as listed.  CBC    Component Value Date/Time   WBC 10.1 08/04/2023 1030   RBC 5.54 (H) 08/04/2023 1030   HGB 13.0 08/04/2023 1030   HCT 43.2 08/04/2023 1030   PLT 714 (H) 08/04/2023 1030   MCV 78.0 (L) 08/04/2023 1030   MCH 23.5 (L) 08/04/2023 1030   MCHC 30.1 08/04/2023 1030   RDW 16.0 (H) 08/04/2023 1030   LYMPHSABS 3.0 08/04/2023 1030   MONOABS 0.8 08/04/2023 1030   EOSABS 0.2 08/04/2023 1030   BASOSABS 0.1 08/04/2023 1030      Latest Ref Rng & Units 06/22/2022    2:36 PM 10/10/2019    5:03 AM 10/09/2019    4:21 AM  CMP  Glucose 70 - 99 mg/dL 96  82  82   BUN 6 - 20 mg/dL 8  12  7    Creatinine 0.44 - 1.00 mg/dL 9.52  8.41  3.24   Sodium 135 - 145 mmol/L 142  137  136   Potassium 3.5 - 5.1 mmol/L 3.7  3.0  4.1   Chloride 98 - 111 mmol/L 114  106  108   CO2 22 - 32 mmol/L 23  21  19    Calcium 8.9 - 10.3 mg/dL 8.9  8.6  8.6   Total Protein 6.5 - 8.1 g/dL   6.1   Total Bilirubin 0.3 - 1.2 mg/dL   1.6   Alkaline Phos 38 - 126 U/L   75   AST 15 - 41 U/L   38   ALT 0 - 44 U/L   23     DIAGNOSTIC IMAGING:  I have independently reviewed the relevant imaging and discussed with the patient.   WRAP UP:  All questions were answered. The patient knows to call the clinic with any problems, questions or concerns.  Medical decision making: Moderate  Time spent on visit: I spent 20 minutes counseling the patient face to face. The total time spent in the appointment was 30 minutes and more than 50% was on counseling.  Mauro Kaufmann, NP  08/10/23 1:08 PM

## 2023-08-10 NOTE — Progress Notes (Signed)
Hold phlebotomy today per Durenda Hurt, NP. Patient will RTC as scheduled for repeat labs and possible phlebotomy.

## 2023-08-24 NOTE — Progress Notes (Signed)
    Cardiology Office Note  Date: 08/25/2023   ID: Chyrle, Bobier [birth year], MRN [id number]  History of Present Illness: Carmen Edwards is a 57 y.o. female last seen in June 2023 by Ms. Barrett PA-C, I reviewed the note.  She is here for a follow-up visit.  Reports no angina or interval nitroglycerin use, stable NYHA class II dyspnea.  I reviewed her medications.  Cardiac regimen includes aspirin, atenolol, Lipitor, and as needed nitroglycerin which needs a refill.  ECG today shows normal sinus rhythm.  She does not report any palpitations or syncope.  I reviewed her most recent lab work, she is due for a follow-up lipid panel with PCP later this year.  Physical Exam: VS:  BP 122/80   Pulse 74   Ht 5\' 6"  (1.676 m)   Wt 233 lb 12.8 oz (106.1 kg)   SpO2 95%   BMI 37.74 kg/m , BMI Body mass index is 37.74 kg/m.  Wt Readings from Last 3 Encounters:  08/25/23 233 lb 12.8 oz (106.1 kg)  08/10/23 236 lb 6.4 oz (107.2 kg)  04/07/23 236 lb 1.6 oz (107.1 kg)    General: Patient appears comfortable at rest. HEENT: Conjunctiva and lids normal. Neck: Supple, no elevated JVP or carotid bruits. Lungs: Clear to auscultation, nonlabored breathing at rest. Cardiac: Regular rate and rhythm, no S3 or significant systolic murmur. Extremities: No pitting edema.  ECG:  An ECG dated 06/20/2022 was personally reviewed today and demonstrated:  Sinus rhythm with nonspecific T wave changes.  Labwork: 08/04/2023: Hemoglobin 13.0; Platelets 714     Component Value Date/Time   CHOL 144 01/10/2020 1141   TRIG 110 01/10/2020 1141   HDL 50 01/10/2020 1141   CHOLHDL 2.9 01/10/2020 1141   VLDL 18 10/08/2019 1117   LDLCALC 74 01/10/2020 1141   Other Studies Reviewed Today:  No interval cardiac testing for review today.  Assessment and Plan:  1.  CAD status post anterior STEMI with DES to the proximal LAD in October 2020.  LVEF 60 to 65% at that time.  She reports no  active angina or nitroglycerin use, refill for bride for fresh bottle.  Continue aspirin, atenolol, and Lipitor.  2.  Essential hypertension.  Blood pressure well-controlled today.  No changes made to current regimen.  3.  Mixed hyperlipidemia.  Continue Lipitor 80 mg daily.  Follow-up FLP with PCP later this year.  Disposition:  Follow up  1 year, sooner if needed.  Signed, Jonelle Sidle, M.D., F.A.C.C. [facility]

## 2023-08-25 ENCOUNTER — Encounter: Payer: Self-pay | Admitting: Cardiology

## 2023-08-25 ENCOUNTER — Ambulatory Visit: Payer: BC Managed Care – PPO | Admitting: Cardiology

## 2023-08-25 VITALS — BP 122/80 | HR 74 | Ht 66.0 in | Wt 233.8 lb

## 2023-08-25 DIAGNOSIS — E782 Mixed hyperlipidemia: Secondary | ICD-10-CM | POA: Diagnosis not present

## 2023-08-25 DIAGNOSIS — I25119 Atherosclerotic heart disease of native coronary artery with unspecified angina pectoris: Secondary | ICD-10-CM

## 2023-08-25 DIAGNOSIS — I10 Essential (primary) hypertension: Secondary | ICD-10-CM | POA: Diagnosis not present

## 2023-08-25 MED ORDER — NITROGLYCERIN 0.4 MG SL SUBL: 0.4000 mg | SUBLINGUAL_TABLET | SUBLINGUAL | 3 refills | Status: AC | PRN

## 2023-08-25 NOTE — Patient Instructions (Signed)
Medication Instructions:  Your physician recommends that you continue on your current medications as directed. Please refer to the Current Medication list given to you today.   Labwork: None today  Testing/Procedures: None today  Follow-Up: 1 year  Any Other Special Instructions Will Be Listed Below (If Applicable).  If you need a refill on your cardiac medications before your next appointment, please call your pharmacy.  

## 2023-09-04 ENCOUNTER — Encounter: Payer: Self-pay | Admitting: Hematology

## 2023-09-22 ENCOUNTER — Other Ambulatory Visit: Payer: Self-pay | Admitting: Cardiology

## 2023-10-11 ENCOUNTER — Other Ambulatory Visit: Payer: Self-pay | Admitting: Family Medicine

## 2023-10-11 DIAGNOSIS — Z1231 Encounter for screening mammogram for malignant neoplasm of breast: Secondary | ICD-10-CM

## 2023-10-12 ENCOUNTER — Other Ambulatory Visit: Payer: Self-pay

## 2023-10-12 DIAGNOSIS — D45 Polycythemia vera: Secondary | ICD-10-CM

## 2023-10-12 NOTE — Progress Notes (Signed)
Lab orders entered

## 2023-10-13 ENCOUNTER — Inpatient Hospital Stay: Payer: BC Managed Care – PPO | Attending: Hematology

## 2023-10-13 DIAGNOSIS — D45 Polycythemia vera: Secondary | ICD-10-CM | POA: Insufficient documentation

## 2023-10-13 LAB — CBC WITH DIFFERENTIAL/PLATELET
Abs Immature Granulocytes: 0.03 10*3/uL (ref 0.00–0.07)
Basophils Absolute: 0.1 10*3/uL (ref 0.0–0.1)
Basophils Relative: 1 %
Eosinophils Absolute: 0.3 10*3/uL (ref 0.0–0.5)
Eosinophils Relative: 2 %
HCT: 46.3 % — ABNORMAL HIGH (ref 36.0–46.0)
Hemoglobin: 13.8 g/dL (ref 12.0–15.0)
Immature Granulocytes: 0 %
Lymphocytes Relative: 30 %
Lymphs Abs: 3.2 10*3/uL (ref 0.7–4.0)
MCH: 23 pg — ABNORMAL LOW (ref 26.0–34.0)
MCHC: 29.8 g/dL — ABNORMAL LOW (ref 30.0–36.0)
MCV: 77.3 fL — ABNORMAL LOW (ref 80.0–100.0)
Monocytes Absolute: 0.8 10*3/uL (ref 0.1–1.0)
Monocytes Relative: 8 %
Neutro Abs: 6.2 10*3/uL (ref 1.7–7.7)
Neutrophils Relative %: 59 %
Platelets: 767 10*3/uL — ABNORMAL HIGH (ref 150–400)
RBC: 5.99 MIL/uL — ABNORMAL HIGH (ref 3.87–5.11)
RDW: 18.5 % — ABNORMAL HIGH (ref 11.5–15.5)
WBC: 10.6 10*3/uL — ABNORMAL HIGH (ref 4.0–10.5)
nRBC: 0 % (ref 0.0–0.2)

## 2023-10-13 LAB — LACTATE DEHYDROGENASE: LDH: 190 U/L (ref 98–192)

## 2023-10-13 LAB — FERRITIN: Ferritin: 5 ng/mL — ABNORMAL LOW (ref 11–307)

## 2023-10-13 LAB — IRON AND TIBC
Iron: 39 ug/dL (ref 28–170)
Saturation Ratios: 9 % — ABNORMAL LOW (ref 10.4–31.8)
TIBC: 453 ug/dL — ABNORMAL HIGH (ref 250–450)
UIBC: 414 ug/dL

## 2023-10-13 NOTE — Progress Notes (Signed)
Blood pressure is 108/64. Patient is asymptomatic. Per, Dr. Anders Simmonds, ok to discharge.

## 2023-10-13 NOTE — Progress Notes (Signed)
Patient presents today for phlebotomy per MD orders. HCT noted to be 46.3.  Patient ate prior to appointment time. Phlebotomy procedure started at 1245 and ended at 1300. 269 cc removed.  Vital signs WNL.  1305 patient became diaphoretic and BP was 64/47.  MD notified.  Patient lying flat with cool rag to forehead.    1318 BP rechecked and BP 80/63.  Patient starting to feel better.  1320 MD in the room patient feeling better.  BP now 115/63.  1345 patients BP is 180/64.  MD notified that patient is feeling back to normal.  No dizziness or lightheadedness.  Per Dr. Anders Simmonds patient okay to be discharged to home.

## 2023-10-13 NOTE — Patient Instructions (Signed)
[facility]  Discharge Instructions: Thank you for choosing [facility] to provide your oncology and hematology care.  If you have a lab appointment with the Cancer Center - please note that after April 8th, 2024, all labs will be drawn in the cancer center.  You do not have to check in or register with the main entrance as you have in the past but will complete your check-in in the cancer center.  Wear comfortable clothing and clothing appropriate for easy access to any Portacath or PICC line.   We strive to give you quality time with your provider. You may need to reschedule your appointment if you arrive late (15 or more minutes).  Arriving late affects you and other patients whose appointments are after yours.  Also, if you miss three or more appointments without notifying the office, you may be dismissed from the clinic at the provider's discretion.      For prescription refill requests, have your pharmacy contact our office and allow 72 hours for refills to be completed.    Today you received the following chemotherapy and/or immunotherapy agents theraputic Phlebotmoy      To help prevent nausea and vomiting after your treatment, we encourage you to take your nausea medication as directed.  BELOW ARE SYMPTOMS THAT SHOULD BE REPORTED IMMEDIATELY: *FEVER GREATER THAN 100.4 F (38 C) OR HIGHER *CHILLS OR SWEATING *NAUSEA AND VOMITING THAT IS NOT CONTROLLED WITH YOUR NAUSEA MEDICATION *UNUSUAL SHORTNESS OF BREATH *UNUSUAL BRUISING OR BLEEDING *URINARY PROBLEMS (pain or burning when urinating, or frequent urination) *BOWEL PROBLEMS (unusual diarrhea, constipation, pain near the anus) TENDERNESS IN MOUTH AND THROAT WITH OR WITHOUT PRESENCE OF ULCERS (sore throat, sores in mouth, or a toothache) UNUSUAL RASH, SWELLING OR PAIN  UNUSUAL VAGINAL DISCHARGE OR ITCHING   Items with * indicate a potential emergency and should be followed up as soon as possible or  go to the Emergency Department if any problems should occur.  Please show the CHEMOTHERAPY ALERT CARD or IMMUNOTHERAPY ALERT CARD at check-in to the Emergency Department and triage nurse.  Should you have questions after your visit or need to cancel or reschedule your appointment, please contact [facility] [phone]  and follow the prompts.  Office hours are 8:00 a.m. to 4:30 p.m. Monday - Friday. Please note that voicemails left after 4:00 p.m. may not be returned until the following business day.  We are closed weekends and major holidays. You have access to a nurse at all times for urgent questions. Please call the main number to the clinic [phone] and follow the prompts.  For any non-urgent questions, you may also contact your provider using MyChart. We now offer e-Visits for anyone 73 and older to request care online for non-urgent symptoms. For details visit [email or web address].   Also download the MyChart app! Go to the app store, search "MyChart", open the app, select [place], and log in with your MyChart username and password.

## 2023-11-03 ENCOUNTER — Ambulatory Visit
Admission: RE | Admit: 2023-11-03 | Discharge: 2023-11-03 | Disposition: A | Discharge status: Home / Self Care | Payer: BC Managed Care – PPO | Source: Ambulatory Visit | Attending: Family Medicine | Admitting: Family Medicine

## 2023-11-03 DIAGNOSIS — Z1231 Encounter for screening mammogram for malignant neoplasm of breast: Secondary | ICD-10-CM

## 2023-12-07 ENCOUNTER — Other Ambulatory Visit: Payer: Self-pay

## 2023-12-07 DIAGNOSIS — D45 Polycythemia vera: Secondary | ICD-10-CM

## 2023-12-08 ENCOUNTER — Inpatient Hospital Stay: Payer: BC Managed Care – PPO | Attending: Hematology

## 2023-12-08 ENCOUNTER — Inpatient Hospital Stay: Payer: BC Managed Care – PPO

## 2023-12-08 DIAGNOSIS — D45 Polycythemia vera: Secondary | ICD-10-CM | POA: Insufficient documentation

## 2023-12-08 LAB — CBC
HCT: 45.3 % (ref 36.0–46.0)
Hemoglobin: 13.4 g/dL (ref 12.0–15.0)
MCH: 22.8 pg — ABNORMAL LOW (ref 26.0–34.0)
MCHC: 29.6 g/dL — ABNORMAL LOW (ref 30.0–36.0)
MCV: 77.2 fL — ABNORMAL LOW (ref 80.0–100.0)
Platelets: 813 10*3/uL — ABNORMAL HIGH (ref 150–400)
RBC: 5.87 MIL/uL — ABNORMAL HIGH (ref 3.87–5.11)
RDW: 17.4 % — ABNORMAL HIGH (ref 11.5–15.5)
WBC: 12 10*3/uL — ABNORMAL HIGH (ref 4.0–10.5)
nRBC: 0 % (ref 0.0–0.2)

## 2023-12-08 NOTE — Progress Notes (Signed)
Patient for phlebotomy today.    Carmen Edwards presents today for phlebotomy per MD orders. Phlebotomy procedure started at 1307 and ended at 1318. 234 cc removed. Patient tolerated procedure well. IV needle removed intact.  Patient to be rescheduled for completion of phlebotomy next Friday.  Provider made aware.  Patient discharged with VSS and no complaints voiced.  Family at side.

## 2023-12-15 ENCOUNTER — Inpatient Hospital Stay: Payer: BC Managed Care – PPO

## 2023-12-15 DIAGNOSIS — D45 Polycythemia vera: Secondary | ICD-10-CM | POA: Diagnosis not present

## 2023-12-15 NOTE — Progress Notes (Signed)
Carmen Edwards presents for therapeutic phlebotomy per MD orders. Last HGB 13.4 / HCT 45.3 on 12-08-2023 . Vital signs stable prior to procedure. Procedure started at 14:24 pm and ended at 14:30 am. of blood removed. Patient denies any dizziness , lightheadedness, or feeling faint.  Gauze and coban applied to site. Vital signs stable at completion of procedure. Patient has no complaints at this time. Alert and oriented x 3. Discharged in stable condition.

## 2023-12-15 NOTE — Patient Instructions (Signed)
[facility] [place] - A DEPT OF [facility]  Discharge Instructions: Thank you for choosing [facility] to provide your oncology and hematology care.  If you have a lab appointment with the Cancer Center - please note that after April 8th, 2024, all labs will be drawn in the cancer center.  You do not have to check in or register with the main entrance as you have in the past but will complete your check-in in the cancer center.  Wear comfortable clothing and clothing appropriate for easy access to any Portacath or PICC line.   We strive to give you quality time with your provider. You may need to reschedule your appointment if you arrive late (15 or more minutes).  Arriving late affects you and other patients whose appointments are after yours.  Also, if you miss three or more appointments without notifying the office, you may be dismissed from the clinic at the provider's discretion.      For prescription refill requests, have your pharmacy contact our office and allow 72 hours for refills to be completed.    Today you received the following chemotherapy and/or immunotherapy agents Phlebotomy. Therapeutic Phlebotomy, Care After The following information offers guidance on how to care for yourself after your procedure. Your health care provider may also give you more specific instructions. If you have problems or questions, contact your health care provider. What can I expect after the procedure? After therapeutic phlebotomy, it is common to have: Light-headedness or dizziness. You may feel faint. Nausea. Tiredness (fatigue). Follow these instructions at home: Eating and drinking Be sure to eat well-balanced meals for the next 24 hours. Drink enough fluid to keep your urine pale yellow. Avoid drinking alcohol on the day that you had the procedure. Activity  Return to your normal activities as told by your health care provider. Most people can go back  to their normal activities right away. Avoid activities that take a lot of effort for about 5 hours after the procedure. Athletes should avoid strenuous exercise for at least 12 hours. Avoid heavy lifting or pulling for about 5 hours after the procedure. Do not lift anything that is heavier than 10 lb (4.5 kg). Change positions slowly for the remainder of the day, like from sitting to standing. This can help prevent light-headedness or fainting. If you feel light-headed, lie down until the feeling goes away. Needle insertion site care  Keep your bandage (dressing) dry. You can remove the bandage after about 5 hours or as told by your health care provider. If you have bleeding from the needle insertion site, raise (elevate) your arm and press firmly on the site until the bleeding stops. If you have bruising at the site, apply ice to the area. To do this: Put ice in a plastic bag. Place a towel between your skin and the bag. Leave the ice on for 20 minutes, 2-3 times a day for the first 24 hours. Remove the ice if your skin turns bright red so you do not damage the area. If the swelling does not go away after 24 hours, apply a warm, moist cloth (warm compress) to the area for 20 minutes, 2-3 times a day. General instructions Do not use any products that contain nicotine or tobacco, like cigarettes, chewing tobacco, and vaping devices, such as e-cigarettes, for at least 30 minutes after the procedure. If you need help quitting, ask your health care provider. Keep all follow-up visits.  You may need to continue having regular blood tests and therapeutic phlebotomy treatments as directed. Contact a health care provider if: You have redness, swelling, or pain at the needle insertion site. Fluid or blood is coming from the needle insertion site. Pus or a bad smell is coming from the needle insertion site. The needle insertion site feels warm to the touch. You feel light-headed, dizzy, or nauseous, and  the feeling does not go away. You have new bruising at the needle insertion site. You feel weaker than normal. You have a fever or chills. Get help right away if: You have chest pain. You have trouble breathing. You have severe nausea or vomiting. Summary After the procedure, it is common to have some light-headedness, dizziness, nausea, or tiredness (fatigue). Be sure to eat well-balanced meals for the next 24 hours. Drink enough fluid to keep your urine pale yellow. Return to your normal activities as told by your health care provider. Keep all follow-up visits. You may need to continue having regular blood tests and therapeutic phlebotomy treatments as directed. This information is not intended to replace advice given to you by your health care provider. Make sure you discuss any questions you have with your health care provider. Document Revised: 06/09/2021 Document Reviewed: 06/09/2021 Elsevier Patient Education  2024 [employer]       To help prevent nausea and vomiting after your treatment, we encourage you to take your nausea medication as directed.  BELOW ARE SYMPTOMS THAT SHOULD BE REPORTED IMMEDIATELY: *FEVER GREATER THAN 100.4 F (38 C) OR HIGHER *CHILLS OR SWEATING *NAUSEA AND VOMITING THAT IS NOT CONTROLLED WITH YOUR NAUSEA MEDICATION *UNUSUAL SHORTNESS OF BREATH *UNUSUAL BRUISING OR BLEEDING *URINARY PROBLEMS (pain or burning when urinating, or frequent urination) *BOWEL PROBLEMS (unusual diarrhea, constipation, pain near the anus) TENDERNESS IN MOUTH AND THROAT WITH OR WITHOUT PRESENCE OF ULCERS (sore throat, sores in mouth, or a toothache) UNUSUAL RASH, SWELLING OR PAIN  UNUSUAL VAGINAL DISCHARGE OR ITCHING   Items with * indicate a potential emergency and should be followed up as soon as possible or go to the Emergency Department if any problems should occur.  Please show the CHEMOTHERAPY ALERT CARD or IMMUNOTHERAPY ALERT CARD at check-in to the Emergency  Department and triage nurse.  Should you have questions after your visit or need to cancel or reschedule your appointment, please contact [facility] [place] - A DEPT OF Eligha Bridegroom [facility] [phone]  and follow the prompts.  Office hours are 8:00 a.m. to 4:30 p.m. Monday - Friday. Please note that voicemails left after 4:00 p.m. may not be returned until the following business day.  We are closed weekends and major holidays. You have access to a nurse at all times for urgent questions. Please call the main number to the clinic [phone] and follow the prompts.  For any non-urgent questions, you may also contact your provider using MyChart. We now offer e-Visits for anyone 81 and older to request care online for non-urgent symptoms. For details visit [email or web address].   Also download the MyChart app! Go to the app store, search "MyChart", open the app, select [place], and log in with your MyChart username and password.

## 2024-01-02 NOTE — Progress Notes (Unsigned)
58 y.o. G53P1011 Married Caucasian female here for annual exam.    Having phlebotomies for her polycythemia.   No menstrual periods with her Mirena IUD.  Some hot flashes. Patient had abnormal perimenopausal bleeding prior to placement of her IUD.  Leaks with sneeze, cough, laugh, and exertion.  Wearing a pad.  Some vulvar irritation.   Did pelvic floor therapy, which helped in the past.   Caring for her mother who lives next door.  PCP: Farris Has, MD   No LMP recorded. (Menstrual status: IUD).           Sexually active: Yes.    The current method of family planning is IUD--Mirena 07/20/22.    Menopausal hormone therapy:  na Exercising: No.   Smoker:  former  OB History  Gravida Para Term Preterm AB Living  2 1 1  1 1   SAB IAB Ectopic Multiple Live Births   1   1    # Outcome Date GA Lbr Len/2nd Weight Sex Type Anes PTL Lv  2 IAB 1988          1 Term 09/28/85 [redacted]w[redacted]d   M Vag-Spont   LIV     HEALTH MAINTENANCE: Last 2 paps:  06-17-21 ASCUS:Neg HR HPV, 06-05-18 Neg:Neg HR HPV,  05/19/17 Pap and HR HPV neg  History of abnormal Pap or positive HPV:  yes, hx of LSIL Mammogram:   11/03/23 Breast Density Cat B, BI-RADS CAT 1 neg Colonoscopy:  2014--[facility] Density:  n/a  Result  n/a   Immunization History  Administered Date(s) Administered   Influenza,inj,Quad PF,6+ Mos 09/23/2016   Influenza-Unspecified 09/19/2019   Moderna Sars-Covid-2 Vaccination 03/16/2020, 04/13/2020      reports that she quit smoking about 4 years ago. Her smoking use included cigarettes. She has never used smokeless tobacco. She reports that she does not drink alcohol and does not use drugs.  Past Medical History:  Diagnosis Date   Abnormal Pap smear of cervix    Anemia    Coronary artery disease    Essential hypertension    Family history of adverse reaction to anesthesia    mother- has proble,ms with N/v   GERD (gastroesophageal reflux disease)    History of colonic polyps     History of migraine    Occasional, menustral cycle related   Hx of chlamydia infection 1989   Hyperlipidemia    Polycythemia vera (HCC) 04/07/2023   PV (polycythemia vera) (HCC) 05/26/2022   STEMI (ST elevation myocardial infarction) (HCC)    10/08/19 PCI/DESx1 to pLAD    Past Surgical History:  Procedure Laterality Date   BREAST BIOPSY Right    BREAST SURGERY  01/2016   Rt.breast bx--?PASH   COLONOSCOPY WITH PROPOFOL N/A 11/28/2014   Procedure: COLONOSCOPY WITH PROPOFOL;  Surgeon: Florencia Reasons, MD;  Location: [facility];  Service: Endoscopy;  Laterality: N/A;   CORONARY/GRAFT ACUTE MI REVASCULARIZATION N/A 10/08/2019   Procedure: Coronary/Graft Acute MI Revascularization;  Surgeon: Kathleene Hazel, MD;  Location: [facility];  Service: Cardiovascular;  Laterality: N/A;   DILATATION & CURETTAGE/HYSTEROSCOPY WITH MYOSURE N/A 06/23/2022   Procedure: DILATATION & CURETTAGE/HYSTEROSCOPY WITH MYOSURE FIBROID RESECTION;  Surgeon: Patton Salles, MD;  Location: [facility] ORS;  Service: Gynecology;  Laterality: N/A;   Fibroid resection  01/2012   Dr. Tresa Res   HYSTEROSCOPY  01/2012   Dr. Tresa Res   LEFT HEART CATH AND CORONARY ANGIOGRAPHY N/A 10/08/2019   Procedure: LEFT HEART CATH AND  CORONARY ANGIOGRAPHY;  Surgeon: Kathleene Hazel, MD;  Location: [facility];  Service: Cardiovascular;  Laterality: N/A;   SKIN LESION EXCISION     --actually frozen by dermatology--benign   TOE SURGERY     Toe nail removed    Current Outpatient Medications  Medication Sig Dispense Refill   acetaminophen (TYLENOL) 500 MG tablet Take 500 mg by mouth every 6 (six) hours as needed.     atenolol (TENORMIN) 50 MG tablet Take 50 mg by mouth daily with lunch.      atorvastatin (LIPITOR) 80 MG tablet TAKE 1 TABLET BY MOUTH AT BEDTIME . APPOINTMENT REQUIRED FOR FUTURE REFILLS 90 tablet 3   BAYER ASPIRIN EC LOW DOSE 81 MG EC tablet Take 1 tablet by mouth daily.     cholecalciferol  (VITAMIN D) 1000 units tablet Take 2,000 Units by mouth daily.      Cyanocobalamin (VITAMIN B-12) 2500 MCG SUBL Take 2,500 mcg by mouth daily.     docusate sodium (COLACE) 100 MG capsule Take 100 mg by mouth daily as needed for mild constipation.     ketoconazole (NIZORAL) 2 % shampoo Apply 1 Application topically daily as needed for irritation.     Lactobacillus (PROBIOTIC ACIDOPHILUS PO) Take by mouth as needed.     levonorgestrel (MIRENA) 20 MCG/DAY IUD 1 each by Intrauterine route once.     nitroGLYCERIN (NITROSTAT) 0.4 MG SL tablet Place 1 tablet (0.4 mg total) under the tongue every 5 (five) minutes x 3 doses as needed for chest pain (if no relief after 3rd dose, proceed to ED or call 911). 25 tablet 3   omeprazole (PRILOSEC) 20 MG capsule Take 20 mg by mouth daily with lunch.      No current facility-administered medications for this visit.    ALLERGIES: Amoxicillin  Family History  Problem Relation Age of Onset   Cancer Mother        multiple myeloma   Hyperlipidemia Mother    Macular degeneration Mother    Cancer - Colon Father    Hypertension Father    Hyperlipidemia Father    Heart disease Father    Hypertension Brother    Heart disease Brother 77       Heart attack   Cancer - Colon Maternal Aunt    Cancer - Colon Maternal Uncle    Cancer - Colon Maternal Grandmother    Heart disease Paternal Grandfather     Review of Systems  All other systems reviewed and are negative.   PHYSICAL EXAM:  BP 132/84 (BP Location: Left Arm, Patient Position: Sitting, Cuff Size: Large)   Pulse 66   Ht 5' 4.5" (1.638 m)   Wt 228 lb (103.4 kg)   SpO2 98%   BMI 38.53 kg/m     General appearance: alert, cooperative and appears stated age Head: normocephalic, without obvious abnormality, atraumatic Neck: no adenopathy, supple, symmetrical, trachea midline and thyroid normal to inspection and palpation Lungs: clear to auscultation bilaterally Breasts: normal appearance, no masses or  tenderness, No nipple retraction or dimpling, No nipple discharge or bleeding, No axillary adenopathy Heart: regular rate and rhythm Abdomen: soft, non-tender; no masses, no organomegaly Extremities: extremities normal, atraumatic, no cyanosis or edema Skin: skin color, texture, turgor normal. No rashes or lesions Lymph nodes: cervical, supraclavicular, and axillary nodes normal. Neurologic: grossly normal  Pelvic: External genitalia:  patches of erythema of the vulva.              No  abnormal inguinal nodes palpated.              Urethra:  normal appearing urethra with no masses, tenderness or lesions              Bartholins and Skenes: normal                 Vagina: normal appearing vagina with normal color and discharge, no lesions              Cervix: no lesions.  IUD strings noted.               Pap taken: Yes.   Bimanual Exam:  Uterus:  normal size, contour, position, consistency, mobility, non-tender              Adnexa: no mass, fullness, tenderness              Rectal exam: Yes.  .  Confirms.              Anus:  normal sphincter tone, no lesions  Chaperone was present for exam:  Warren Lacy, CMA  ASSESSMENT: Well woman visit with gynecologic exam Mirena IUD. ASCUS pap and negative HR HPV. Has left vaginal apical polyp.  Biopsy benign.  Hx MI. Thrombocytosis.  Polycythemia.  Stress incontinence.  FH colon cancer.   PLAN: Mammogram screening discussed. Self breast awareness reviewed. Pap and HRV collected:  Yes.   Guidelines for Calcium, Vitamin D, regular exercise program including cardiovascular and weight bearing exercise. Medication refills:  NA. Declines referral back to pelvic floor therapy.  She will contact [facility] to schedule her colonoscopy.  Follow up:  yearly and prn.

## 2024-01-16 ENCOUNTER — Ambulatory Visit (INDEPENDENT_AMBULATORY_CARE_PROVIDER_SITE_OTHER): Payer: BC Managed Care – PPO | Admitting: Obstetrics and Gynecology

## 2024-01-16 ENCOUNTER — Other Ambulatory Visit (HOSPITAL_COMMUNITY)
Admission: RE | Admit: 2024-01-16 | Discharge: 2024-01-16 | Disposition: A | Discharge status: Home / Self Care | Payer: BC Managed Care – PPO | Source: Ambulatory Visit | Attending: Obstetrics and Gynecology | Admitting: Obstetrics and Gynecology

## 2024-01-16 ENCOUNTER — Encounter: Payer: Self-pay | Admitting: Obstetrics and Gynecology

## 2024-01-16 VITALS — BP 132/84 | HR 66 | Ht 64.5 in | Wt 228.0 lb

## 2024-01-16 DIAGNOSIS — Z01419 Encounter for gynecological examination (general) (routine) without abnormal findings: Secondary | ICD-10-CM | POA: Diagnosis not present

## 2024-01-16 DIAGNOSIS — Z124 Encounter for screening for malignant neoplasm of cervix: Secondary | ICD-10-CM | POA: Insufficient documentation

## 2024-01-16 NOTE — Patient Instructions (Signed)

## 2024-01-22 LAB — CYTOLOGY - PAP
Comment: NEGATIVE
High risk HPV: NEGATIVE

## 2024-01-24 ENCOUNTER — Encounter: Payer: Self-pay | Admitting: Obstetrics and Gynecology

## 2024-01-31 ENCOUNTER — Other Ambulatory Visit: Payer: Self-pay

## 2024-01-31 DIAGNOSIS — D45 Polycythemia vera: Secondary | ICD-10-CM

## 2024-01-31 DIAGNOSIS — D75839 Thrombocytosis, unspecified: Secondary | ICD-10-CM

## 2024-02-02 ENCOUNTER — Inpatient Hospital Stay: Payer: BC Managed Care – PPO | Attending: Hematology

## 2024-02-02 DIAGNOSIS — Z7982 Long term (current) use of aspirin: Secondary | ICD-10-CM | POA: Diagnosis not present

## 2024-02-02 DIAGNOSIS — Z87891 Personal history of nicotine dependence: Secondary | ICD-10-CM | POA: Insufficient documentation

## 2024-02-02 DIAGNOSIS — D45 Polycythemia vera: Secondary | ICD-10-CM | POA: Insufficient documentation

## 2024-02-02 DIAGNOSIS — R232 Flushing: Secondary | ICD-10-CM | POA: Insufficient documentation

## 2024-02-02 DIAGNOSIS — R519 Headache, unspecified: Secondary | ICD-10-CM | POA: Insufficient documentation

## 2024-02-02 DIAGNOSIS — Z800 Family history of malignant neoplasm of digestive organs: Secondary | ICD-10-CM | POA: Insufficient documentation

## 2024-02-02 DIAGNOSIS — R0602 Shortness of breath: Secondary | ICD-10-CM | POA: Insufficient documentation

## 2024-02-02 DIAGNOSIS — Z8049 Family history of malignant neoplasm of other genital organs: Secondary | ICD-10-CM | POA: Diagnosis not present

## 2024-02-02 DIAGNOSIS — D75839 Thrombocytosis, unspecified: Secondary | ICD-10-CM

## 2024-02-02 DIAGNOSIS — M7989 Other specified soft tissue disorders: Secondary | ICD-10-CM | POA: Diagnosis not present

## 2024-02-02 DIAGNOSIS — Z807 Family history of other malignant neoplasms of lymphoid, hematopoietic and related tissues: Secondary | ICD-10-CM | POA: Insufficient documentation

## 2024-02-02 LAB — CBC
HCT: 41.4 % (ref 36.0–46.0)
Hemoglobin: 12.4 g/dL (ref 12.0–15.0)
MCH: 22.4 pg — ABNORMAL LOW (ref 26.0–34.0)
MCHC: 30 g/dL (ref 30.0–36.0)
MCV: 74.9 fL — ABNORMAL LOW (ref 80.0–100.0)
Platelets: 818 10*3/uL — ABNORMAL HIGH (ref 150–400)
RBC: 5.53 MIL/uL — ABNORMAL HIGH (ref 3.87–5.11)
RDW: 16.6 % — ABNORMAL HIGH (ref 11.5–15.5)
WBC: 11.8 10*3/uL — ABNORMAL HIGH (ref 4.0–10.5)
nRBC: 0 % (ref 0.0–0.2)

## 2024-02-09 ENCOUNTER — Inpatient Hospital Stay: Payer: BC Managed Care – PPO

## 2024-02-09 ENCOUNTER — Inpatient Hospital Stay (HOSPITAL_BASED_OUTPATIENT_CLINIC_OR_DEPARTMENT_OTHER): Payer: BC Managed Care – PPO | Admitting: Oncology

## 2024-02-09 VITALS — BP 124/79 | HR 71 | Temp 98.5°F | Resp 18 | Ht 66.0 in | Wt 225.2 lb

## 2024-02-09 DIAGNOSIS — D75839 Thrombocytosis, unspecified: Secondary | ICD-10-CM

## 2024-02-09 DIAGNOSIS — D45 Polycythemia vera: Secondary | ICD-10-CM

## 2024-02-09 NOTE — Progress Notes (Signed)
[facility] [address], [place] [address]   CLINIC:  Medical Oncology/Hematology  PCP:  Farris Has, MD [address] [place] [address] [phone]   REASON FOR VISIT:  Follow-up for polycythemia vera  PRIOR THERAPY: None  CURRENT THERAPY: Aspirin 81 mg daily + therapeutic phlebotomy  INTERVAL HISTORY:   Carmen Edwards 58 y.o. female returns for routine follow-up of polycythemia vera.  She was last seen by me on 08/10/2023.  In the interim, denies any recent hospitalizations or surgeries.  Denies any changes to baseline health.  Reports an appetite is 85% and energy levels are 75%.   Patient has received intermittent therapeutic phlebotomies last on 12/15/23.  Has occasional dizziness and sweating following a phlebotomy.  Reports she sits for about 45 minutes after her infusion with resolution of her symptoms.  Reports this has not happened the last several phlebotomies.  Reports intermittent headaches, facial and hand swelling with redness prior to phlebotomies.  Denies any dizziness. She denies any symptoms of DVT or PE.  No aquagenic pruritus, Raynaud's phenomenon, or erythromelalgia. Denies tinnitus, blurry vision, strokelike symptoms, or neuropathy.   Reports chronic shortness of breath with exertion, hot flashes at bedtime 3-4 nights per week and new onset cramping in her shins.  Reports it almost feels like a leg cramp but it is in her bone.  No injury that she is aware of.  She continues to receive intermittent phlebotomies approximately every 2 months last was on 12/15/2023.  Reports she was seen a week prior to this and they were only able to remove 250 mL so she returned a week later to have the remaining amount removed.  ASSESSMENT & PLAN:  1.  JAK2 positive polycythemia vera: - Patient evaluated for thrombocytosis ranging between 421-741 since February 2013 - She is on baby aspirin since MI on 10/08/2019 - JAK2 V617F  test positive on 06/13/2022. - Risk stratification = Low risk (less than 80 years old, no history of DVT) - No indication for cytoreductive therapy at this time. Would consider cytoreductive therapy for uncontrolled PV associated symptoms, progressive increase of leukocytes and/or platelet counts, symptomatic or progressive splenomegaly, and poor tolerance of phlebotomy. - No aquagenic pruritus/vasomotor symptoms/thrombosis/B symptoms - She does not have any splenomegaly on examination. - Receives intermittent phlebotomies.  2.  Vaginal bleeding: - She reports irregular menses for the last 3 to 4 years, menstrual bleeding continuously for  40 days.  She had hysteroscopy and D&C on 07/05/2022. - She also developed microcytosis for the last year with low normal hemoglobin, indicating iron deficiency state. - She does not report any vaginal bleeding since IUD placed.  3.  Social/family history: - She worked Paramedic jobs.  No exposure to chemicals.  Smoked half pack per day and quit about 10 to 15 years ago. - Mother had multiple myeloma.  Maternal aunt and uncle had colon cancer.  Maternal grandmother had uterine cancer.  Maternal grandfather had prostate cancer.   PLAN: 1. Polycythemia vera (HCC) (Primary) -Most recent lab work from 02/02/2024 shows hemoglobin of 12.4, hematocrit 41.4 and a platelet count of 818. -Recommend phlebotomy (500 ml) for platelet count greater than 800.  -Proceed with phlebotomy today. -Return to clinic every 2 months with labs plus or minus a phlebotomy in 6 months with labs and see a provider. - Do not recommend treating iron deficiency, as this would further fuel her erythrocytosis/polycythemia vera - Continue aspirin 81 mg daily.  PLAN  SUMMARY: >> Phlebotomy today. >> Return to clinic every 2 months for labs +/- phlebotomy and in 6 months for labs, assessment +/- phlebotomy.     REVIEW OF SYSTEMS:   Review of Systems  Respiratory:  Positive for shortness of  breath.   Endocrine: Positive for hot flashes.  Musculoskeletal:  Positive for arthralgias (shin).  Neurological:  Positive for headaches and numbness.     PHYSICAL EXAM:  ECOG PERFORMANCE STATUS: 1 - Symptomatic but completely ambulatory  Vitals:   02/09/24 1307  BP: 124/79  Pulse: 71  Resp: 18  Temp: 98.5 F (36.9 C)  SpO2: 98%    Filed Weights   02/09/24 1307  Weight: 225 lb 3.2 oz (102.2 kg)    Physical Exam Constitutional:      Appearance: Normal appearance.  Cardiovascular:     Rate and Rhythm: Normal rate and regular rhythm.  Pulmonary:     Effort: Pulmonary effort is normal.     Breath sounds: Normal breath sounds.  Abdominal:     General: Bowel sounds are normal.     Palpations: Abdomen is soft.  Musculoskeletal:        General: No swelling. Normal range of motion.  Neurological:     Mental Status: She is alert and oriented to person, place, and time. Mental status is at baseline.     PAST MEDICAL/SURGICAL HISTORY:  Past Medical History:  Diagnosis Date   Abnormal Pap smear of cervix    Anemia    Coronary artery disease    Essential hypertension    Family history of adverse reaction to anesthesia    mother- has proble,ms with N/v   GERD (gastroesophageal reflux disease)    History of colonic polyps    History of migraine    Occasional, menustral cycle related   Hx of chlamydia infection 1989   Hyperlipidemia    Polycythemia vera (HCC) 04/07/2023   PV (polycythemia vera) (HCC) 05/26/2022   STEMI (ST elevation myocardial infarction) (HCC)    10/08/19 PCI/DESx1 to pLAD   Past Surgical History:  Procedure Laterality Date   BREAST BIOPSY Right    BREAST SURGERY  01/2016   Rt.breast bx--?PASH   COLONOSCOPY WITH PROPOFOL N/A 11/28/2014   Procedure: COLONOSCOPY WITH PROPOFOL;  Surgeon: Florencia Reasons, MD;  Location: [facility];  Service: Endoscopy;  Laterality: N/A;   CORONARY/GRAFT ACUTE MI REVASCULARIZATION N/A 10/08/2019   Procedure:  Coronary/Graft Acute MI Revascularization;  Surgeon: Kathleene Hazel, MD;  Location: [facility];  Service: Cardiovascular;  Laterality: N/A;   DILATATION & CURETTAGE/HYSTEROSCOPY WITH MYOSURE N/A 06/23/2022   Procedure: DILATATION & CURETTAGE/HYSTEROSCOPY WITH MYOSURE FIBROID RESECTION;  Surgeon: Patton Salles, MD;  Location: [facility];  Service: Gynecology;  Laterality: N/A;   Fibroid resection  01/2012   Dr. Tresa Res   HYSTEROSCOPY  01/2012   Dr. Tresa Res   LEFT HEART CATH AND CORONARY ANGIOGRAPHY N/A 10/08/2019   Procedure: LEFT HEART CATH AND CORONARY ANGIOGRAPHY;  Surgeon: Kathleene Hazel, MD;  Location: [facility];  Service: Cardiovascular;  Laterality: N/A;   SKIN LESION EXCISION     --actually frozen by dermatology--benign   TOE SURGERY     Toe nail removed    SOCIAL HISTORY:  Social History   Socioeconomic History   Marital status: Married    Spouse name: Not on file   Number of children: Not on file   Years of education: Not on file   Highest education level:  Not on file  Occupational History   Not on file  Tobacco Use   Smoking status: Former    Current packs/day: 0.00    Types: Cigarettes    Quit date: 10/08/2019    Years since quitting: 4.3   Smokeless tobacco: Never   Tobacco comments:    smokes 1/2 pack per year  Vaping Use   Vaping status: Never Used  Substance and Sexual Activity   Alcohol use: No    Alcohol/week: 0.0 standard drinks of alcohol   Drug use: No   Sexual activity: Yes    Partners: Male    Birth control/protection: Other-see comments, I.U.D.    Comment: withdrawal  Other Topics Concern   Not on file  Social History Narrative   Not on file   Social Drivers of Health   Financial Resource Strain: Not on file  Food Insecurity: Not on file  Transportation Needs: Not on file  Physical Activity: Not on file  Stress: Not on file  Social Connections: Not on file  Intimate Partner Violence: Not on file     FAMILY HISTORY:  Family History  Problem Relation Age of Onset   Cancer Mother        multiple myeloma   Hyperlipidemia Mother    Macular degeneration Mother    Cancer - Colon Father    Hypertension Father    Hyperlipidemia Father    Heart disease Father    Hypertension Brother    Heart disease Brother 62       Heart attack   Cancer - Colon Maternal Aunt    Cancer - Colon Maternal Uncle    Cancer - Colon Maternal Grandmother    Heart disease Paternal Grandfather     CURRENT MEDICATIONS:  Outpatient Encounter Medications as of 02/09/2024  Medication Sig Note   acetaminophen (TYLENOL) 500 MG tablet Take 500 mg by mouth every 6 (six) hours as needed.    atenolol (TENORMIN) 50 MG tablet Take 50 mg by mouth daily with lunch.     atorvastatin (LIPITOR) 80 MG tablet TAKE 1 TABLET BY MOUTH AT BEDTIME . APPOINTMENT REQUIRED FOR FUTURE REFILLS    BAYER ASPIRIN EC LOW DOSE 81 MG EC tablet Take 1 tablet by mouth daily. 06/20/2022: On hold due to surgery   cholecalciferol (VITAMIN D) 1000 units tablet Take 2,000 Units by mouth daily.     Cyanocobalamin (VITAMIN B-12) 2500 MCG SUBL Take 2,500 mcg by mouth daily.    docusate sodium (COLACE) 100 MG capsule Take 100 mg by mouth daily as needed for mild constipation.    ketoconazole (NIZORAL) 2 % shampoo Apply 1 Application topically daily as needed for irritation.    Lactobacillus (PROBIOTIC ACIDOPHILUS PO) Take by mouth as needed.    levonorgestrel (MIRENA) 20 MCG/DAY IUD 1 each by Intrauterine route once.    nitroGLYCERIN (NITROSTAT) 0.4 MG SL tablet Place 1 tablet (0.4 mg total) under the tongue every 5 (five) minutes x 3 doses as needed for chest pain (if no relief after 3rd dose, proceed to ED or call 911).    omeprazole (PRILOSEC) 20 MG capsule Take 20 mg by mouth daily with lunch.     No facility-administered encounter medications on file as of 02/09/2024.    ALLERGIES:  Allergies  Allergen Reactions   Amoxicillin Nausea And  Vomiting and Other (See Comments)    (high doses) Did it involve swelling of the face/tongue/throat, SOB, or low BP? No Did it involve sudden or severe rash/hives,  skin peeling, or any reaction on the inside of your mouth or nose? No Did you need to seek medical attention at a hospital or doctor's office? No When did it last happen? 2000      If all above answers are "NO", may proceed with cephalosporin use.     LABORATORY DATA:  I have reviewed the labs as listed.  CBC    Component Value Date/Time   WBC 11.8 (H) 02/02/2024 0904   RBC 5.53 (H) 02/02/2024 0904   HGB 12.4 02/02/2024 0904   HCT 41.4 02/02/2024 0904   PLT 818 (H) 02/02/2024 0904   MCV 74.9 (L) 02/02/2024 0904   MCH 22.4 (L) 02/02/2024 0904   MCHC 30.0 02/02/2024 0904   RDW 16.6 (H) 02/02/2024 0904   LYMPHSABS 3.2 10/13/2023 1201   MONOABS 0.8 10/13/2023 1201   EOSABS 0.3 10/13/2023 1201   BASOSABS 0.1 10/13/2023 1201      Latest Ref Rng & Units 06/22/2022    2:36 PM 10/10/2019    5:03 AM 10/09/2019    4:21 AM  CMP  Glucose 70 - 99 mg/dL 96  82  82   BUN 6 - 20 mg/dL 8  12  7    Creatinine 0.44 - 1.00 mg/dL 1.61  0.96  0.45   Sodium 135 - 145 mmol/L 142  137  136   Potassium 3.5 - 5.1 mmol/L 3.7  3.0  4.1   Chloride 98 - 111 mmol/L 114  106  108   CO2 22 - 32 mmol/L 23  21  19    Calcium 8.9 - 10.3 mg/dL 8.9  8.6  8.6   Total Protein 6.5 - 8.1 g/dL   6.1   Total Bilirubin 0.3 - 1.2 mg/dL   1.6   Alkaline Phos 38 - 126 U/L   75   AST 15 - 41 U/L   38   ALT 0 - 44 U/L   23     DIAGNOSTIC IMAGING:  I have independently reviewed the relevant imaging and discussed with the patient.   WRAP UP:  All questions were answered. The patient knows to call the clinic with any problems, questions or concerns.  Medical decision making: Moderate  Time spent on visit: I spent 25 minutes dedicated to the care of this patient (face-to-face and non-face-to-face) on the date of the encounter to include what is described  in the assessment and plan.  Mauro Kaufmann, NP  02/12/24 9:47 AM

## 2024-02-09 NOTE — Patient Instructions (Signed)

## 2024-02-09 NOTE — Progress Notes (Signed)
Labs reviewed with Carmen Pine NP. Per NP to proceed with phlebotomy . Pt agrees with plan.   Carmen Edwards presents today for phlebotomy per MD orders. Phlebotomy procedure started at 1401 and ended at 1407 500 cc removed. Patient tolerated procedure well. IV needle removed intact. Pt denied any drinks and snacks after phlebotomy. Pt only wanted to be observed for 15 minutes post phlebotomy. Pt observed for 15 minutes without any complications. Vitals stable. Pt stable for discharge. Pt left ambulatory to be driven home by son.   Carmen Edwards [employer]

## 2024-02-12 ENCOUNTER — Encounter: Payer: Self-pay | Admitting: Hematology

## 2024-04-05 ENCOUNTER — Inpatient Hospital Stay: Payer: BC Managed Care – PPO

## 2024-04-05 ENCOUNTER — Inpatient Hospital Stay: Payer: BC Managed Care – PPO | Attending: Hematology

## 2024-04-05 DIAGNOSIS — D75839 Thrombocytosis, unspecified: Secondary | ICD-10-CM

## 2024-04-05 DIAGNOSIS — D45 Polycythemia vera: Secondary | ICD-10-CM | POA: Diagnosis present

## 2024-04-05 LAB — CBC
HCT: 39.6 % (ref 36.0–46.0)
Hemoglobin: 11.3 g/dL — ABNORMAL LOW (ref 12.0–15.0)
MCH: 20.3 pg — ABNORMAL LOW (ref 26.0–34.0)
MCHC: 28.5 g/dL — ABNORMAL LOW (ref 30.0–36.0)
MCV: 71.2 fL — ABNORMAL LOW (ref 80.0–100.0)
Platelets: 770 10*3/uL — ABNORMAL HIGH (ref 150–400)
RBC: 5.56 MIL/uL — ABNORMAL HIGH (ref 3.87–5.11)
RDW: 18.1 % — ABNORMAL HIGH (ref 11.5–15.5)
WBC: 12.2 10*3/uL — ABNORMAL HIGH (ref 4.0–10.5)
nRBC: 0 % (ref 0.0–0.2)

## 2024-04-05 NOTE — Progress Notes (Signed)
 No phlebotomy today per order parameters.

## 2024-06-07 ENCOUNTER — Inpatient Hospital Stay: Payer: BC Managed Care – PPO

## 2024-06-07 ENCOUNTER — Inpatient Hospital Stay: Payer: BC Managed Care – PPO | Attending: Hematology

## 2024-06-07 DIAGNOSIS — D45 Polycythemia vera: Secondary | ICD-10-CM | POA: Diagnosis present

## 2024-06-07 DIAGNOSIS — D75839 Thrombocytosis, unspecified: Secondary | ICD-10-CM

## 2024-06-07 LAB — CBC
HCT: 39.1 % (ref 36.0–46.0)
Hemoglobin: 11.3 g/dL — ABNORMAL LOW (ref 12.0–15.0)
MCH: 19.9 pg — ABNORMAL LOW (ref 26.0–34.0)
MCHC: 28.9 g/dL — ABNORMAL LOW (ref 30.0–36.0)
MCV: 69 fL — ABNORMAL LOW (ref 80.0–100.0)
Platelets: 710 10*3/uL — ABNORMAL HIGH (ref 150–400)
RBC: 5.67 MIL/uL — ABNORMAL HIGH (ref 3.87–5.11)
RDW: 19.9 % — ABNORMAL HIGH (ref 11.5–15.5)
WBC: 9.4 10*3/uL (ref 4.0–10.5)
nRBC: 0 % (ref 0.0–0.2)

## 2024-06-07 NOTE — Progress Notes (Signed)
 Platelets today are 710.  Hold therapeutic phlebotomy per provider orders.

## 2024-08-07 ENCOUNTER — Other Ambulatory Visit: Payer: Self-pay | Admitting: *Deleted

## 2024-08-09 ENCOUNTER — Inpatient Hospital Stay: Payer: BC Managed Care – PPO | Admitting: Oncology

## 2024-08-09 ENCOUNTER — Inpatient Hospital Stay: Payer: BC Managed Care – PPO

## 2024-08-09 ENCOUNTER — Inpatient Hospital Stay: Payer: BC Managed Care – PPO | Attending: Hematology | Admitting: Oncology

## 2024-08-09 VITALS — BP 138/87 | HR 64 | Temp 98.1°F | Resp 18 | Wt 213.4 lb

## 2024-08-09 DIAGNOSIS — D45 Polycythemia vera: Secondary | ICD-10-CM | POA: Diagnosis not present

## 2024-08-09 DIAGNOSIS — R252 Cramp and spasm: Secondary | ICD-10-CM | POA: Diagnosis not present

## 2024-08-09 DIAGNOSIS — D75839 Thrombocytosis, unspecified: Secondary | ICD-10-CM

## 2024-08-09 DIAGNOSIS — Z7982 Long term (current) use of aspirin: Secondary | ICD-10-CM | POA: Insufficient documentation

## 2024-08-09 LAB — CBC
HCT: 40 % (ref 36.0–46.0)
Hemoglobin: 11.4 g/dL — ABNORMAL LOW (ref 12.0–15.0)
MCH: 19.8 pg — ABNORMAL LOW (ref 26.0–34.0)
MCHC: 28.5 g/dL — ABNORMAL LOW (ref 30.0–36.0)
MCV: 69.6 fL — ABNORMAL LOW (ref 80.0–100.0)
Platelets: 749 K/uL — ABNORMAL HIGH (ref 150–400)
RBC: 5.75 MIL/uL — ABNORMAL HIGH (ref 3.87–5.11)
RDW: 20 % — ABNORMAL HIGH (ref 11.5–15.5)
WBC: 10.4 K/uL (ref 4.0–10.5)
nRBC: 0 % (ref 0.0–0.2)

## 2024-08-09 NOTE — Progress Notes (Signed)
 [facility] OFFICE PROGRESS NOTE  Kip Righter, MD  ASSESSMENT & PLAN:  Assessment & Plan Polycythemia vera (HCC) -Most recent lab work from 08/09/2024 shows a hemoglobin of 11.4, hematocrit 40.0 and platelet count of 749. -We discussed phlebotomy for platelet count greater than 800 (500 mL).  -Return to clinic every 2 months with labs plus or minus a phlebotomy in 6 months with labs and see a provider. - Do not recommend treating iron deficiency, as this would further fuel her erythrocytosis/polycythemia vera - Continue aspirin 81 mg daily. Leg cramping Mainly in the left leg.  There is no pain in either calf.  This happens intermittently.  We discussed imaging should this be persistent and become painful.  Discussed could be electrolyte abnormalities given mild sweating while doing yard work.  Recommend drinking liquids with electrolytes such as Gatorade or propel.   Orders Placed This Encounter  Procedures   CBC    Standing Status:   Standing    Number of Occurrences:   5    Next Expected Occurrence:   10/04/2024    Expiration Date:   08/11/2025    INTERVAL HISTORY: Patient returns for follow-up for JAK2 positive polycythemia.  Receives intermittent phlebotomies last  on 02/09/2024.  Tolerates phlebotomies well.  In the interim, denies any hospitalizations, surgeries or changes to baseline health.  Reports leg and calf cramping over the past few months.  She is uncertain if it is related to her polycythemia or if it is something else.  Reports she has been doing a lot of yard work and sweating quite a bit.  She also works 12-hour shifts and is very active.  Reports occasional headaches at times and fatigue.  She will sometimes take a nap after work.  Has a breath with exertion.  Reports night sweats not every night.  Reports she is scheduled for colonoscopy later this month.  Reviewed CBC today.  SUMMARY OF HEMATOLOGIC HISTORY: - Patient evaluated for  thrombocytosis ranging between 421-741 since February 2013 - She is on baby aspirin since MI on 10/08/2019 - JAK2 V617F test positive on 06/13/2022. - Risk stratification = Low risk (less than 53 years old, no history of DVT) - No indication for cytoreductive therapy at this time. Would consider cytoreductive therapy for uncontrolled PV associated symptoms, progressive increase of leukocytes and/or platelet counts, symptomatic or progressive splenomegaly, and poor tolerance of phlebotomy. - No aquagenic pruritus/vasomotor symptoms/thrombosis/B symptoms - She does not have any splenomegaly on examination. - Receives intermittent phlebotomies. No results found for: CBC  Vitals:   08/09/24 1304  BP: 138/87  Pulse: 64  Resp: 18  Temp: 98.1 F (36.7 C)  SpO2: 99%    Review of Systems  Constitutional:  Positive for malaise/fatigue. Negative for weight loss.  Respiratory:  Positive for shortness of breath.   Gastrointestinal:  Positive for constipation.  Neurological:  Positive for tingling, sensory change and headaches.  Psychiatric/Behavioral:  Negative for depression. The patient is not nervous/anxious.     Physical Exam Constitutional:      Appearance: Normal appearance. She is obese.  HENT:     Head: Normocephalic and atraumatic.  Eyes:     Pupils: Pupils are equal, round, and reactive to light.  Cardiovascular:     Rate and Rhythm: Normal rate and regular rhythm.     Heart sounds: Normal heart sounds. No murmur heard. Pulmonary:     Effort: Pulmonary effort is normal.     Breath sounds: Normal  breath sounds. No wheezing.  Abdominal:     General: Bowel sounds are normal. There is no distension.     Palpations: Abdomen is soft.     Tenderness: There is no abdominal tenderness.  Musculoskeletal:        General: Normal range of motion.     Cervical back: Normal range of motion.  Skin:    General: Skin is warm and dry.     Findings: No rash.  Neurological:     Mental  Status: She is alert and oriented to person, place, and time.     Gait: Gait is intact.  Psychiatric:        Mood and Affect: Mood and affect normal.        Cognition and Memory: Memory normal.        Judgment: Judgment normal.     I spent 25 minutes dedicated to the care of this patient (face-to-face and non-face-to-face) on the date of the encounter to include what is described in the assessment and plan.,  Delon Hope, NP 08/11/2024 9:16 AM

## 2024-08-09 NOTE — Assessment & Plan Note (Addendum)
-  Most recent lab work from 08/09/2024 shows a hemoglobin of 11.4, hematocrit 40.0 and platelet count of 749. -We discussed phlebotomy for platelet count greater than 800 (500 mL).  -Return to clinic every 2 months with labs plus or minus a phlebotomy in 6 months with labs and see a provider. - Do not recommend treating iron deficiency, as this would further fuel her erythrocytosis/polycythemia vera - Continue aspirin  81 mg daily.

## 2024-08-09 NOTE — Progress Notes (Signed)
 No phlebotomy today due to platelets <800

## 2024-10-11 ENCOUNTER — Inpatient Hospital Stay

## 2024-10-11 ENCOUNTER — Inpatient Hospital Stay: Attending: Hematology

## 2024-10-11 DIAGNOSIS — D45 Polycythemia vera: Secondary | ICD-10-CM | POA: Insufficient documentation

## 2024-10-11 LAB — CBC
HCT: 39 % (ref 36.0–46.0)
Hemoglobin: 11.1 g/dL — ABNORMAL LOW (ref 12.0–15.0)
MCH: 19.9 pg — ABNORMAL LOW (ref 26.0–34.0)
MCHC: 28.5 g/dL — ABNORMAL LOW (ref 30.0–36.0)
MCV: 69.8 fL — ABNORMAL LOW (ref 80.0–100.0)
Platelets: 722 K/uL — ABNORMAL HIGH (ref 150–400)
RBC: 5.59 MIL/uL — ABNORMAL HIGH (ref 3.87–5.11)
RDW: 19.7 % — ABNORMAL HIGH (ref 11.5–15.5)
WBC: 11.9 K/uL — ABNORMAL HIGH (ref 4.0–10.5)
nRBC: 0 % (ref 0.0–0.2)

## 2024-10-11 NOTE — Progress Notes (Signed)
 Platelets 722. Parameters for phlebotomy : give phlebotomy if platelets > than 800 per Carmen Hope NP.

## 2024-10-27 ENCOUNTER — Other Ambulatory Visit: Payer: Self-pay | Admitting: Cardiology

## 2024-11-08 ENCOUNTER — Ambulatory Visit: Attending: Cardiology | Admitting: Cardiology

## 2024-11-08 ENCOUNTER — Encounter: Payer: Self-pay | Admitting: Cardiology

## 2024-11-08 VITALS — BP 124/84 | HR 64 | Ht 65.0 in | Wt 213.4 lb

## 2024-11-08 DIAGNOSIS — E782 Mixed hyperlipidemia: Secondary | ICD-10-CM

## 2024-11-08 DIAGNOSIS — I10 Essential (primary) hypertension: Secondary | ICD-10-CM

## 2024-11-08 DIAGNOSIS — I25119 Atherosclerotic heart disease of native coronary artery with unspecified angina pectoris: Secondary | ICD-10-CM

## 2024-11-08 NOTE — Progress Notes (Signed)
    Cardiology Office Note  Date: 11/08/2024   ID: Carmen Edwards, Carmen Edwards Mar 08, 1966, MRN [id number]  History of Present Illness: Carmen Edwards is a 58 y.o. female last seen in August 2024.  She is here for a routine visit.  Since last encounter she does not report any angina or interval nitroglycerin  use, no specific change in stamina.  No increasing dyspnea on exertion or syncope.  We went over her medications which are stable from a cardiac perspective.  She has not had a recent lipid panel, we discussed getting this ordered.  She continues on Lipitor  80 mg daily.  I reviewed her ECG today which shows normal sinus rhythm.  Physical Exam: VS:  BP 124/84 (BP Location: Left Arm, Cuff Size: Large)   Pulse 64   Ht 5' 5 (1.651 m)   Wt 213 lb 6.4 oz (96.8 kg)   SpO2 97%   BMI 35.51 kg/m , BMI Body mass index is 35.51 kg/m.  Wt Readings from Last 3 Encounters:  11/08/24 213 lb 6.4 oz (96.8 kg)  08/09/24 213 lb 6.5 oz (96.8 kg)  02/09/24 225 lb 3.2 oz (102.2 kg)    General: Patient appears comfortable at rest. HEENT: Conjunctiva and lids normal. Neck: Supple, no elevated JVP or carotid bruits. Lungs: Clear to auscultation, nonlabored breathing at rest. Cardiac: Regular rate and rhythm, no S3 or significant systolic murmur. Extremities: No pitting edema.  ECG:  An ECG dated 08/25/2023 was personally reviewed today and demonstrated:  Sinus rhythm.  Labwork: 10/11/2024: Hemoglobin 11.1; Platelets 722   Other Studies Reviewed Today:  No interval cardiac testing for review today.  Assessment and Plan:  1.  CAD status post anterior STEMI with DES to the proximal LAD in October 2020.  LVEF 60 to 65% at that time.  She continues to do well without angina or interval nitroglycerin  use.  ECG is normal today.  Continue aspirin  81 mg daily, Lipitor  80 mg daily, and as needed nitroglycerin .   2.  Primary hypertension.  No change in current regimen, continue  atenolol  50 mg daily.   3.  Mixed hyperlipidemia.  Continue Lipitor  80 mg daily.  Check FLP.  Disposition:  Follow up 1 year.  Signed, Jayson JUDITHANN Sierras, M.D., F.A.C.C. [facility]

## 2024-11-08 NOTE — Patient Instructions (Signed)
 Medication Instructions:   Your physician recommends that you continue on your current medications as directed. Please refer to the Current Medication list given to you today.   Labwork: Lipids  Testing/Procedures: None today  Follow-Up: 1 year  Any Other Special Instructions Will Be Listed Below (If Applicable).  If you need a refill on your cardiac medications before your next appointment, please call your pharmacy.

## 2024-11-18 ENCOUNTER — Ambulatory Visit: Payer: Self-pay | Admitting: Cardiology

## 2024-11-18 ENCOUNTER — Other Ambulatory Visit (HOSPITAL_COMMUNITY)
Admission: RE | Admit: 2024-11-18 | Discharge: 2024-11-18 | Disposition: A | Discharge status: Home / Self Care | Source: Ambulatory Visit | Attending: Cardiology | Admitting: Cardiology

## 2024-11-18 DIAGNOSIS — I10 Essential (primary) hypertension: Secondary | ICD-10-CM | POA: Diagnosis present

## 2024-11-18 LAB — LIPID PANEL
Cholesterol: 131 mg/dL (ref 0–200)
HDL: 51 mg/dL (ref >40)
LDL Cholesterol: 54 mg/dL (ref 0–99)
Total CHOL/HDL Ratio: 2.6 ratio
Triglycerides: 129 mg/dL (ref <150)
VLDL: 26 mg/dL (ref 0–40)

## 2024-11-25 ENCOUNTER — Other Ambulatory Visit: Payer: Self-pay | Admitting: Obstetrics and Gynecology

## 2024-11-25 DIAGNOSIS — Z1231 Encounter for screening mammogram for malignant neoplasm of breast: Secondary | ICD-10-CM

## 2024-12-02 ENCOUNTER — Encounter: Payer: Self-pay | Admitting: Obstetrics and Gynecology

## 2024-12-02 ENCOUNTER — Ambulatory Visit: Admitting: Obstetrics and Gynecology

## 2024-12-02 VITALS — BP 122/60 | HR 75 | Temp 98.2°F | Wt 214.0 lb

## 2024-12-02 DIAGNOSIS — N898 Other specified noninflammatory disorders of vagina: Secondary | ICD-10-CM | POA: Diagnosis not present

## 2024-12-02 LAB — WET PREP FOR TRICH, YEAST, CLUE

## 2024-12-02 NOTE — Progress Notes (Deleted)
 58 y.o. G39P1011 female here for ***. Married.  No LMP recorded. (Menstrual status: IUD).    She reports {symptoms; vaginitis:19577}. Symptoms have been present for ***. She has tried ***. Urine sample provided: ***  Birth control: *** Sexually active: ***    GYN HISTORY: ***  OB History  Gravida Para Term Preterm AB Living  2 1 1  1 1   SAB IAB Ectopic Multiple Live Births   1   1    # Outcome Date GA Lbr Len/2nd Weight Sex Type Anes PTL Lv  2 IAB 1988          1 Term 09/28/85 [redacted]w[redacted]d   M Vag-Spont   LIV   Past Medical History:  Diagnosis Date   Abnormal Pap smear of cervix    Anemia    Coronary artery disease    Essential hypertension    Family history of adverse reaction to anesthesia    mother- has proble,ms with N/v   GERD (gastroesophageal reflux disease)    History of colonic polyps    History of migraine    Occasional, menustral cycle related   Hx of chlamydia infection 1989   Hyperlipidemia    Polycythemia vera (HCC) 04/07/2023   PV (polycythemia vera) (HCC) 05/26/2022   STEMI (ST elevation myocardial infarction) (HCC)    10/08/19 PCI/DESx1 to pLAD   Past Surgical History:  Procedure Laterality Date   BREAST BIOPSY Right    BREAST SURGERY  01/2016   Rt.breast bx--?PASH   COLONOSCOPY WITH PROPOFOL  N/A 11/28/2014   Procedure: COLONOSCOPY WITH PROPOFOL ;  Surgeon: Lamar Donnald GAILS, MD;  Location: [facility];  Service: Endoscopy;  Laterality: N/A;   CORONARY/GRAFT ACUTE MI REVASCULARIZATION N/A 10/08/2019   Procedure: Coronary/Graft Acute MI Revascularization;  Surgeon: Verlin Lonni BIRCH, MD;  Location: [facility];  Service: Cardiovascular;  Laterality: N/A;   DILATATION & CURETTAGE/HYSTEROSCOPY WITH MYOSURE N/A 06/23/2022   Procedure: DILATATION & CURETTAGE/HYSTEROSCOPY WITH MYOSURE FIBROID RESECTION;  Surgeon: Cathlyn JAYSON Nikki Bobie FORBES, MD;  Location: [facility];  Service: Gynecology;  Laterality: N/A;   Fibroid resection  01/2012   Dr. Mandy    HYSTEROSCOPY  01/2012   Dr. Mandy   LEFT HEART CATH AND CORONARY ANGIOGRAPHY N/A 10/08/2019   Procedure: LEFT HEART CATH AND CORONARY ANGIOGRAPHY;  Surgeon: Verlin Lonni BIRCH, MD;  Location: [facility];  Service: Cardiovascular;  Laterality: N/A;   SKIN LESION EXCISION     --actually frozen by dermatology--benign   TOE SURGERY     Toe nail removed   Current Outpatient Medications on File Prior to Visit  Medication Sig Dispense Refill   acetaminophen  (TYLENOL ) 500 MG tablet Take 500 mg by mouth every 6 (six) hours as needed.     atenolol  (TENORMIN ) 50 MG tablet Take 50 mg by mouth daily with lunch.      atorvastatin  (LIPITOR ) 80 MG tablet TAKE 1 TABLET BY MOUTH AT BEDTIME 30 tablet 0   BAYER ASPIRIN  EC LOW DOSE 81 MG EC tablet Take 1 tablet by mouth daily.     cholecalciferol (VITAMIN D) 1000 units tablet Take 2,000 Units by mouth daily.      Cyanocobalamin (VITAMIN B-12) 2500 MCG SUBL Take 2,500 mcg by mouth daily.     docusate sodium (COLACE) 100 MG capsule Take 100 mg by mouth daily as needed for mild constipation.     ketoconazole (NIZORAL) 2 % shampoo Apply 1 Application topically daily as needed for irritation.  Lactobacillus (PROBIOTIC ACIDOPHILUS PO) Take by mouth as needed.     levonorgestrel  (MIRENA ) 20 MCG/DAY IUD 1 each by Intrauterine route once.     nitroGLYCERIN  (NITROSTAT ) 0.4 MG SL tablet Place 1 tablet (0.4 mg total) under the tongue every 5 (five) minutes x 3 doses as needed for chest pain (if no relief after 3rd dose, proceed to ED or call 911). 25 tablet 3   omeprazole (PRILOSEC) 20 MG capsule Take 20 mg by mouth daily with lunch.      No current facility-administered medications on file prior to visit.   Allergies  Allergen Reactions   Amoxicillin Nausea And Vomiting and Other (See Comments)    (high doses) Did it involve swelling of the face/tongue/throat, SOB, or low BP? No Did it involve sudden or severe rash/hives, skin peeling, or any  reaction on the inside of your mouth or nose? No Did you need to seek medical attention at a hospital or doctor's office? No When did it last happen? 2000      If all above answers are "NO", may proceed with cephalosporin use.       PE There were no vitals filed for this visit. There is no height or weight on file to calculate BMI.  Physical Exam    Assessment and Plan:        There are no diagnoses linked to this encounter.  Clotilda FORBES Pa, CMA

## 2024-12-02 NOTE — Progress Notes (Signed)
 58 y.o. G28P1011 female with Mirena  IUD here for problem visit. Married.  No LMP recorded. (Menstrual status: IUD).   She reports vaginal itching that comes and goes, rash on inside of labia, used vagasil on labia but not going away. .  Sexually active: yes, notes itching after intercourse   OB History  Gravida Para Term Preterm AB Living  2 1 1  1 1   SAB IAB Ectopic Multiple Live Births   1   1    # Outcome Date GA Lbr Len/2nd Weight Sex Type Anes PTL Lv  2 IAB 1988          1 Term 09/28/85 [redacted]w[redacted]d   M Vag-Spont   LIV   Past Medical History:  Diagnosis Date   Abnormal Pap smear of cervix    Anemia    Coronary artery disease    Essential hypertension    Family history of adverse reaction to anesthesia    mother- has proble,ms with N/v   GERD (gastroesophageal reflux disease)    History of colonic polyps    History of migraine    Occasional, menustral cycle related   Hx of chlamydia infection 1989   Hyperlipidemia    Polycythemia vera (HCC) 04/07/2023   PV (polycythemia vera) (HCC) 05/26/2022   STEMI (ST elevation myocardial infarction) (HCC)    10/08/19 PCI/DESx1 to pLAD   Past Surgical History:  Procedure Laterality Date   BREAST BIOPSY Right    BREAST SURGERY  01/2016   Rt.breast bx--?PASH   COLONOSCOPY WITH PROPOFOL  N/A 11/28/2014   Procedure: COLONOSCOPY WITH PROPOFOL ;  Surgeon: Lamar Donnald GAILS, MD;  Location: [facility];  Service: Endoscopy;  Laterality: N/A;   CORONARY/GRAFT ACUTE MI REVASCULARIZATION N/A 10/08/2019   Procedure: Coronary/Graft Acute MI Revascularization;  Surgeon: Verlin Lonni BIRCH, MD;  [facility];  Service: Cardiovascular;  Laterality: N/A;   DILATATION & CURETTAGE/HYSTEROSCOPY WITH MYOSURE N/A 06/23/2022   Procedure: DILATATION & CURETTAGE/HYSTEROSCOPY WITH MYOSURE FIBROID RESECTION;  Surgeon: Cathlyn JAYSON Nikki Bobie FORBES, MD;  Location: [facility];  Service: Gynecology;  Laterality: N/A;   Fibroid resection  01/2012   Dr.  Mandy   HYSTEROSCOPY  01/2012   Dr. Mandy   LEFT HEART CATH AND CORONARY ANGIOGRAPHY N/A 10/08/2019   Procedure: LEFT HEART CATH AND CORONARY ANGIOGRAPHY;  Surgeon: Verlin Lonni BIRCH, MD;  [facility];  Service: Cardiovascular;  Laterality: N/A;   SKIN LESION EXCISION     --actually frozen by dermatology--benign   TOE SURGERY     Toe nail removed   Current Outpatient Medications on File Prior to Visit  Medication Sig Dispense Refill   acetaminophen  (TYLENOL ) 500 MG tablet Take 500 mg by mouth every 6 (six) hours as needed.     atenolol  (TENORMIN ) 50 MG tablet Take 50 mg by mouth daily with lunch.      atorvastatin  (LIPITOR ) 80 MG tablet TAKE 1 TABLET BY MOUTH AT BEDTIME 30 tablet 0   BAYER ASPIRIN  EC LOW DOSE 81 MG EC tablet Take 1 tablet by mouth daily.     cholecalciferol (VITAMIN D) 1000 units tablet Take 2,000 Units by mouth daily.      Cyanocobalamin (VITAMIN B-12) 2500 MCG SUBL Take 2,500 mcg by mouth daily.     docusate sodium (COLACE) 100 MG capsule Take 100 mg by mouth daily as needed for mild constipation.     ketoconazole (NIZORAL) 2 % shampoo Apply 1 Application topically daily as needed for irritation.  Lactobacillus (PROBIOTIC ACIDOPHILUS PO) Take by mouth as needed.     levonorgestrel  (MIRENA ) 20 MCG/DAY IUD 1 each by Intrauterine route once.     nitroGLYCERIN  (NITROSTAT ) 0.4 MG SL tablet Place 1 tablet (0.4 mg total) under the tongue every 5 (five) minutes x 3 doses as needed for chest pain (if no relief after 3rd dose, proceed to ED or call 911). 25 tablet 3   omeprazole (PRILOSEC) 20 MG capsule Take 20 mg by mouth daily with lunch.      No current facility-administered medications on file prior to visit.   Allergies  Allergen Reactions   Amoxicillin Nausea And Vomiting and Other (See Comments)    (high doses) Did it involve swelling of the face/tongue/throat, SOB, or low BP? No Did it involve sudden or severe rash/hives, skin peeling, or  any reaction on the inside of your mouth or nose? No Did you need to seek medical attention at a hospital or doctor's office? No When did it last happen? 2000      If all above answers are "NO", may proceed with cephalosporin use.       PE Today's Vitals   12/02/24 1459  BP: 122/60  Pulse: 75  Temp: 98.2 F (36.8 C)  TempSrc: Oral  SpO2: 98%  Weight: 214 lb (97.1 kg)   Body mass index is 35.61 kg/m.  Physical Exam Vitals reviewed. Exam conducted with a chaperone present.  Constitutional:      General: She is not in acute distress.    Appearance: Normal appearance.  HENT:     Head: Normocephalic and atraumatic.     Nose: Nose normal.  Eyes:     Extraocular Movements: Extraocular movements intact.     Conjunctiva/sclera: Conjunctivae normal.  Pulmonary:     Effort: Pulmonary effort is normal.  Genitourinary:    Exam position: Lithotomy position.     Vagina: Normal. No vaginal discharge.     Cervix: Normal. No cervical motion tenderness, discharge or lesion.     Uterus: Normal. Not enlarged and not tender.      Adnexa: Right adnexa normal and left adnexa normal.      Comments: Mild folliculitis present Erythematous plaque Vulvovaginal atrophy noted Musculoskeletal:        General: Normal range of motion.     Cervical back: Normal range of motion.  Neurological:     General: No focal deficit present.     Mental Status: She is alert.  Psychiatric:        Mood and Affect: Mood normal.        Behavior: Behavior normal.       Assessment and Plan:        Vaginal irritation -     WET PREP FOR TRICH, YEAST, CLUE  Wet prep negative Suspect rash is a contact dermatitis from pad and urinary incontinence Recommend 1% hydrocortisone cream BID x7d followed by daily application of vaseline as barrier Consider vaginal estrogen if sx unimproved with vaseline Will discuss with Dr. Nikki at annual exam  Vera LULLA Pa, MD

## 2024-12-07 ENCOUNTER — Other Ambulatory Visit: Payer: Self-pay | Admitting: Cardiology

## 2024-12-13 ENCOUNTER — Inpatient Hospital Stay: Attending: Hematology

## 2024-12-13 ENCOUNTER — Inpatient Hospital Stay

## 2024-12-13 DIAGNOSIS — D45 Polycythemia vera: Secondary | ICD-10-CM | POA: Diagnosis present

## 2024-12-13 DIAGNOSIS — D75839 Thrombocytosis, unspecified: Secondary | ICD-10-CM

## 2024-12-13 LAB — CBC
HCT: 38.4 % (ref 36.0–46.0)
Hemoglobin: 11 g/dL — ABNORMAL LOW (ref 12.0–15.0)
MCH: 19.9 pg — ABNORMAL LOW (ref 26.0–34.0)
MCHC: 28.6 g/dL — ABNORMAL LOW (ref 30.0–36.0)
MCV: 69.4 fL — ABNORMAL LOW (ref 80.0–100.0)
Platelets: 709 K/uL — ABNORMAL HIGH (ref 150–400)
RBC: 5.53 MIL/uL — ABNORMAL HIGH (ref 3.87–5.11)
RDW: 19.2 % — ABNORMAL HIGH (ref 11.5–15.5)
WBC: 11.1 K/uL — ABNORMAL HIGH (ref 4.0–10.5)
nRBC: 0 % (ref 0.0–0.2)

## 2024-12-13 NOTE — Progress Notes (Signed)
 Platelets are 709 today, no phlebotomy needed pre providers notes.

## 2024-12-20 ENCOUNTER — Ambulatory Visit
Admission: RE | Admit: 2024-12-20 | Discharge: 2024-12-20 | Disposition: A | Discharge status: Home / Self Care | Source: Ambulatory Visit | Attending: Obstetrics and Gynecology | Admitting: Obstetrics and Gynecology

## 2024-12-20 DIAGNOSIS — Z1231 Encounter for screening mammogram for malignant neoplasm of breast: Secondary | ICD-10-CM

## 2024-12-23 ENCOUNTER — Encounter: Payer: Self-pay | Admitting: *Deleted

## 2024-12-29 ENCOUNTER — Ambulatory Visit: Payer: Self-pay | Admitting: Obstetrics and Gynecology

## 2024-12-30 ENCOUNTER — Ambulatory Visit: Admitting: Obstetrics and Gynecology

## 2025-01-09 ENCOUNTER — Inpatient Hospital Stay: Attending: Hematology | Admitting: Oncology

## 2025-01-09 DIAGNOSIS — D45 Polycythemia vera: Secondary | ICD-10-CM | POA: Diagnosis not present

## 2025-01-09 DIAGNOSIS — D508 Other iron deficiency anemias: Secondary | ICD-10-CM | POA: Diagnosis not present

## 2025-01-09 NOTE — Assessment & Plan Note (Addendum)
-  Most recent lab work from 12/13/2024 shows platelet count of 709, hemoglobin 11.0 with an elevated white count of 11.1. -We discussed phlebotomy for platelet count greater than 800 (500 mL).  She has not needed a phlebotomy since 02/09/2024. -Return to clinic every 2 months with labs plus or minus a phlebotomy in 6 months with labs and see a provider. - Do not recommend treating iron deficiency, as this would further fuel her erythrocytosis/polycythemia vera - Continue aspirin  81 mg daily. -Overall, she is not feeling poorly or even having fatigue given she is anemic. -We did discuss the use of hydroxyurea in the future should she need it.  Education sent to her MyChart account.

## 2025-01-09 NOTE — Progress Notes (Signed)
 "  [facility] OFFICE PROGRESS NOTE  Kip Righter, MD  ASSESSMENT & PLAN:   I connected with Carmen Edwards on 01/09/25 at 10:45 AM EST by telephone visit and verified that I am speaking with the correct person using two identifiers.   I discussed the limitations, risks, security and privacy concerns of performing an evaluation and management service by telemedicine and the availability of in-person appointments. I also discussed with the patient that there may be a patient responsible charge related to this service. The patient expressed understanding and agreed to proceed.   Other persons participating in the visit and their role in the encounter: Carmen Edwards, Patient    Patients location: Home  Providers location: Clinic   Assessment & Plan Polycythemia vera (HCC) -Most recent lab work from 12/13/2024 shows platelet count of 709, hemoglobin 11.0 with an elevated white count of 11.1. -We discussed phlebotomy for platelet count greater than 800 (500 mL).  She has not needed a phlebotomy since 02/09/2024. -Return to clinic every 2 months with labs plus or minus a phlebotomy in 6 months with labs and see a provider. - Do not recommend treating iron deficiency, as this would further fuel her erythrocytosis/polycythemia vera - Continue aspirin  81 mg daily. -Overall, she is not feeling poorly or even having fatigue given she is anemic. -We did discuss the use of hydroxyurea in the future should she need it.  Education sent to her MyChart account. Other iron deficiency anemia -Secondary to frequent phlebotomies. -Hemoglobin continues to trend down. -Recheck iron levels at next visit.   Orders Placed This Encounter  Procedures   Lactate dehydrogenase    Standing Status:   Future    Expected Date:   02/14/2025    Expiration Date:   05/15/2025    Release to patient:   Immediate   Iron and TIBC    Standing Status:   Future    Expected Date:   02/14/2025    Expiration  Date:   05/15/2025    Release to patient:   Immediate   Ferritin    Standing Status:   Future    Expected Date:   02/14/2025    Expiration Date:   05/15/2025    Release to patient:   Immediate   CBC with Differential/Platelet    Standing Status:   Future    Expected Date:   02/14/2025    Expiration Date:   05/15/2025    Release to patient:   Immediate   CBC with Differential    Standing Status:   Standing    Number of Occurrences:   5    Expiration Date:   01/09/2026    INTERVAL HISTORY: Patient returns for follow-up for JAK2 positive polycythemia.  Receives intermittent phlebotomies last  on 02/09/2024.  Tolerates phlebotomies well.  In the interim, denies any hospitalizations, surgeries or changes to baseline health.  Reports overall she has been doing well.  Has cramping in her lower extremities occasionally. She is uncertain if it is related to her polycythemia or if it is something else.  She also works 12-hour shifts and is very active.  Reports occasional headaches at times and fatigue.  She will sometimes take a nap after work.  Has occasional shortness of breath with exertion.  Reports night sweats not every night.  Her visit with us  she was evaluated by cardiology on 11/08/2024 for CAD, hypertension and mixed hyperlipoidemia.  Stable on Lipitor , atenolol  and aspirin .  Evaluated by OB/GYN on  12/02/2024 for follow-up for Mirena  IUD.  Reviewed CBC today.  SUMMARY OF HEMATOLOGIC HISTORY: - Patient evaluated for thrombocytosis ranging between 421-741 since February 2013 - She is on baby aspirin  since MI on 10/08/2019 - JAK2 V617F test positive on 06/13/2022. - Risk stratification = Low risk (less than 44 years old, no history of DVT) - No indication for cytoreductive therapy at this time. Would consider cytoreductive therapy for uncontrolled PV associated symptoms, progressive increase of leukocytes and/or platelet counts, symptomatic or progressive splenomegaly, and poor tolerance  of phlebotomy. - No aquagenic pruritus/vasomotor symptoms/thrombosis/B symptoms - She does not have any splenomegaly on examination. - Receives intermittent phlebotomies. No results found for: CBC  There were no vitals filed for this visit.   Review of Systems  Constitutional:  Positive for malaise/fatigue. Negative for weight loss.  Respiratory:  Positive for shortness of breath.   Gastrointestinal:  Positive for constipation.  Neurological:  Positive for tingling, sensory change and headaches.  Psychiatric/Behavioral:  Negative for depression. The patient is not nervous/anxious.     Physical Exam Neurological:     Mental Status: She is alert and oriented to person, place, and time.     I provided 20 minutes of non face-to-face telephone visit time during this encounter, and > 50% was spent counseling as documented under my assessment & plan.   Carmen Hope, Carmen Edwards 01/09/2025 11:16 AM "

## 2025-01-21 ENCOUNTER — Encounter: Payer: Self-pay | Admitting: Obstetrics and Gynecology

## 2025-01-21 ENCOUNTER — Ambulatory Visit: Payer: BC Managed Care – PPO | Admitting: Obstetrics and Gynecology

## 2025-01-21 ENCOUNTER — Other Ambulatory Visit (HOSPITAL_COMMUNITY)
Admission: RE | Admit: 2025-01-21 | Discharge: 2025-01-21 | Disposition: A | Source: Ambulatory Visit | Attending: Obstetrics and Gynecology | Admitting: Obstetrics and Gynecology

## 2025-01-21 VITALS — BP 122/66 | HR 81 | Ht 65.0 in | Wt 220.0 lb

## 2025-01-21 DIAGNOSIS — Z124 Encounter for screening for malignant neoplasm of cervix: Secondary | ICD-10-CM

## 2025-01-21 DIAGNOSIS — Z01419 Encounter for gynecological examination (general) (routine) without abnormal findings: Secondary | ICD-10-CM

## 2025-01-21 DIAGNOSIS — R21 Rash and other nonspecific skin eruption: Secondary | ICD-10-CM

## 2025-01-21 DIAGNOSIS — R87612 Low grade squamous intraepithelial lesion on cytologic smear of cervix (LGSIL): Secondary | ICD-10-CM | POA: Diagnosis present

## 2025-01-21 DIAGNOSIS — N951 Menopausal and female climacteric states: Secondary | ICD-10-CM

## 2025-01-21 DIAGNOSIS — Z1331 Encounter for screening for depression: Secondary | ICD-10-CM | POA: Diagnosis not present

## 2025-01-21 NOTE — Progress Notes (Signed)
 Opened in error

## 2025-01-21 NOTE — Progress Notes (Signed)
 "  59 y.o. G44P1011 Married Caucasian female here for annual exam.    Seen for vulvar dermatitis in December.  Tx with hydrocortisone cream, which did not help.  Neg wet prep. Stopped shaving.   Still has redness of skin, but it does not itch or burn.  Does wear pads due to urinary incontinence.  Hx psoriasis in past.   Declines surgery for urinary incontinence.   No vaginal bleeding.  Hot flashes and night sweats are not as bad.   Dealing with low iron.  Sees oncology.   Feeling good overall.   PCP: Kip Righter, MD   No LMP recorded. (Menstrual status: IUD).           Sexually active: Yes.    The current method of family planning is IUD.  Mirena , 07/20/22  Menopausal hormone therapy:  NA Exercising: Yes.    Pt reports [place]  Smoker:  no  OB History  Gravida Para Term Preterm AB Living  2 1 1  1 1   SAB IAB Ectopic Multiple Live Births   1   1    # Outcome Date GA Lbr Len/2nd Weight Sex Type Anes PTL Lv  2 IAB 1988          1 Term 09/28/85 [redacted]w[redacted]d   M Vag-Spont   LIV     HEALTH MAINTENANCE: Last 2 paps:  01/16/24:  LGSIL, neg HR HPV, 06/17/21:  ASCUS, neg HR HPV. History of abnormal Pap or positive HPV:  yes Mammogram:   12/256/25:  BI-RADS1 Colonoscopy:  2014 - [facility].   Done August, 2025 - Dr. Donnald - normal - due in 2030.    Bone Density:  NA  Immunization History  Administered Date(s) Administered   Influenza,inj,Quad PF,6+ Mos 09/23/2016   Influenza-Unspecified 09/19/2019   Moderna Sars-Covid-2 Vaccination 03/16/2020, 04/13/2020      reports that she quit smoking about 5 years ago. Her smoking use included cigarettes. She has never used smokeless tobacco. She reports that she does not drink alcohol and does not use drugs.  Past Medical History:  Diagnosis Date   Abnormal Pap smear of cervix    Anemia    Coronary artery disease    Essential hypertension    Family history of adverse reaction to anesthesia    mother- has  proble,ms with N/v   GERD (gastroesophageal reflux disease)    History of colonic polyps    History of migraine    Occasional, menustral cycle related   Hx of chlamydia infection 1989   Hyperlipidemia    Polycythemia vera (HCC) 04/07/2023   PV (polycythemia vera) (HCC) 05/26/2022   STEMI (ST elevation myocardial infarction) (HCC)    10/08/19 PCI/DESx1 to pLAD    Past Surgical History:  Procedure Laterality Date   BREAST BIOPSY Right    BREAST SURGERY  01/2016   Rt.breast bx--?PASH   COLONOSCOPY WITH PROPOFOL  N/A 11/28/2014   Procedure: COLONOSCOPY WITH PROPOFOL ;  Surgeon: Lamar Donnald GAILS, MD;  Location: [facility];  Service: Endoscopy;  Laterality: N/A;   CORONARY/GRAFT ACUTE MI REVASCULARIZATION N/A 10/08/2019   Procedure: Coronary/Graft Acute MI Revascularization;  Surgeon: Verlin Lonni BIRCH, MD;  [facility];  Service: Cardiovascular;  Laterality: N/A;   DILATATION & CURETTAGE/HYSTEROSCOPY WITH MYOSURE N/A 06/23/2022   Procedure: DILATATION & CURETTAGE/HYSTEROSCOPY WITH MYOSURE FIBROID RESECTION;  Surgeon: Cathlyn JAYSON Nikki Bobie FORBES, MD;  Location: [facility];  Service: Gynecology;  Laterality: N/A;  Fibroid resection  01/2012   Dr. Mandy   HYSTEROSCOPY  01/2012   Dr. Mandy   LEFT HEART CATH AND CORONARY ANGIOGRAPHY N/A 10/08/2019   Procedure: LEFT HEART CATH AND CORONARY ANGIOGRAPHY;  Surgeon: Verlin Lonni BIRCH, MD;  [facility];  Service: Cardiovascular;  Laterality: N/A;   SKIN LESION EXCISION     --actually frozen by dermatology--benign   TOE SURGERY     Toe nail removed    Current Outpatient Medications  Medication Sig Dispense Refill   acetaminophen  (TYLENOL ) 500 MG tablet Take 500 mg by mouth every 6 (six) hours as needed.     atenolol  (TENORMIN ) 50 MG tablet Take 50 mg by mouth daily with lunch.      atorvastatin  (LIPITOR ) 80 MG tablet TAKE 1 TABLET BY MOUTH AT BEDTIME 90 tablet 3   BAYER ASPIRIN  EC LOW DOSE 81 MG EC tablet  Take 1 tablet by mouth daily.     cholecalciferol (VITAMIN D) 1000 units tablet Take 2,000 Units by mouth daily.      Cyanocobalamin (VITAMIN B-12) 2500 MCG SUBL Take 2,500 mcg by mouth daily.     docusate sodium (COLACE) 100 MG capsule Take 100 mg by mouth daily as needed for mild constipation.     ketoconazole (NIZORAL) 2 % shampoo Apply 1 Application topically daily as needed for irritation.     Lactobacillus (PROBIOTIC ACIDOPHILUS PO) Take by mouth as needed.     levonorgestrel  (MIRENA ) 20 MCG/DAY IUD 1 each by Intrauterine route once.     nitroGLYCERIN  (NITROSTAT ) 0.4 MG SL tablet Place 1 tablet (0.4 mg total) under the tongue every 5 (five) minutes x 3 doses as needed for chest pain (if no relief after 3rd dose, proceed to ED or call 911). 25 tablet 3   omeprazole (PRILOSEC) 20 MG capsule Take 20 mg by mouth daily with lunch.      No current facility-administered medications for this visit.    ALLERGIES: Amoxicillin  Family History  Problem Relation Age of Onset   Cancer Mother        multiple myeloma   Hyperlipidemia Mother    Macular degeneration Mother    Cancer - Colon Father    Hypertension Father    Hyperlipidemia Father    Heart disease Father    Hypertension Brother    Heart disease Brother 62       Heart attack   Cancer - Colon Maternal Aunt    Cancer - Colon Maternal Uncle    Cancer - Colon Maternal Grandmother    Heart disease Paternal Grandfather     Review of Systems  All other systems reviewed and are negative.   PHYSICAL EXAM:  BP 122/66 (BP Location: Left Arm, Patient Position: Sitting, Cuff Size: Normal)   Pulse 81   Ht 5' 5 (1.651 m)   Wt 220 lb (99.8 kg)   SpO2 97%   BMI 36.61 kg/m     General appearance: alert, cooperative and appears stated age Head: normocephalic, without obvious abnormality, atraumatic Neck: no adenopathy, supple, symmetrical, trachea midline and thyroid  normal to inspection and palpation Lungs: clear to auscultation  bilaterally Breasts: normal appearance, no masses or tenderness, No nipple retraction or dimpling, No nipple discharge or bleeding, No axillary adenopathy Heart: regular rate and rhythm Abdomen: soft, non-tender; no masses, no organomegaly Extremities: extremities normal, atraumatic, no cyanosis or edema Skin: skin color, texture, turgor normal. No rashes or lesions Lymph nodes: cervical, supraclavicular, and axillary nodes normal.  Neurologic: grossly normal  Pelvic: External genitalia:   Vulvar rash bilateral labia majora/minora and left inferior labia majora patches.              No abnormal inguinal nodes palpated.              Urethra:  normal appearing urethra with no masses, tenderness or lesions              Bartholins and Skenes: normal                 Vagina: normal appearing vagina with normal color and discharge, no lesions              Cervix: no lesions.  IUD strings noted.               Pap taken: yes Bimanual Exam:  Uterus:  normal size, contour, position, consistency, mobility, non-tender              Adnexa: no mass, fullness, tenderness              Rectal exam: yes.  Confirms.              Anus:  normal sphincter tone, no lesions  Chaperone was present for exam:  Geni CROME, CMA  ASSESSMENT: Well woman visit with gynecologic exam. Mirena  IUD. Cervical cancer screening. LGSIL pap and negative HR HPV. Hx left vaginal apical polyp.  Biopsy benign.  Stress incontinence.  Menopausal symptoms.  Vulvar rash.  Hx psoriasis.  PHQ-2-9: 0 Hx MI. Thrombocytosis.  Polycythemia.  Currently low iron. FH colon cancer.   PLAN: Mammogram screening discussed. Self breast awareness reviewed. Pap and HRV collected:  yes Guidelines for Calcium , Vitamin D, regular exercise program including cardiovascular and weight bearing exercise. Medication refills:  NA FSH and estradiol . Follow up:  annual exam and also for vulvar biopsy.  Rationale for vulvar biopsy explained.             "

## 2025-01-21 NOTE — Patient Instructions (Signed)

## 2025-01-22 LAB — FOLLICLE STIMULATING HORMONE: FSH: 66.2 m[IU]/mL

## 2025-01-22 LAB — CYTOLOGY - PAP
Comment: NEGATIVE
Diagnosis: UNDETERMINED — AB
High risk HPV: NEGATIVE

## 2025-01-24 ENCOUNTER — Ambulatory Visit: Payer: Self-pay | Admitting: Obstetrics and Gynecology

## 2025-01-24 DIAGNOSIS — R87610 Atypical squamous cells of undetermined significance on cytologic smear of cervix (ASC-US): Secondary | ICD-10-CM

## 2025-01-26 LAB — ESTRADIOL, ULTRA SENS: Estradiol, Ultra Sensitive: 3 pg/mL

## 2025-02-14 ENCOUNTER — Inpatient Hospital Stay

## 2025-02-14 ENCOUNTER — Inpatient Hospital Stay: Admitting: Oncology

## 2025-02-14 ENCOUNTER — Inpatient Hospital Stay: Attending: Hematology

## 2025-02-26 ENCOUNTER — Encounter: Admitting: Obstetrics and Gynecology

## 2025-03-13 ENCOUNTER — Ambulatory Visit: Admitting: Obstetrics and Gynecology

## 2025-04-15 ENCOUNTER — Inpatient Hospital Stay

## 2025-04-15 ENCOUNTER — Inpatient Hospital Stay: Attending: Hematology | Admitting: Oncology

## 2026-01-22 ENCOUNTER — Ambulatory Visit: Admitting: Obstetrics and Gynecology
